# Patient Record
Sex: Male | Born: 1985
Health system: Southern US, Community
[De-identification: ages and names within clinical notes are randomized; demographics above are authoritative.]

## PROBLEM LIST (undated history)

## (undated) DIAGNOSIS — I10 Essential (primary) hypertension: Secondary | ICD-10-CM

## (undated) HISTORY — DX: Essential (primary) hypertension: I10

---

## 2013-11-25 ENCOUNTER — Emergency Department (HOSPITAL_BASED_OUTPATIENT_CLINIC_OR_DEPARTMENT_OTHER)
Admission: EM | Admit: 2013-11-25 | Discharge: 2013-11-25 | Disposition: A | Discharge status: Home / Self Care | Payer: Self-pay | Attending: Emergency Medicine | Admitting: Emergency Medicine

## 2013-11-25 ENCOUNTER — Encounter (HOSPITAL_BASED_OUTPATIENT_CLINIC_OR_DEPARTMENT_OTHER): Payer: Self-pay | Admitting: Emergency Medicine

## 2013-11-25 DIAGNOSIS — H109 Unspecified conjunctivitis: Secondary | ICD-10-CM | POA: Insufficient documentation

## 2013-11-25 DIAGNOSIS — F17200 Nicotine dependence, unspecified, uncomplicated: Secondary | ICD-10-CM | POA: Insufficient documentation

## 2013-11-25 MED ORDER — CIPROFLOXACIN HCL 0.3 % OP SOLN
1.0000 [drp] | OPHTHALMIC | Status: DC
  Administered 2013-11-25: 1 [drp] via OPHTHALMIC
  Filled 2013-11-25: qty 2.5

## 2013-11-25 MED ORDER — ERYTHROMYCIN 5 MG/GM OP OINT
TOPICAL_OINTMENT | Freq: Every day | OPHTHALMIC | Status: DC
  Administered 2013-11-25: 13:00:00 via OPHTHALMIC
  Filled 2013-11-25: qty 3.5

## 2013-11-25 MED ORDER — TETRACAINE HCL 0.5 % OP SOLN: 1.0000 [drp] | Freq: Once | OPHTHALMIC | Status: DC

## 2013-11-25 MED ORDER — FLUORESCEIN SODIUM 1 MG OP STRP
1.0000 | ORAL_STRIP | Freq: Once | OPHTHALMIC | Status: AC
  Administered 2013-11-25: 1 via OPHTHALMIC
  Filled 2013-11-25: qty 1

## 2013-11-25 MED ORDER — FLUORESCEIN SODIUM 1 MG OP STRP: 1.0000 | ORAL_STRIP | Freq: Once | OPHTHALMIC | Status: DC

## 2013-11-25 MED ORDER — TETRACAINE HCL 0.5 % OP SOLN
1.0000 [drp] | Freq: Once | OPHTHALMIC | Status: AC
  Administered 2013-11-25: 1 [drp] via OPHTHALMIC
  Filled 2013-11-25: qty 2

## 2013-11-25 NOTE — Discharge Instructions (Signed)

## 2013-11-25 NOTE — ED Notes (Signed)
Patient began with reddness and swelling in his right eye on Monday, that has now spread to his left eye. Both eyes swollen, red with clear drainage. Right eye worse symptoms than the left

## 2013-11-25 NOTE — ED Provider Notes (Addendum)
CSN: [id number]     Arrival date & time 11/25/13  1133 History   First MD Initiated Contact with Patient 11/25/13 1156     Chief Complaint  Patient presents with  . Conjunctivitis     (Consider location/radiation/quality/duration/timing/severity/associated sxs/prior Treatment) Patient is a 28 y.o. male presenting with conjunctivitis. The history is provided by the patient.  Conjunctivitis This is a new problem. Episode onset: 6 days ago. The problem occurs constantly. The problem has been gradually worsening. Associated symptoms comments: Started with pain and irritation to the right eye with watering and matting and then spread to left eye yesterday.  Does not wear contacts and vision has not changed.. Nothing aggravates the symptoms. Nothing relieves the symptoms. He has tried nothing for the symptoms. The treatment provided no relief.    History reviewed. No pertinent past medical history. History reviewed. No pertinent past surgical history. No family history on file. History  Substance Use Topics  . Smoking status: Current Every Day Smoker  . Smokeless tobacco: Not on file  . Alcohol Use: No    Review of Systems  Eyes: Positive for pain, discharge, redness and itching. Negative for visual disturbance.  All other systems reviewed and are negative.      Allergies  Review of patient's allergies indicates no known allergies.  Home Medications  No current outpatient prescriptions on file. BP 144/98  Pulse 82  Temp(Src) 98.4 F (36.9 C) (Oral)  Resp 18  Ht 5\' 6"  (1.676 m)  Wt 170 lb (77.111 kg)  BMI 27.45 kg/m2  SpO2 100% Physical Exam  Nursing note and vitals reviewed. Constitutional: He is oriented to person, place, and time. He appears well-developed and well-nourished. No distress.  HENT:  Head: Normocephalic and atraumatic.  Eyes: EOM are normal. Pupils are equal, round, and reactive to light. Right eye exhibits discharge and exudate. No foreign body present in  the right eye. Left eye exhibits discharge and exudate. Right conjunctiva is injected. Right conjunctiva has no hemorrhage. Left conjunctiva is injected. Left conjunctiva has no hemorrhage.  Slit lamp exam:      The right eye shows no corneal abrasion, no corneal flare, no corneal ulcer and no foreign body.  Right upper lid with swelling  Cardiovascular: Normal rate.   Pulmonary/Chest: Effort normal.  Neurological: He is alert and oriented to person, place, and time.  Skin: Skin is warm and dry.  Psychiatric: He has a normal mood and affect. His behavior is normal.    ED Course  Procedures (including critical care time) Labs Review Labs Reviewed - No data to display Imaging Review No results found.   EKG Interpretation None      MDM   Final diagnoses:  Conjunctivitis    Patient with evidence of bilateral conjunctivitis without complicating features. Patient does not wear contacts  and otherwise has normal vision. He was started on antibiotic drops Hemoccult with ophthalmology if symptoms do not improve.    Gwyneth SproutWhitney Avyanna Spada, MD 11/25/13 1227  Gwyneth SproutWhitney Neeva Trew, MD 11/25/13 1229

## 2013-12-01 ENCOUNTER — Emergency Department (HOSPITAL_BASED_OUTPATIENT_CLINIC_OR_DEPARTMENT_OTHER): Payer: Self-pay

## 2013-12-01 ENCOUNTER — Encounter (HOSPITAL_BASED_OUTPATIENT_CLINIC_OR_DEPARTMENT_OTHER): Payer: Self-pay | Admitting: Emergency Medicine

## 2013-12-01 ENCOUNTER — Emergency Department (HOSPITAL_BASED_OUTPATIENT_CLINIC_OR_DEPARTMENT_OTHER)
Admission: EM | Admit: 2013-12-01 | Discharge: 2013-12-01 | Disposition: A | Discharge status: Home / Self Care | Payer: Self-pay | Attending: Emergency Medicine | Admitting: Emergency Medicine

## 2013-12-01 DIAGNOSIS — F17200 Nicotine dependence, unspecified, uncomplicated: Secondary | ICD-10-CM | POA: Insufficient documentation

## 2013-12-01 DIAGNOSIS — S43006A Unspecified dislocation of unspecified shoulder joint, initial encounter: Secondary | ICD-10-CM | POA: Insufficient documentation

## 2013-12-01 DIAGNOSIS — H109 Unspecified conjunctivitis: Secondary | ICD-10-CM | POA: Insufficient documentation

## 2013-12-01 IMAGING — CR DG SHOULDER 2+V*R*
3 series · 3 of 3 positions shown · non-contrast
Comparison: None.

CLINICAL DATA: Shoulder pain and limited range of movement.

EXAM:
RIGHT SHOULDER - 2+ VIEW

[w shoulder ap internal righ]
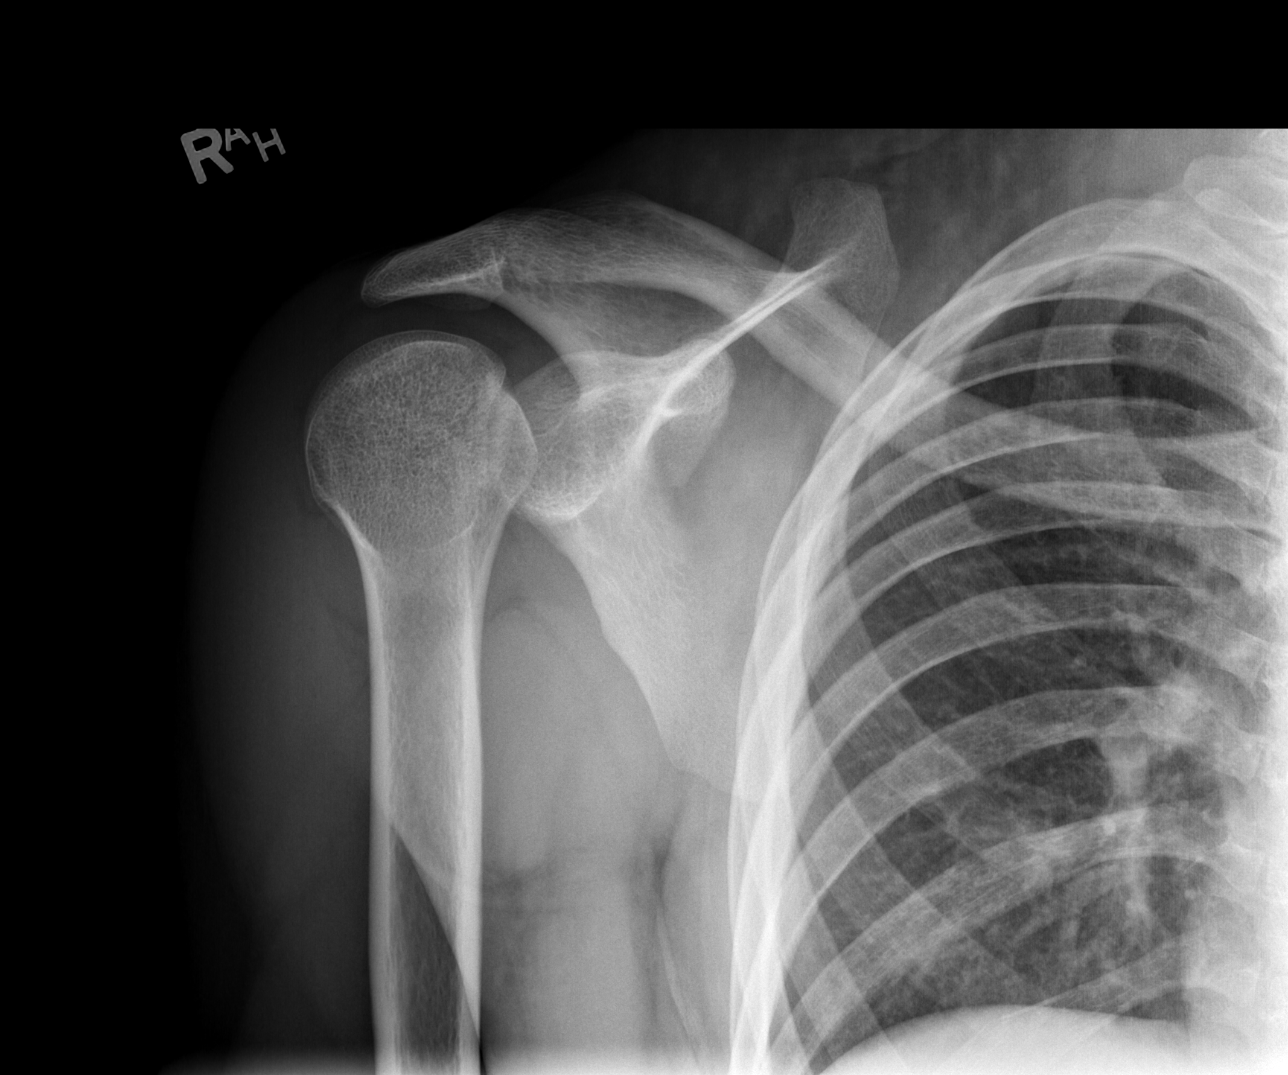

[w shoulder ap external righ (1 of 2)]
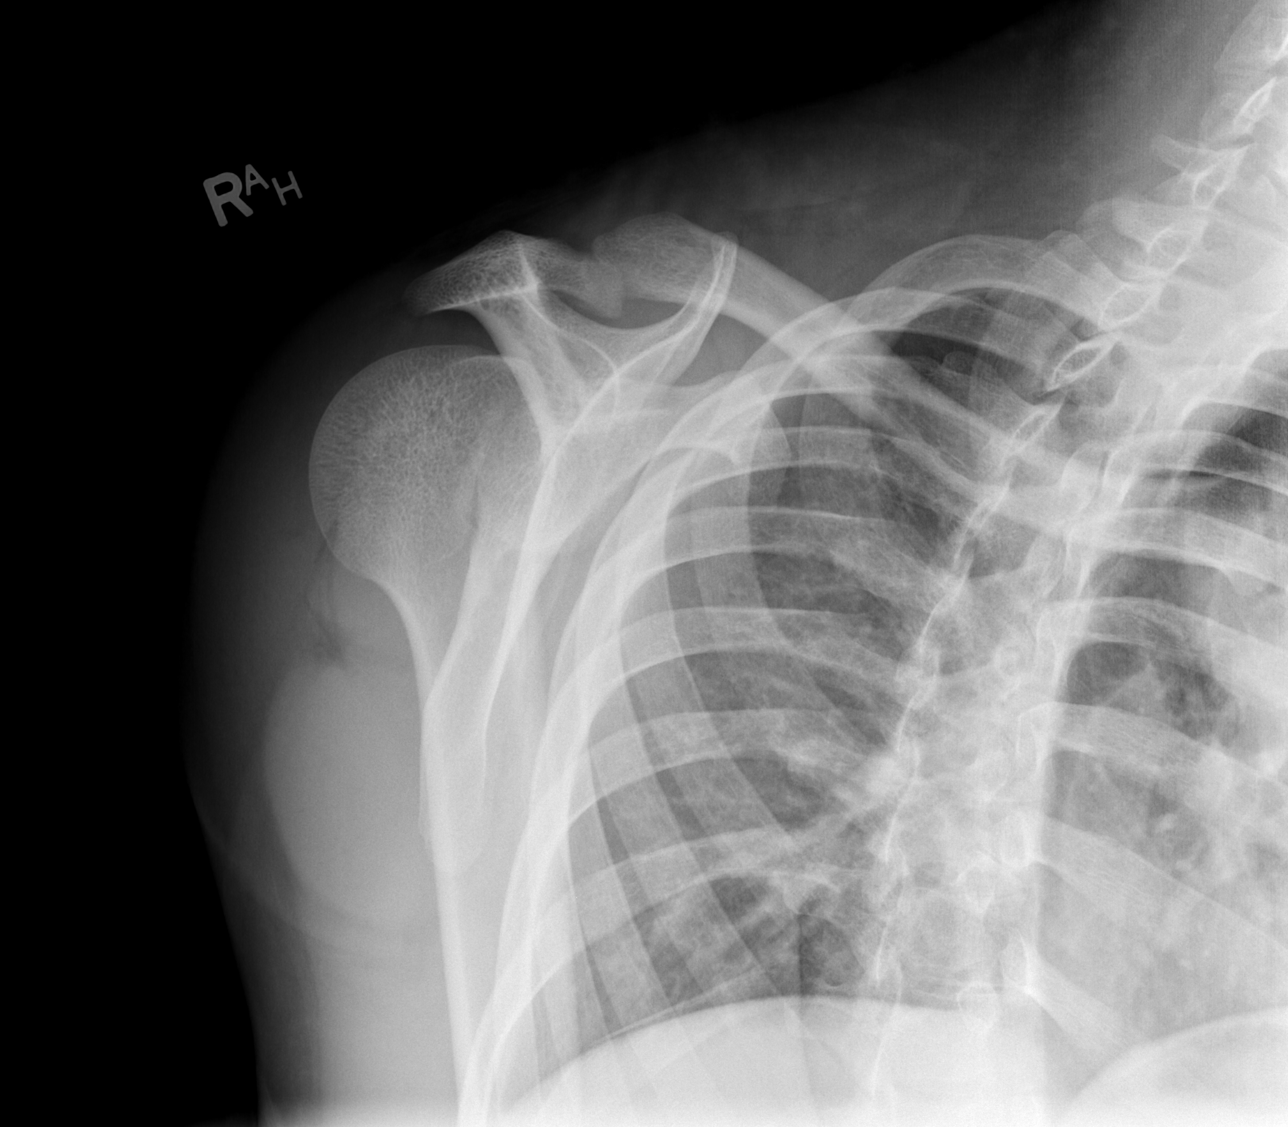

[w shoulder ap external righ (2 of 2)]
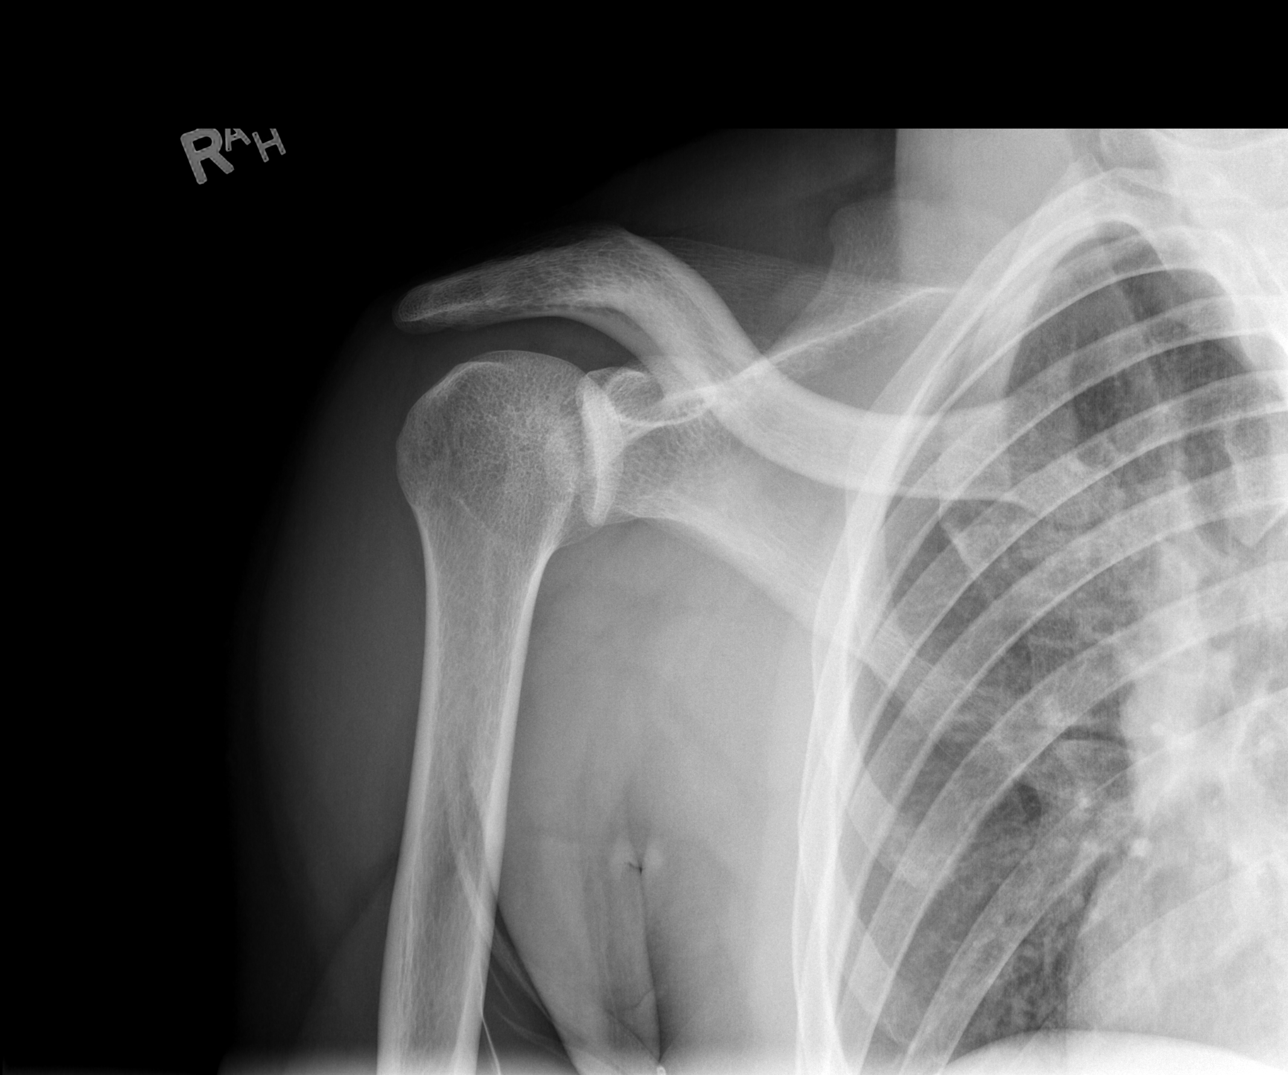

[3 of 3 positions shown; findings below may reference images not displayed]

FINDINGS: The humeral head is posteriorly dislocated. There is likely a
reverse Hill-Sachs lesion from impaction on the glenoid. The humerus
is in internal rotation. Acromioclavicular joint is approximated.
IMPRESSION: Posterior glenohumeral dislocation.

## 2013-12-01 IMAGING — CR DG SHOULDER 2+V*R*
2 series · 2 of 2 positions shown · non-contrast
Comparison: Same day.

CLINICAL DATA: Post reduction for dislocation.

EXAM:
RIGHT SHOULDER - 2+ VIEW

[view not recorded (1 of 2)]
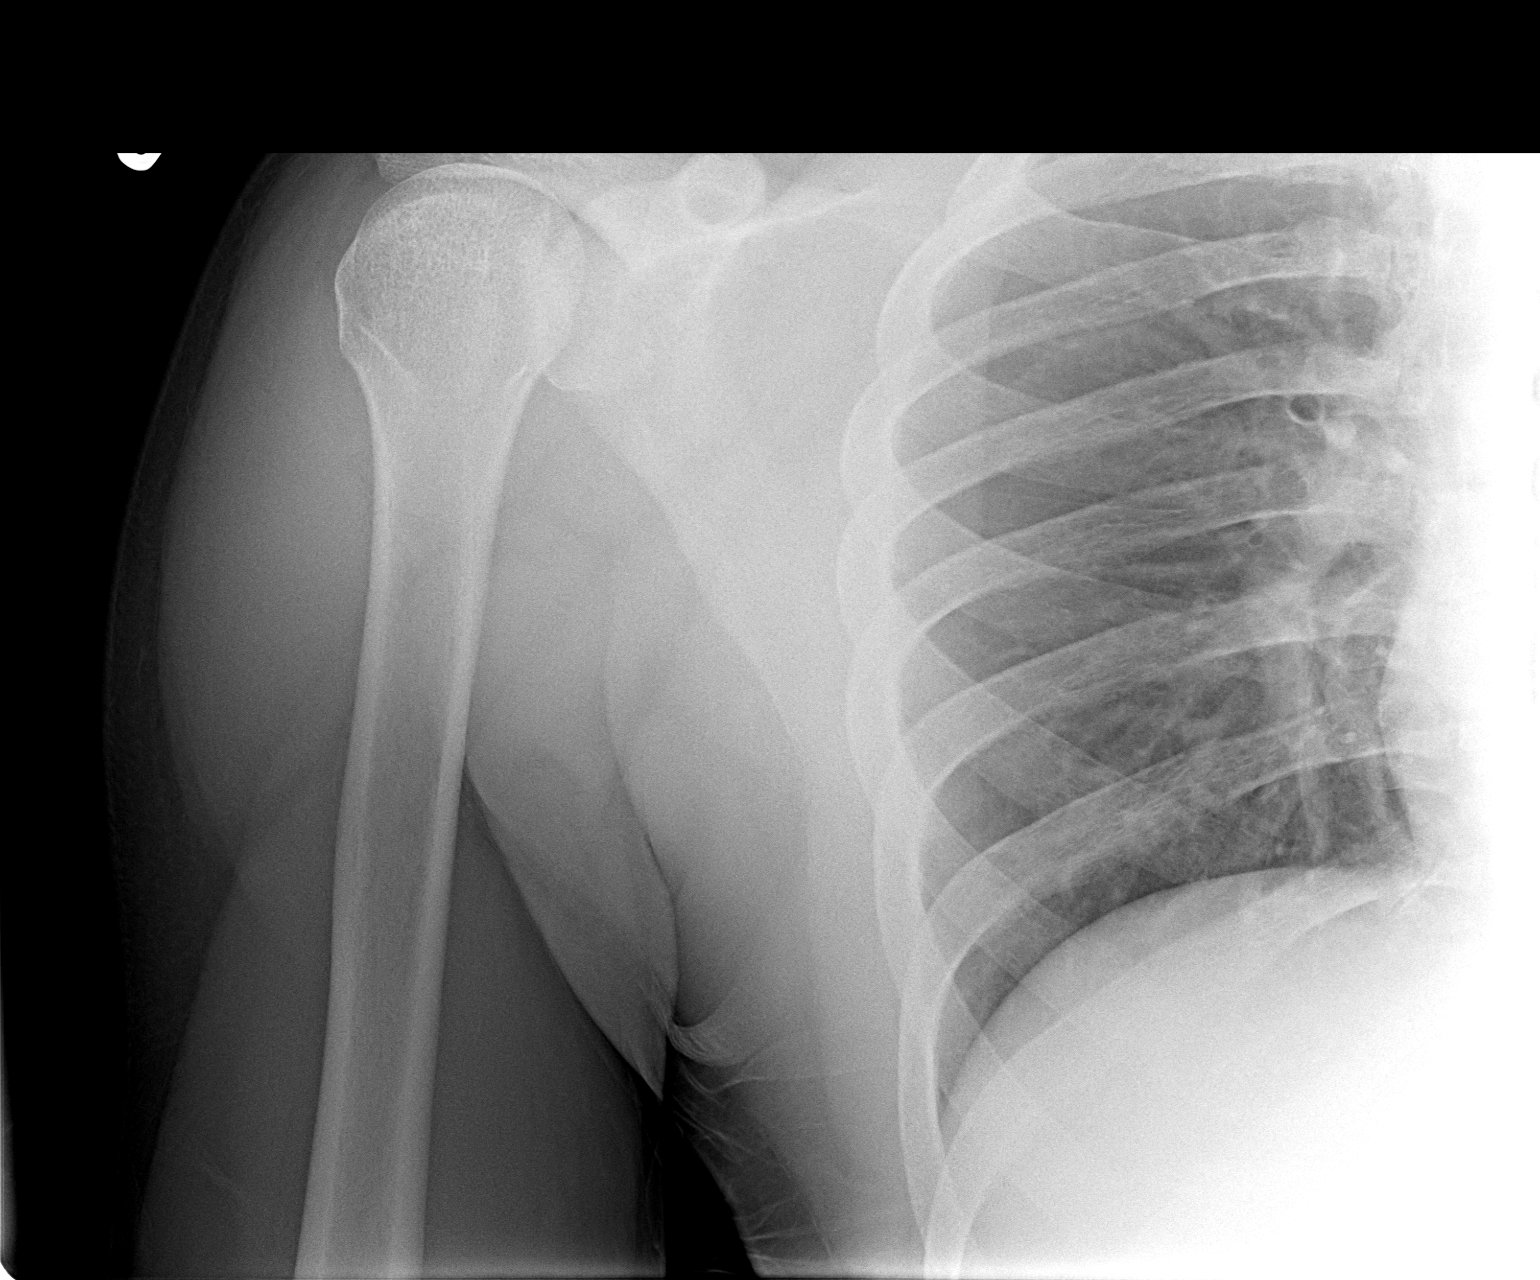

[view not recorded (2 of 2)]
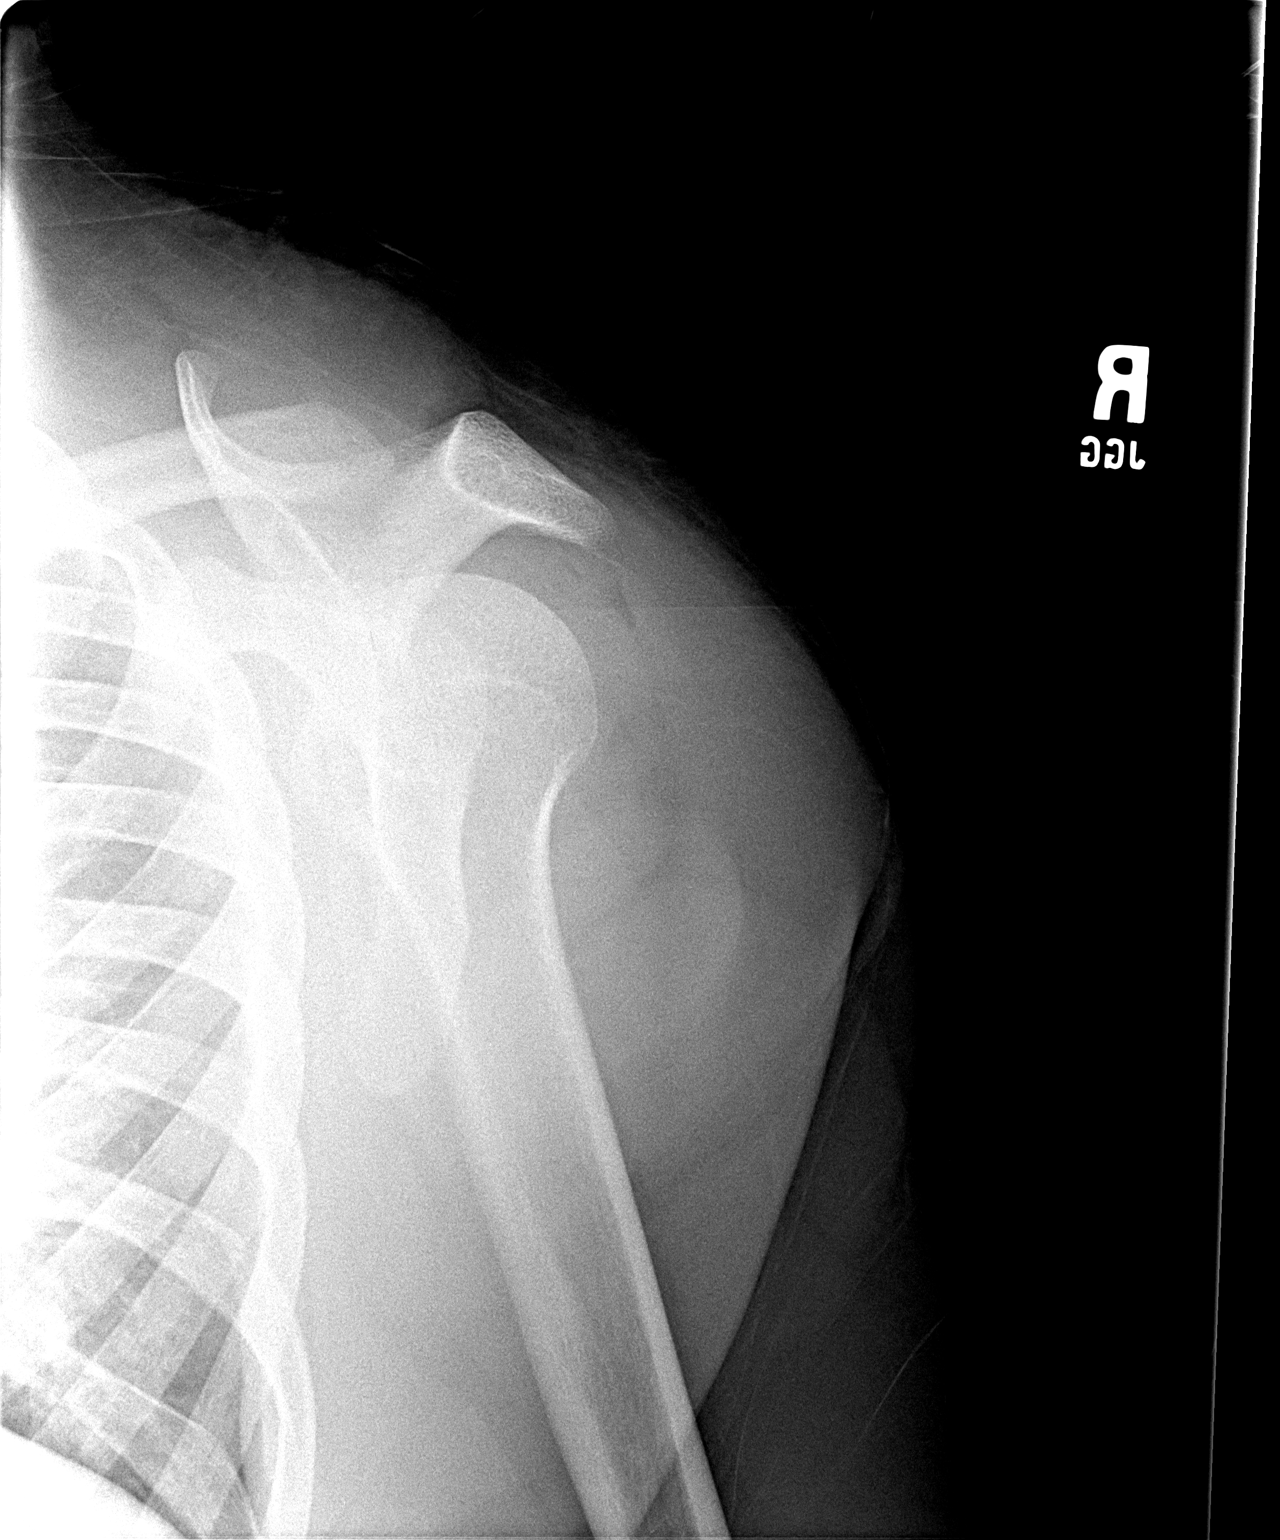

[2 of 2 positions shown; findings below may reference images not displayed]

FINDINGS: There is successful reduction of the previously described posterior
dislocation. No definite fracture is noted. Visualized ribs appear
normal.
IMPRESSION: Successful reduction of previously described posterior dislocation
of right proximal humeral head.

## 2013-12-01 MED ORDER — HYDROMORPHONE HCL PF 1 MG/ML IJ SOLN
1.0000 mg | Freq: Once | INTRAMUSCULAR | Status: AC
  Administered 2013-12-01: 1 mg via INTRAVENOUS
  Filled 2013-12-01: qty 1

## 2013-12-01 MED ORDER — ERYTHROMYCIN 5 MG/GM OP OINT
TOPICAL_OINTMENT | Freq: Four times a day (QID) | OPHTHALMIC | Status: DC
  Administered 2013-12-01: 1 via OPHTHALMIC
  Filled 2013-12-01: qty 3.5

## 2013-12-01 MED ORDER — IBUPROFEN 800 MG PO TABS: 800.0000 mg | ORAL_TABLET | Freq: Three times a day (TID) | ORAL | Status: DC

## 2013-12-01 MED ORDER — HYDROCODONE-ACETAMINOPHEN 5-325 MG PO TABS: 1.0000 | ORAL_TABLET | ORAL | Status: DC | PRN

## 2013-12-01 MED ORDER — PROPOFOL 10 MG/ML IV BOLUS
35.0000 mg | INTRAVENOUS | Status: DC | PRN
  Administered 2013-12-01: 35 mg via INTRAVENOUS
  Filled 2013-12-01: qty 20
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
## 2013-12-01 NOTE — ED Provider Notes (Signed)
Patient presented to the emergency room for evaluation of right shoulder injury. The patient was seen initially by PA Upstill.  X-rays demonstrated a posterior shoulder dislocation. Patient denies any active medical problems. He has no history of heart or lung disease. He's not having any difficulty with his breathing. No chest pain Physical Exam  BP 135/89  Pulse 89  Temp(Src) 98 F (36.7 C) (Oral)  Resp 24  Ht 5\' 7"  (1.702 m)  Wt 170 lb (77.111 kg)  BMI 26.62 kg/m2  SpO2 100%  Physical Exam  Nursing note and vitals reviewed. Constitutional: He appears well-developed and well-nourished. No distress.  HENT:  Head: Normocephalic and atraumatic.  Right Ear: External ear normal.  Left Ear: External ear normal.  Mouth/Throat: No oropharyngeal exudate.  Eyes: Conjunctivae are normal. Right eye exhibits no discharge. Left eye exhibits no discharge. No scleral icterus.  Neck: Neck supple. No tracheal deviation present.  Cardiovascular: Normal rate and regular rhythm.  Exam reveals no friction rub.   Pulmonary/Chest: Effort normal and breath sounds normal. No stridor. No respiratory distress. He has no wheezes.  Musculoskeletal: He exhibits no edema.  Neurological: He is alert. Cranial nerve deficit: no gross deficits.  Skin: Skin is warm and dry. No rash noted.  Psychiatric: He has a normal mood and affect.    ED Course  Reduction of dislocation Date/Time: 12/01/2013 4:42 PM Performed by: Linwood Dibbles R Authorized by: Linwood Dibbles R Consent: Verbal consent obtained. Risks and benefits: risks, benefits and alternatives were discussed Consent given by: patient Local anesthesia used: no Patient sedated: yes Patient tolerance: Patient tolerated the procedure well with no immediate complications. Comments: Traction counter traction with posterior pressure on the proximal humerus.  Palpable reduction felt.  Tolerated well.  Procedural sedation Date/Time: 12/01/2013 4:43 PM Performed by:  Linwood Dibbles R Authorized by: Linwood Dibbles R Consent: Verbal consent obtained. Risks and benefits: risks, benefits and alternatives were discussed Consent given by: patient Patient sedated: yes Sedatives: propofol Sedation end date/time: 12/01/2013 4:43 PM Comments: See nursing record for start and stop times.  Approximately 15 minutes of bedside time.  Pt was awake and speaking to me at the end of the procedure.  Mallampati class I. ASA category 1.  See nursing sedation notes for medication information.  Propofol used in 0.5 mg/kg aliquots. Dg Shoulder Right  12/01/2013   CLINICAL DATA:  Post reduction for dislocation.  EXAM: RIGHT SHOULDER - 2+ VIEW  COMPARISON:  Same day.  FINDINGS: There is successful reduction of the previously described posterior dislocation. No definite fracture is noted. Visualized ribs appear normal.  IMPRESSION: Successful reduction of previously described posterior dislocation of right proximal humeral head.   Electronically Signed   By: Roque Lias M.D.   On: 12/01/2013 16:59   Dg Shoulder Right  12/01/2013   CLINICAL DATA:  Shoulder pain and limited range of movement.  EXAM: RIGHT SHOULDER - 2+ VIEW  COMPARISON:  None.  FINDINGS: The humeral head is posteriorly dislocated. There is likely a reverse Hill-Sachs lesion from impaction on the glenoid. The humerus is in internal rotation. Acromioclavicular joint is approximated.  IMPRESSION: Posterior glenohumeral dislocation.   Electronically Signed   By: Sebastian Ache   On: 12/01/2013 15:14    MDM Post reduction films showed successful reduction.  Will dc home with ortho referral.  Medical screening examination/treatment/procedure(s) were conducted as a shared visit with non-physician practitioner(s) and myself.  I personally evaluated the patient during the encounter.  Celene KrasJon R Jenavee Laguardia, MD 12/01/13 [phone]

## 2013-12-01 NOTE — ED Notes (Signed)
Pt had a vagal episode at triage. Brought straight back to room 8. IV access. Xray ordered. Pt is laying flat. Skin is less diaphoretic after laying down. Able to talk on cell phone.
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
## 2013-12-01 NOTE — Discharge Instructions (Signed)
Shoulder Dislocation  Your shoulder is made up of three bones: the collar bone (clavicle); the shoulder blade (scapula), which includes the socket (glenoid cavity); and the upper arm bone (humerus). Your shoulder joint is the place where these bones meet. Strong, fibrous tissues hold these bones together (ligaments). Muscles and strong, fibrous tissues that connect the muscles to these bones (tendons) allow your arm to move through this joint. The range of motion of your shoulder joint is more extensive than most of your other joints, and the glenoid cavity is very shallow. That is the reason that your shoulder joint is one of the most unstable joints in your body. It is far more prone to dislocation than your other joints. Shoulder dislocation is when your humerus is forced out of your shoulder joint.  CAUSES  Shoulder dislocation is caused by a forceful impact on your shoulder. This impact usually is from an injury, such as a sports injury or a fall.  SYMPTOMS  Symptoms of shoulder dislocation include:   Deformity of your shoulder.   Intense pain.   Inability to move your shoulder joint.   Numbness, weakness, or tingling around your shoulder joint (your neck or down your arm).   Bruising or swelling around your shoulder.  DIAGNOSIS  In order to diagnose a dislocated shoulder, your caregiver will perform a physical exam. Your caregiver also may have an X-ray exam done to see if you have any broken bones. Magnetic resonance imaging (MRI) is a procedure that sometimes is done to help your caregiver see any damage to the soft tissues around your shoulder, particularly your rotator cuff tendons. Additionally, your caregiver also may have electromyography done to measure the electrical discharges produced in your muscles if you have signs or symptoms of nerve damage.  TREATMENT  A shoulder dislocation is treated by placing the humerus back in the joint (reduction). Your caregiver does this either manually (closed  reduction), by moving your humerus back into the joint through manipulation, or through surgery (open reduction). When your humerus is back in place, severe pain should improve almost immediately.  You also may need to have surgery if you have a weak shoulder joint or ligaments, and you have recurring shoulder dislocations, despite rehabilitation. In rare cases, surgery is necessary if your nerves or blood vessels are damaged during the dislocation.  After your reduction, your arm will be placed in a shoulder immobilizer or sling to keep it from moving. Your caregiver will have you wear your shoulder immoblizer or sling for 3 days to 3 weeks, depending on how serious your dislocation is. When your shoulder immobilizer or sling is removed, your caregiver may prescribe physical therapy to help improve the range of motion in your shoulder joint.  HOME CARE INSTRUCTIONS   The following measures can help to reduce pain and speed up the healing process:   Rest your injured joint. Do not move it. Avoid activities similar to the one that caused your injury.   Apply ice to your injured joint for the first day or two after your reduction or as directed by your caregiver. Applying ice helps to reduce inflammation and pain.   Put ice in a plastic bag.   Place a towel between your skin and the bag.   Leave the ice on for 15-20 minutes at a time, every 2 hours while you are awake.   Exercise your hand by squeezing a soft ball. This helps to eliminate stiffness and swelling in your hand and   shoulder immobilizer or sling becomes damaged.  Your pain becomes worse rather than better.  You lose feeling in your arm or hand, or they become white and cold. MAKE SURE YOU:   Understand these instructions.  Will watch your condition.  Will get help right away if you are not  doing well or get worse. Document Released: 05/05/2001 Document Revised: 11/02/2011 Document Reviewed: 05/31/2011 [facility] Patient Information 2014 [place], [place]. Cryotherapy Cryotherapy means treatment with cold. Ice or gel packs can be used to reduce both pain and swelling. Ice is the most helpful within the first 24 to 48 hours after an injury or flareup from overusing a muscle or joint. Sprains, strains, spasms, burning pain, shooting pain, and aches can all be eased with ice. Ice can also be used when recovering from surgery. Ice is effective, has very few side effects, and is safe for most people to use. PRECAUTIONS  Ice is not a safe treatment option for people with:  Raynaud's phenomenon. This is a condition affecting small blood vessels in the extremities. Exposure to cold may cause your problems to return.  Cold hypersensitivity. There are many forms of cold hypersensitivity, including:  Cold urticaria. Red, itchy hives appear on the skin when the tissues begin to warm after being iced.  Cold erythema. This is a red, itchy rash caused by exposure to cold.  Cold hemoglobinuria. Red blood cells break down when the tissues begin to warm after being iced. The hemoglobin that carry oxygen are passed into the urine because they cannot combine with blood proteins fast enough.  Numbness or altered sensitivity in the area being iced. If you have any of the following conditions, do not use ice until you have discussed cryotherapy with your caregiver:  Heart conditions, such as arrhythmia, angina, or chronic heart disease.  High blood pressure.  Healing wounds or open skin in the area being iced.  Current infections.  Rheumatoid arthritis.  Poor circulation.  Diabetes. Ice slows the blood flow in the region it is applied. This is beneficial when trying to stop inflamed tissues from spreading irritating chemicals to surrounding tissues. However, if you expose your skin to cold  temperatures for too long or without the proper protection, you can damage your skin or nerves. Watch for signs of skin damage due to cold. HOME CARE INSTRUCTIONS Follow these tips to use ice and cold packs safely.  Place a dry or damp towel between the ice and skin. A damp towel will cool the skin more quickly, so you may need to shorten the time that the ice is used.  For a more rapid response, add gentle compression to the ice.  Ice for no more than 10 to 20 minutes at a time. The bonier the area you are icing, the less time it will take to get the benefits of ice.  Check your skin after 5 minutes to make sure there are no signs of a poor response to cold or skin damage.  Rest 20 minutes or more in between uses.  Once your skin is numb, you can end your treatment. You can test numbness by very lightly touching your skin. The touch should be so light that you do not see the skin dimple from the pressure of your fingertip. When using ice, most people will feel these normal sensations in this order: cold, burning, aching, and numbness.  Do not use ice on someone who cannot communicate their responses to pain, such as small children or people  with dementia. HOW TO MAKE AN ICE PACK Ice packs are the most common way to use ice therapy. Other methods include ice massage, ice baths, and cryo-sprays. Muscle creams that cause a cold, tingly feeling do not offer the same benefits that ice offers and should not be used as a substitute unless recommended by your caregiver. To make an ice pack, do one of the following:  Place crushed ice or a bag of frozen vegetables in a sealable plastic bag. Squeeze out the excess air. Place this bag inside another plastic bag. Slide the bag into a pillowcase or place a damp towel between your skin and the bag.  Mix 3 parts water with 1 part rubbing alcohol. Freeze the mixture in a sealable plastic bag. When you remove the mixture from the freezer, it will be slushy.  Squeeze out the excess air. Place this bag inside another plastic bag. Slide the bag into a pillowcase or place a damp towel between your skin and the bag. SEEK MEDICAL CARE IF:  You develop white spots on your skin. This may give the skin a blotchy (mottled) appearance.  Your skin turns blue or pale.  Your skin becomes waxy or hard.  Your swelling gets worse. MAKE SURE YOU:   Understand these instructions.  Will watch your condition.  Will get help right away if you are not doing well or get worse. Document Released: 04/06/2011 Document Revised: 11/02/2011 Document Reviewed: 04/06/2011 [facility] Patient Information 2014 [place], [place].  Bacterial Conjunctivitis Bacterial conjunctivitis, commonly called pink eye, is an inflammation of the clear membrane that covers the white part of the eye (conjunctiva). The inflammation can also happen on the underside of the eyelids. The blood vessels in the conjunctiva become inflamed causing the eye to become red or pink. Bacterial conjunctivitis may spread easily from one eye to another and from person to person (contagious).  CAUSES  Bacterial conjunctivitis is caused by bacteria. The bacteria may come from your own skin, your upper respiratory tract, or from someone else with bacterial conjunctivitis. SYMPTOMS  The normally white color of the eye or the underside of the eyelid is usually pink or red. The pink eye is usually associated with irritation, tearing, and some sensitivity to light. Bacterial conjunctivitis is often associated with a thick, yellowish discharge from the eye. The discharge may turn into a crust on the eyelids overnight, which causes your eyelids to stick together. If a discharge is present, there may also be some blurred vision in the affected eye. DIAGNOSIS  Bacterial conjunctivitis is diagnosed by your caregiver through an eye exam and the symptoms that you report. Your caregiver looks for changes in the surface tissues of  your eyes, which may point to the specific type of conjunctivitis. A sample of any discharge may be collected on a cotton-tip swab if you have a severe case of conjunctivitis, if your cornea is affected, or if you keep getting repeat infections that do not respond to treatment. The sample will be sent to a lab to see if the inflammation is caused by a bacterial infection and to see if the infection will respond to antibiotic medicines. TREATMENT   Bacterial conjunctivitis is treated with antibiotics. Antibiotic eyedrops are most often used. However, antibiotic ointments are also available. Antibiotics pills are sometimes used. Artificial tears or eye washes may ease discomfort. HOME CARE INSTRUCTIONS   To ease discomfort, apply a cool, clean wash cloth to your eye for 10 20 minutes, 3 4 times a  day.  Gently wipe away any drainage from your eye with a warm, wet washcloth or a cotton ball.  Wash your hands often with soap and water. Use paper towels to dry your hands.  Do not share towels or wash cloths. This may spread the infection.  Change or wash your pillow case every day.  You should not use eye makeup until the infection is gone.  Do not operate machinery or drive if your vision is blurred.  Stop using contacts lenses. Ask your caregiver how to sterilize or replace your contacts before using them again. This depends on the type of contact lenses that you use.  When applying medicine to the infected eye, do not touch the edge of your eyelid with the eyedrop bottle or ointment tube. SEEK IMMEDIATE MEDICAL CARE IF:   Your infection has not improved within 3 days after beginning treatment.  You had yellow discharge from your eye and it returns.  You have increased eye pain.  Your eye redness is spreading.  Your vision becomes blurred.  You have a fever or persistent symptoms for more than 2 3 days.  You have a fever and your symptoms suddenly get worse.  You have facial pain,  redness, or swelling. MAKE SURE YOU:   Understand these instructions.  Will watch your condition.  Will get help right away if you are not doing well or get worse. Document Released: 08/10/2005 Document Revised: 05/04/2012 Document Reviewed: 01/11/2012 [facility] Patient Information 2014 [place], [place].

## 2013-12-01 NOTE — Sedation Documentation (Signed)
Procedure started at 1620.  4 doses of propofol 35mg  given in succession.  Rt shoulder manipulated and reduced and pt tolerated well.Procedure ended at 1632.  Pt fully awake at 1640.  Taken to xray with nurse.  1650 given po flds and tolerated well. Shoulder immobilizer applied.  Good radial pulse present

## 2013-12-06 ENCOUNTER — Encounter (HOSPITAL_BASED_OUTPATIENT_CLINIC_OR_DEPARTMENT_OTHER): Payer: Self-pay | Admitting: Emergency Medicine

## 2013-12-06 ENCOUNTER — Emergency Department (HOSPITAL_BASED_OUTPATIENT_CLINIC_OR_DEPARTMENT_OTHER)
Admission: EM | Admit: 2013-12-06 | Discharge: 2013-12-06 | Disposition: A | Discharge status: Home / Self Care | Payer: Self-pay | Attending: Emergency Medicine | Admitting: Emergency Medicine

## 2013-12-06 DIAGNOSIS — H1045 Other chronic allergic conjunctivitis: Secondary | ICD-10-CM | POA: Insufficient documentation

## 2013-12-06 DIAGNOSIS — F17200 Nicotine dependence, unspecified, uncomplicated: Secondary | ICD-10-CM | POA: Insufficient documentation

## 2013-12-06 DIAGNOSIS — Z791 Long term (current) use of non-steroidal anti-inflammatories (NSAID): Secondary | ICD-10-CM | POA: Insufficient documentation

## 2013-12-06 DIAGNOSIS — H1013 Acute atopic conjunctivitis, bilateral: Secondary | ICD-10-CM

## 2013-12-06 MED ORDER — OLOPATADINE HCL 0.2 % OP SOLN: 1.0000 [drp] | Freq: Every day | OPHTHALMIC | Status: DC
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
## 2013-12-06 NOTE — ED Provider Notes (Signed)
Patient seen/examined in the Emergency Department in conjunction with Resident Physician Provider Aundria Rudogers Patient reports blurred vision and frequent watery eyes after starting erythromycin ointment Exam : awake/alert, conjunctival erythema.  Visual acuity noted Plan: he will call and f/u with ophtho dr Vonna Kotykbevis within 24-48 hours.  Could be allergic in nature   Joya Gaskinsonald W Jacaria Colburn, MD 12/06/13 [phone]

## 2013-12-06 NOTE — ED Provider Notes (Signed)
I have personally seen and examined the patient.  I have discussed the plan of care with the resident.  I have reviewed the documentation on PMH/FH/Soc. History.  I have reviewed the documentation of the resident and agree.   Efstathios Sawin W Nazario Russom, MD 12/06/13 1108 

## 2013-12-06 NOTE — ED Notes (Signed)
Pt sts he was being treated for "pink eye" and sts his vision is getting blurry now and feels like his eyes are "worse".

## 2013-12-06 NOTE — Discharge Instructions (Signed)
Please follow- up with Dr. Darel HongBeavis (ophthalmologist) in the next couple of days.   You should STOP using erythromycin ointment as it appears that bacterial component has resolved.  Using ointment during day can cause blurring of vision and in addition you may be allergic to the ointment itself.   We are prescribing you PATADAY eye drops for allergic conjunctivitis, use one drop in each eye daily.  You can also buy over the counter oral allergy medicine such as ZYRTEC to use along with the eye drops.   We gave you a work note for today and tomorrow.   Allergic Conjunctivitis A thin membrane (conjunctiva) covers the eyeball and underside of the eyelids. Allergic conjunctivitis happens when the thin membrane gets irritated from things like animal dander, pollen, perfumes, or smoke (allergens). The membrane may become puffy (swollen) and red. Small bumps may form on the inside of the eyelids. Your eyes may get teary, itchy, or burn. It cannot be passed to another person (contagious).  HOME CARE  Wash your hands before and after applying medicated drops or creams.  Do not touch the drop or cream tube to your eye or eyelids.  Do not use your soft contacts. Throw them away. Use a new pair once recovery is complete.  Do not use your hard contacts. They need to be washed (sterilized) thoroughly after recovery is complete.  Put a cold cloth to your eye(s) if you have itching and burning. GET HELP RIGHT AWAY IF:   You are not feeling better in 2 to 3 days after treatment.  Your lids are sticky or stick together.  Fluid comes from the eye(s).  You become sensitive to light.  You have a temperature by mouth above 102 F (38.9 C).  You have pain in and around the eye(s).  You start to have vision problems. MAKE SURE YOU:   Understand these instructions.  Will watch your condition.  Will get help right away if you are not doing well or get worse. Document Released: 01/28/2010 Document  Revised: 11/02/2011 Document Reviewed: 01/28/2010 [facility] Patient Information 2014 [place], [place].

## 2017-10-20 ENCOUNTER — Other Ambulatory Visit: Payer: Self-pay

## 2017-10-20 ENCOUNTER — Emergency Department (HOSPITAL_BASED_OUTPATIENT_CLINIC_OR_DEPARTMENT_OTHER)
Admission: EM | Admit: 2017-10-20 | Discharge: 2017-10-20 | Disposition: A | Discharge status: Home / Self Care | Payer: Self-pay | Attending: Emergency Medicine | Admitting: Emergency Medicine

## 2017-10-20 ENCOUNTER — Encounter (HOSPITAL_BASED_OUTPATIENT_CLINIC_OR_DEPARTMENT_OTHER): Payer: Self-pay | Admitting: Emergency Medicine

## 2017-10-20 ENCOUNTER — Emergency Department (HOSPITAL_BASED_OUTPATIENT_CLINIC_OR_DEPARTMENT_OTHER): Payer: Self-pay

## 2017-10-20 DIAGNOSIS — X58XXXA Exposure to other specified factors, initial encounter: Secondary | ICD-10-CM | POA: Insufficient documentation

## 2017-10-20 DIAGNOSIS — I10 Essential (primary) hypertension: Secondary | ICD-10-CM | POA: Insufficient documentation

## 2017-10-20 DIAGNOSIS — F17200 Nicotine dependence, unspecified, uncomplicated: Secondary | ICD-10-CM | POA: Insufficient documentation

## 2017-10-20 DIAGNOSIS — Y9389 Activity, other specified: Secondary | ICD-10-CM | POA: Insufficient documentation

## 2017-10-20 DIAGNOSIS — S8391XA Sprain of unspecified site of right knee, initial encounter: Secondary | ICD-10-CM | POA: Insufficient documentation

## 2017-10-20 DIAGNOSIS — Y990 Civilian activity done for income or pay: Secondary | ICD-10-CM | POA: Insufficient documentation

## 2017-10-20 DIAGNOSIS — Y929 Unspecified place or not applicable: Secondary | ICD-10-CM | POA: Insufficient documentation

## 2017-10-20 IMAGING — CR DG KNEE COMPLETE 4+V*R*
4 series · 4 of 4 positions shown · non-contrast
Comparison: None.

CLINICAL DATA: Right knee pain.

EXAM:
RIGHT KNEE - COMPLETE 4+ VIEW

[t knee ap right]
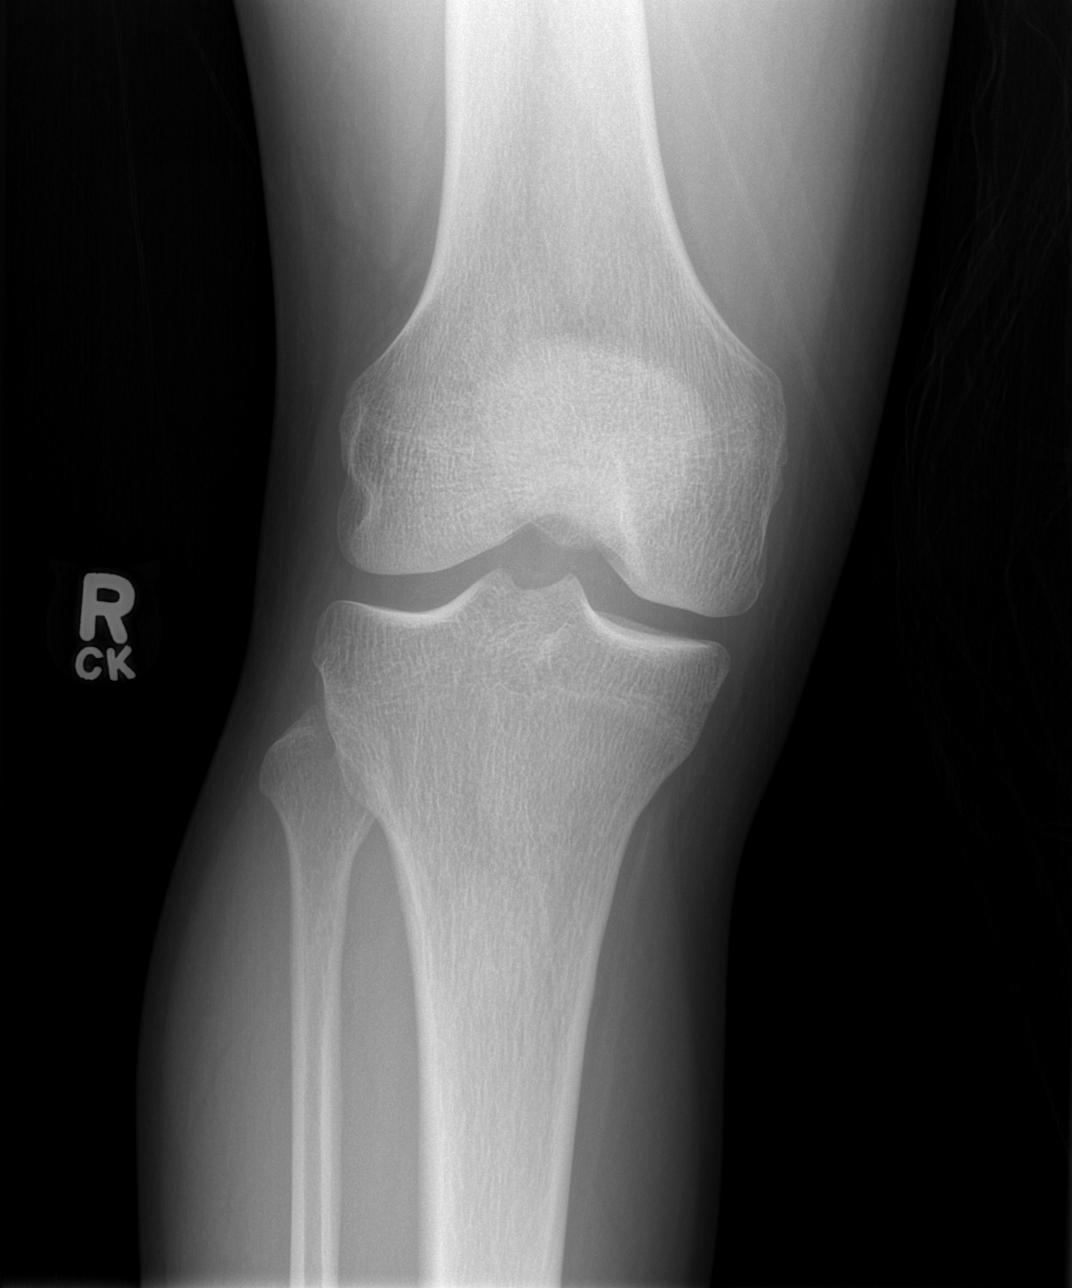

[t knee oblique right (1 of 2)]
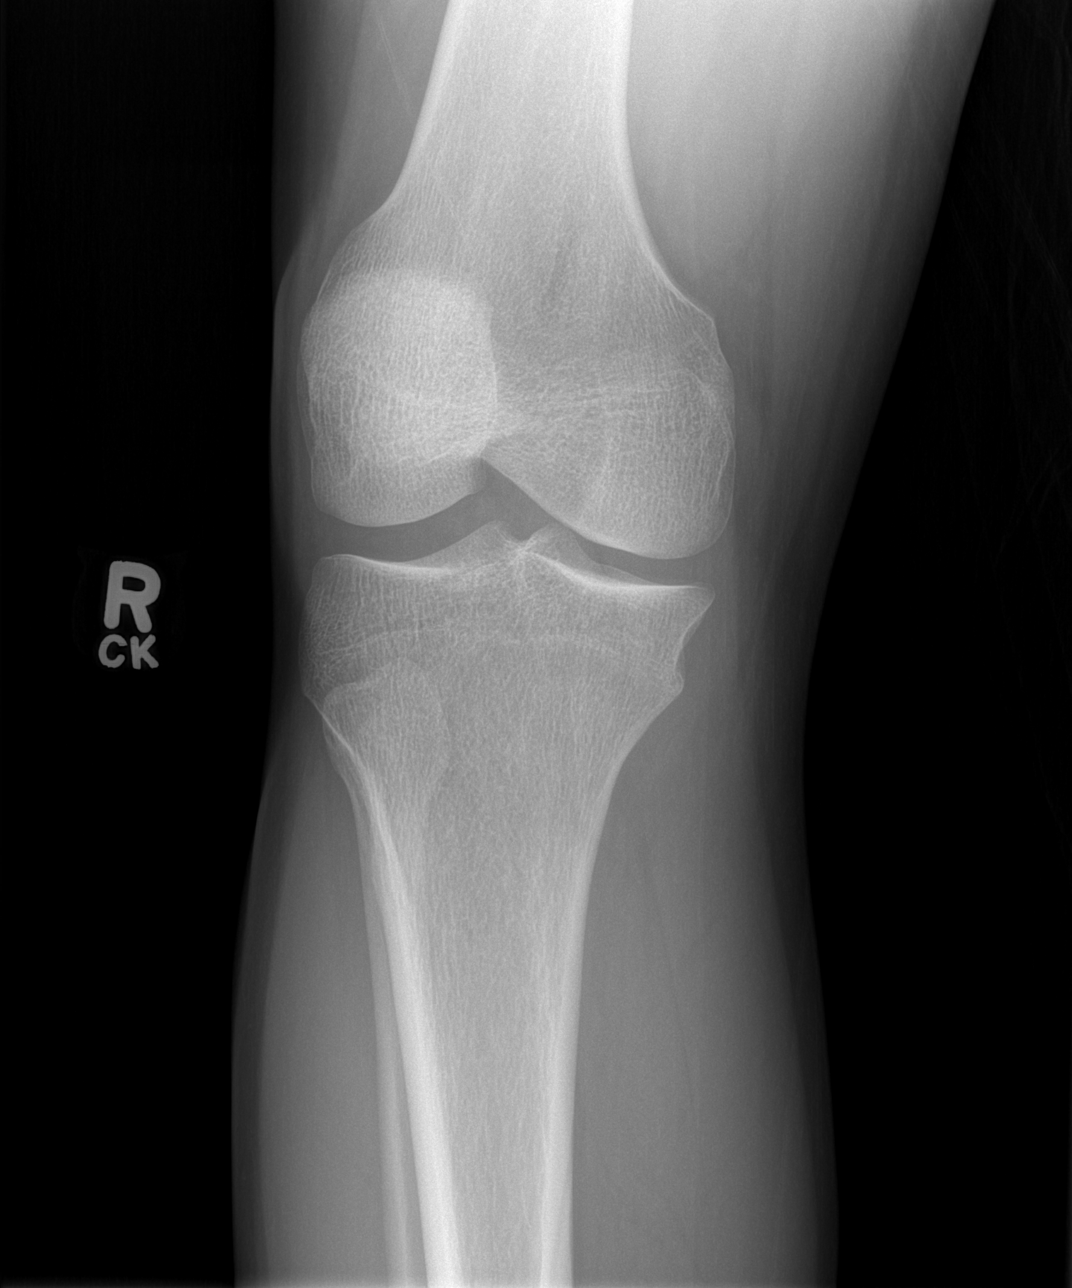

[t knee oblique right (2 of 2)]
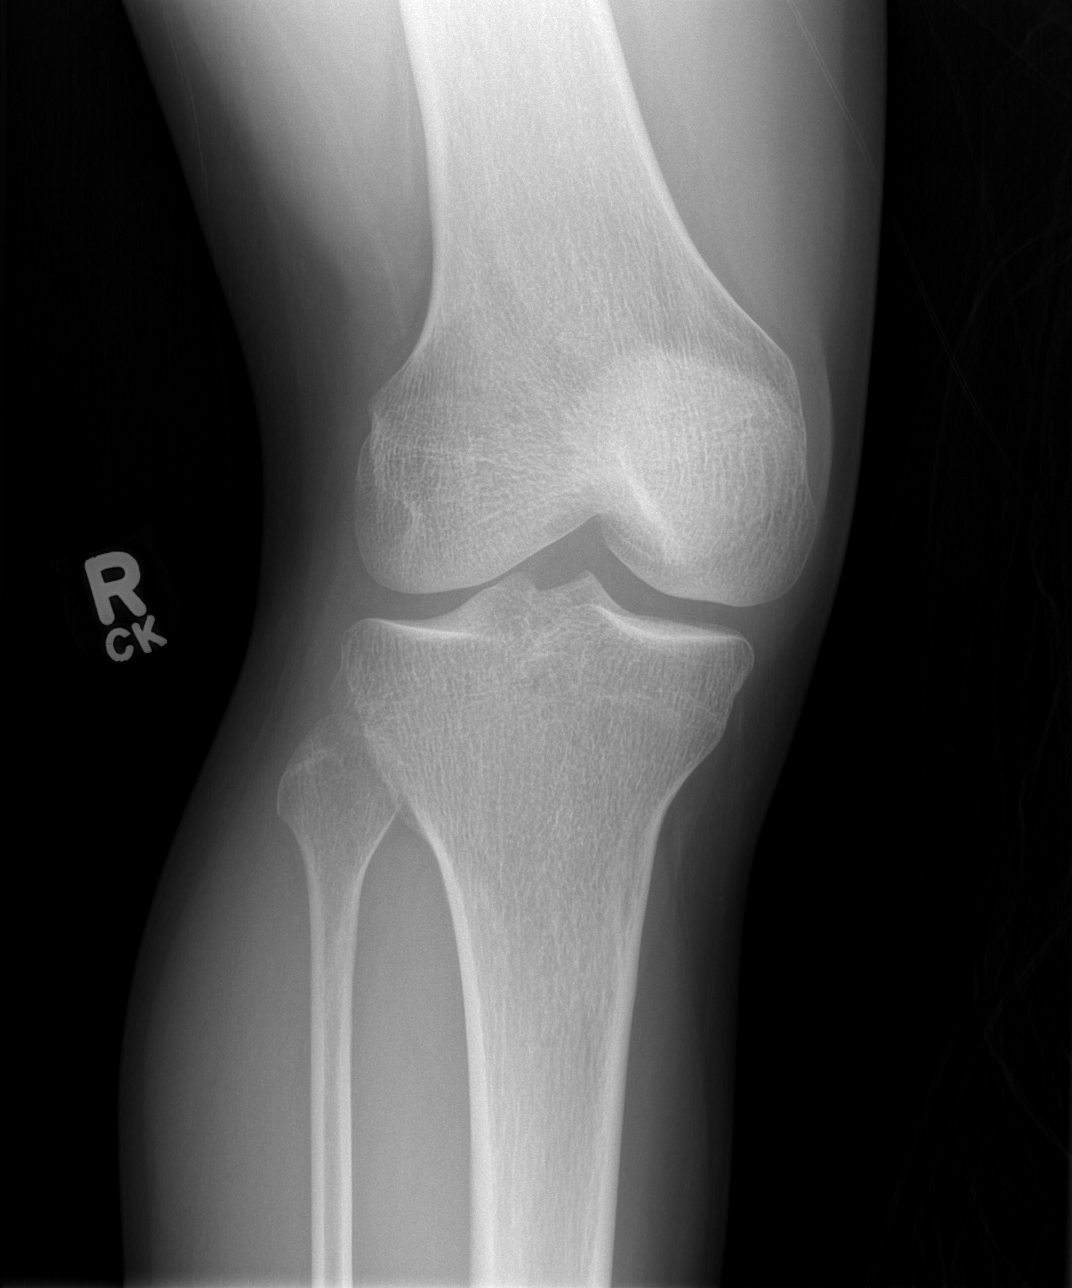

[t knee lat right]
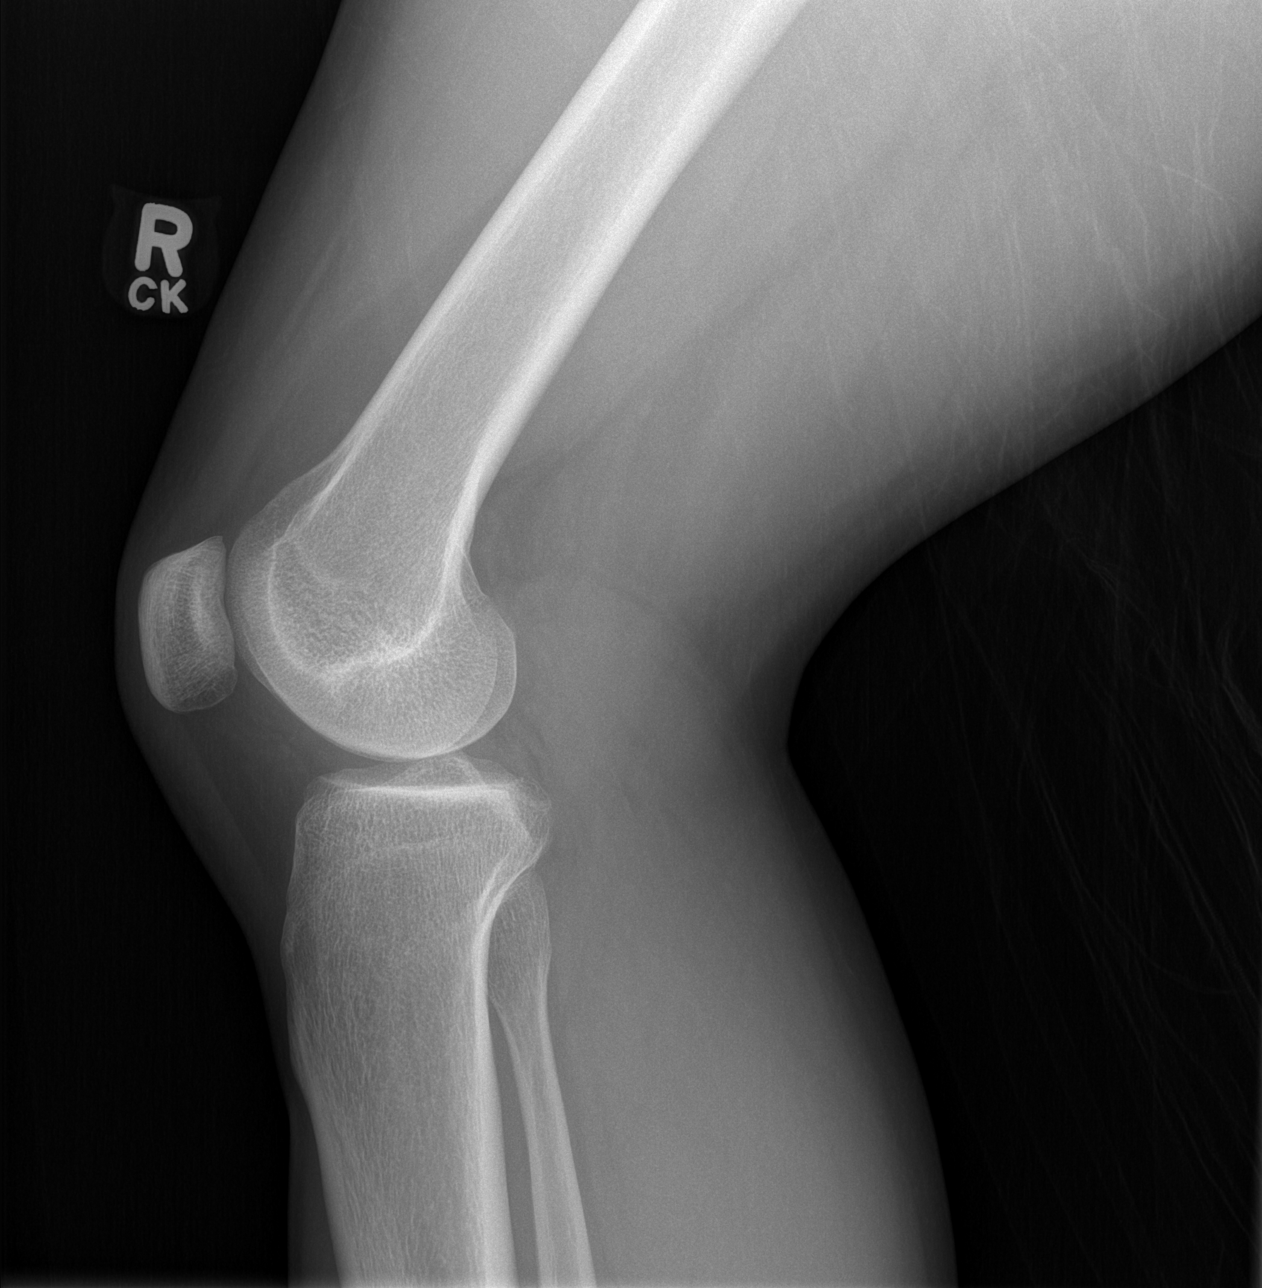

[4 of 4 positions shown; findings below may reference images not displayed]

FINDINGS: No evidence of fracture, dislocation, or joint effusion. No evidence
of arthropathy or other focal bone abnormality. Soft tissues are
unremarkable.
IMPRESSION: Negative.

## 2017-10-20 MED ORDER — ACETAMINOPHEN 500 MG PO TABS: 1000.0000 mg | ORAL_TABLET | Freq: Once | ORAL | Status: DC

## 2017-10-20 NOTE — Discharge Instructions (Addendum)
Take Tylenol every 4 hours as directed for pain . Use crutches as needed so that you do not need to weight-bear on your right knee.  Place an ice pack on painful area 4 times daily for 30 minutes at a time.  Call Dr.Hudnall schedule an office visit or ask your supervisor for referral to a work comp clinic if you continue to have difficulty walking or significant pain in your right knee by Monday October 25, 2017.  Your blood pressure should be rechecked within the next week.

## 2017-10-20 NOTE — ED Triage Notes (Signed)
Patient states that he was at work and he felt his right knee pop  - patient states that this happened about an hour ago

## 2017-10-20 NOTE — ED Provider Notes (Signed)
[facility] EMERGENCY DEPARTMENT Provider Note   CSN: [id number] Arrival date & time: 10/20/17  1052     History   Chief Complaint Chief Complaint  Patient presents with  . Knee Pain    HPI William Cantu is a 32 y.o. male.  Patient was in a kneeling position while at work at 10 AM today.  He feels as if he may have dislocated his patella as he noted that it was out of place.  When he stood up and extended his knee if he thinks his patella popped back into place.  He presently complains of right anteromedial knee pain.  Points to a 1 cm area immediately adjacent to inferomedial aspect of patella.  Pain is worse with weightbearing and improved with nonweightbearing and with his knee slightly flexed.  No other injury.  Prior to coming here  HPI  History reviewed. No pertinent past medical history.  There are no active problems to display for this patient.   History reviewed. No pertinent surgical history.     Home Medications    Prior to Admission medications   Medication Sig Start Date End Date Taking? Authorizing Provider  HYDROcodone-acetaminophen (NORCO/VICODIN) 5-325 MG per tablet Take 1-2 tablets by mouth every 4 (four) hours as needed. 12/01/13   Elpidio AnisUpstill, Shari, PA-C  ibuprofen (ADVIL,MOTRIN) 800 MG tablet Take 1 tablet (800 mg total) by mouth 3 (three) times daily. 12/01/13   Elpidio AnisUpstill, Shari, PA-C  Olopatadine HCl 0.2 % SOLN Apply 1 drop to eye daily. 12/06/13   Leanora Coverogers, Morgan E, MD    Family History History reviewed. No pertinent family history.  Social History Social History   Tobacco Use  . Smoking status: Current Every Day Smoker  . Smokeless tobacco: Never Used  Substance Use Topics  . Alcohol use: No  . Drug use: No     Allergies   Patient has no known allergies.   Review of Systems Review of Systems  Constitutional: Negative.   Musculoskeletal: Positive for arthralgias and gait problem.       Right knee pain     Physical  Exam Updated Vital Signs BP (!) 159/112 (BP Location: Right Arm)   Pulse 68   Temp 98.6 F (37 C) (Oral)   Resp 18   Ht 5\' 7"  (1.702 m)   Wt 83.9 kg (185 lb)   SpO2 100%   BMI 28.98 kg/m   Physical Exam  Constitutional: He appears well-developed. No distress.  HENT:  Head: Normocephalic and atraumatic.  Eyes: EOM are normal.  Neck: Neck supple.  Cardiovascular: Normal rate.  Pulmonary/Chest: Effort normal.  Abdominal: He exhibits no distension.  Musculoskeletal:  Lower extremity no swelling no deformity, minimally tender at right anteromedial knee.  No effusion.  No ecchymosis.  Knee is held in slightly flexed position.  He has pain with extending his knee fully.  DP pulse 2+.  Good capillary refill.  Nursing note and vitals reviewed.    ED Treatments / Results  Labs (all labs ordered are listed, but only abnormal results are displayed) Labs Reviewed - No data to display  EKG  EKG Interpretation None       Radiology Dg Knee Complete 4 Views Right  Result Date: 10/20/2017 CLINICAL DATA:  Right knee pain. EXAM: RIGHT KNEE - COMPLETE 4+ VIEW COMPARISON:  None. FINDINGS: No evidence of fracture, dislocation, or joint effusion. No evidence of arthropathy or other focal bone abnormality. Soft tissues are unremarkable. IMPRESSION: Negative. Electronically Signed  By: Francene Boyers M.D.   On: 10/20/2017 11:25    Procedures Procedures (including critical care time)  Medications Ordered in ED Medications  acetaminophen (TYLENOL) tablet 1,000 mg (not administered)   X-ray viewed by me.  Initial Impression / Assessment and Plan / ED Course  I have reviewed the triage vital signs and the nursing notes.  Pertinent labs & imaging results that were available during my care of the patient were reviewed by me and considered in my medical decision making (see chart for details).     Suspect patellar dislocation which spontaneously reduced.  Plan crutches, ice, Tylenol.   Referral Dr. Pearletha Forge or workers comp clinic.  Blood pressure recheck 1 week.  Referral primary care  Final Clinical Impressions(s) / ED Diagnoses   Final diagnoses:  Sprain of right knee, unspecified ligament, initial encounter  #2 elevated blood pressure  ED Discharge Orders    None       Doug Sou, MD 10/20/17 1207

## 2020-08-24 DIAGNOSIS — G459 Transient cerebral ischemic attack, unspecified: Secondary | ICD-10-CM

## 2020-08-24 HISTORY — DX: Transient cerebral ischemic attack, unspecified: G45.9

## 2020-10-01 ENCOUNTER — Other Ambulatory Visit: Payer: Self-pay

## 2020-10-01 ENCOUNTER — Emergency Department (HOSPITAL_COMMUNITY): Payer: Self-pay

## 2020-10-01 ENCOUNTER — Encounter (HOSPITAL_COMMUNITY): Payer: Self-pay

## 2020-10-01 ENCOUNTER — Inpatient Hospital Stay (HOSPITAL_COMMUNITY)
Admission: EM | Admit: 2020-10-01 | Discharge: 2020-10-04 | DRG: 065 | Disposition: A | Discharge status: Home / Self Care | Payer: Self-pay | Attending: Internal Medicine | Admitting: Internal Medicine

## 2020-10-01 DIAGNOSIS — Z8673 Personal history of transient ischemic attack (TIA), and cerebral infarction without residual deficits: Secondary | ICD-10-CM | POA: Diagnosis present

## 2020-10-01 DIAGNOSIS — Z20822 Contact with and (suspected) exposure to covid-19: Secondary | ICD-10-CM | POA: Diagnosis present

## 2020-10-01 DIAGNOSIS — I639 Cerebral infarction, unspecified: Secondary | ICD-10-CM | POA: Diagnosis present

## 2020-10-01 DIAGNOSIS — R471 Dysarthria and anarthria: Secondary | ICD-10-CM | POA: Diagnosis present

## 2020-10-01 DIAGNOSIS — G459 Transient cerebral ischemic attack, unspecified: Secondary | ICD-10-CM | POA: Diagnosis present

## 2020-10-01 DIAGNOSIS — R451 Restlessness and agitation: Secondary | ICD-10-CM | POA: Diagnosis present

## 2020-10-01 DIAGNOSIS — F1290 Cannabis use, unspecified, uncomplicated: Secondary | ICD-10-CM | POA: Diagnosis present

## 2020-10-01 DIAGNOSIS — N289 Disorder of kidney and ureter, unspecified: Secondary | ICD-10-CM | POA: Diagnosis present

## 2020-10-01 DIAGNOSIS — I10 Essential (primary) hypertension: Secondary | ICD-10-CM | POA: Diagnosis present

## 2020-10-01 DIAGNOSIS — R29702 NIHSS score 2: Secondary | ICD-10-CM | POA: Diagnosis present

## 2020-10-01 DIAGNOSIS — E876 Hypokalemia: Secondary | ICD-10-CM | POA: Diagnosis present

## 2020-10-01 DIAGNOSIS — R29810 Facial weakness: Secondary | ICD-10-CM | POA: Diagnosis present

## 2020-10-01 DIAGNOSIS — R059 Cough, unspecified: Secondary | ICD-10-CM | POA: Diagnosis present

## 2020-10-01 DIAGNOSIS — R000 Tachycardia, unspecified: Secondary | ICD-10-CM | POA: Diagnosis present

## 2020-10-01 DIAGNOSIS — E785 Hyperlipidemia, unspecified: Secondary | ICD-10-CM | POA: Diagnosis present

## 2020-10-01 DIAGNOSIS — I160 Hypertensive urgency: Secondary | ICD-10-CM | POA: Diagnosis present

## 2020-10-01 DIAGNOSIS — I6340 Cerebral infarction due to embolism of unspecified cerebral artery: Principal | ICD-10-CM | POA: Diagnosis present

## 2020-10-01 DIAGNOSIS — F17210 Nicotine dependence, cigarettes, uncomplicated: Secondary | ICD-10-CM | POA: Diagnosis present

## 2020-10-01 DIAGNOSIS — G8194 Hemiplegia, unspecified affecting left nondominant side: Secondary | ICD-10-CM | POA: Diagnosis present

## 2020-10-01 DIAGNOSIS — I161 Hypertensive emergency: Secondary | ICD-10-CM

## 2020-10-01 DIAGNOSIS — Z8249 Family history of ischemic heart disease and other diseases of the circulatory system: Secondary | ICD-10-CM

## 2020-10-01 LAB — URINALYSIS, ROUTINE W REFLEX MICROSCOPIC
Bacteria, UA: NONE SEEN
Bilirubin Urine: NEGATIVE
Glucose, UA: NEGATIVE mg/dL
Ketones, ur: NEGATIVE mg/dL
Leukocytes,Ua: NEGATIVE
Nitrite: NEGATIVE
Protein, ur: NEGATIVE mg/dL
Specific Gravity, Urine: 1.031 — ABNORMAL HIGH (ref 1.005–1.030)
pH: 6 (ref 5.0–8.0)

## 2020-10-01 LAB — COMPREHENSIVE METABOLIC PANEL
ALT: 15 U/L (ref 0–44)
AST: 20 U/L (ref 15–41)
Albumin: 4.3 g/dL (ref 3.5–5.0)
Alkaline Phosphatase: 77 U/L (ref 38–126)
Anion gap: 10 (ref 5–15)
BUN: 11 mg/dL (ref 6–20)
CO2: 26 mmol/L (ref 22–32)
Calcium: 9.4 mg/dL (ref 8.9–10.3)
Chloride: 106 mmol/L (ref 98–111)
Creatinine, Ser: 1.22 mg/dL (ref 0.61–1.24)
GFR, Estimated: >60 mL/min (ref >60)
Glucose, Bld: 81 mg/dL (ref 70–99)
Potassium: 3.4 mmol/L — ABNORMAL LOW (ref 3.5–5.1)
Sodium: 142 mmol/L (ref 135–145)
Total Bilirubin: 1 mg/dL (ref 0.3–1.2)
Total Protein: 7.7 g/dL (ref 6.5–8.1)

## 2020-10-01 LAB — I-STAT CHEM 8, ED
BUN: 11 mg/dL (ref 6–20)
Calcium, Ion: 1.23 mmol/L (ref 1.15–1.40)
Chloride: 104 mmol/L (ref 98–111)
Creatinine, Ser: 1.2 mg/dL (ref 0.61–1.24)
Glucose, Bld: 78 mg/dL (ref 70–99)
HCT: 48 % (ref 39.0–52.0)
Hemoglobin: 16.3 g/dL (ref 13.0–17.0)
Potassium: 3.4 mmol/L — ABNORMAL LOW (ref 3.5–5.1)
Sodium: 142 mmol/L (ref 135–145)
TCO2: 25 mmol/L (ref 22–32)

## 2020-10-01 LAB — RAPID URINE DRUG SCREEN, HOSP PERFORMED
Amphetamines: NOT DETECTED
Barbiturates: NOT DETECTED
Benzodiazepines: NOT DETECTED
Cocaine: NOT DETECTED
Opiates: NOT DETECTED
Tetrahydrocannabinol: POSITIVE — AB

## 2020-10-01 LAB — CBC
HCT: 44.8 % (ref 39.0–52.0)
Hemoglobin: 15 g/dL (ref 13.0–17.0)
MCH: 28.1 pg (ref 26.0–34.0)
MCHC: 33.5 g/dL (ref 30.0–36.0)
MCV: 83.9 fL (ref 80.0–100.0)
Platelets: 203 10*3/uL (ref 150–400)
RBC: 5.34 MIL/uL (ref 4.22–5.81)
RDW: 13.8 % (ref 11.5–15.5)
WBC: 9.6 10*3/uL (ref 4.0–10.5)
nRBC: 0 % (ref 0.0–0.2)

## 2020-10-01 LAB — DIFFERENTIAL
Abs Immature Granulocytes: 0.02 10*3/uL (ref 0.00–0.07)
Basophils Absolute: 0 10*3/uL (ref 0.0–0.1)
Basophils Relative: 0 %
Eosinophils Absolute: 0.7 10*3/uL — ABNORMAL HIGH (ref 0.0–0.5)
Eosinophils Relative: 7 %
Immature Granulocytes: 0 %
Lymphocytes Relative: 36 %
Lymphs Abs: 3.4 10*3/uL (ref 0.7–4.0)
Monocytes Absolute: 0.8 10*3/uL (ref 0.1–1.0)
Monocytes Relative: 8 %
Neutro Abs: 4.6 10*3/uL (ref 1.7–7.7)
Neutrophils Relative %: 49 %

## 2020-10-01 LAB — APTT: aPTT: 30 seconds (ref 24–36)

## 2020-10-01 LAB — ETHANOL: Alcohol, Ethyl (B): <10 mg/dL (ref <10)

## 2020-10-01 LAB — PROTIME-INR
INR: 1.1 (ref 0.8–1.2)
Prothrombin Time: 13.6 seconds (ref 11.4–15.2)

## 2020-10-01 LAB — RESP PANEL BY RT-PCR (FLU A&B, COVID) ARPGX2
Influenza A by PCR: NEGATIVE
Influenza B by PCR: NEGATIVE
SARS Coronavirus 2 by RT PCR: NEGATIVE

## 2020-10-01 LAB — TROPONIN I (HIGH SENSITIVITY): Troponin I (High Sensitivity): 11 ng/L (ref <18)

## 2020-10-01 IMAGING — CT CT ANGIO NECK
2 of 10 series · 9 of 33 positions shown · IV contrast (omnipaque)
Comparison: None.

CLINICAL DATA: Initial evaluation for acute slurred speech.
Possible stroke.

EXAM:
CT ANGIOGRAPHY HEAD AND NECK
TECHNIQUE: Multidetector CT imaging of the head and neck was performed using
the standard protocol during bolus administration of intravenous
contrast. Multiplanar CT image reconstructions and MIPs were
obtained to evaluate the vascular anatomy. Carotid stenosis
measurements (when applicable) are obtained utilizing NASCET
criteria, using the distal internal carotid diameter as the
denominator.
CONTRAST:  75mL OMNIPAQUE IOHEXOL 350 MG/ML SOLN

[Series 9: cta head neck · axial · 0.39mm/px · z∈[-314,-14]mm · 3 of 151 slices shown]
[im 1/151  soft-tissue]
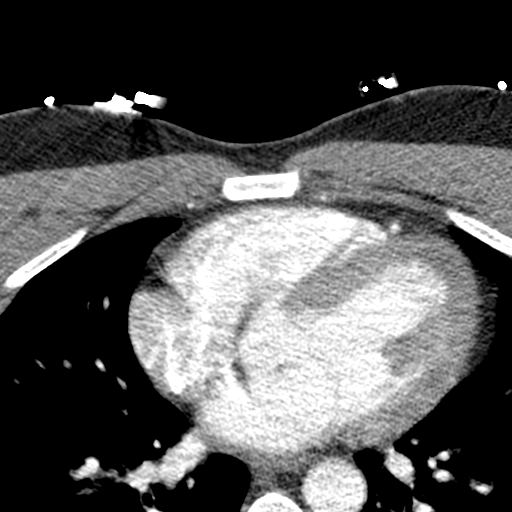
[im 76/151  bone]
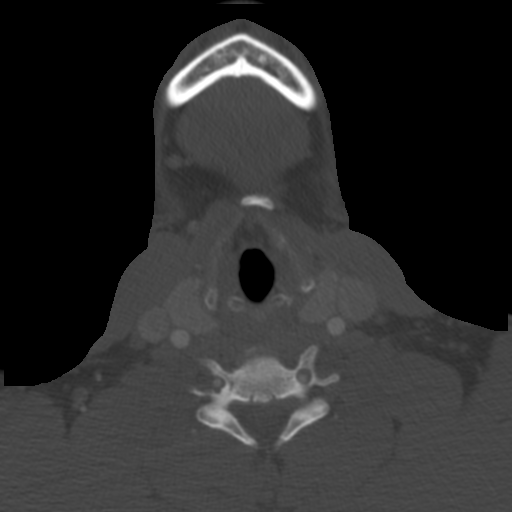
[im 151/151  soft-tissue]
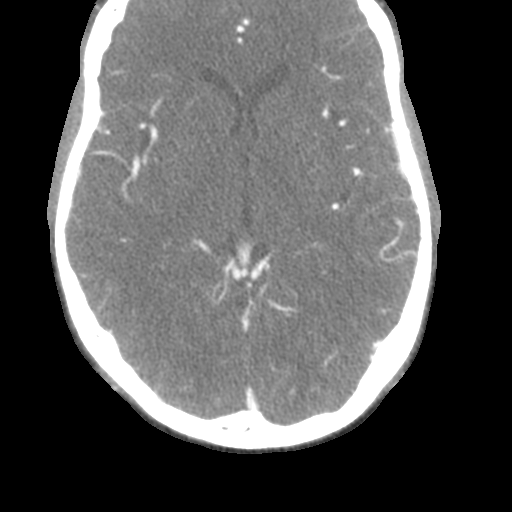

[Series 11: ax thin · axial · 0.47mm/px · z∈[-270,-58]mm · 6 of 299 slices shown]
[im 43/299  soft-tissue]
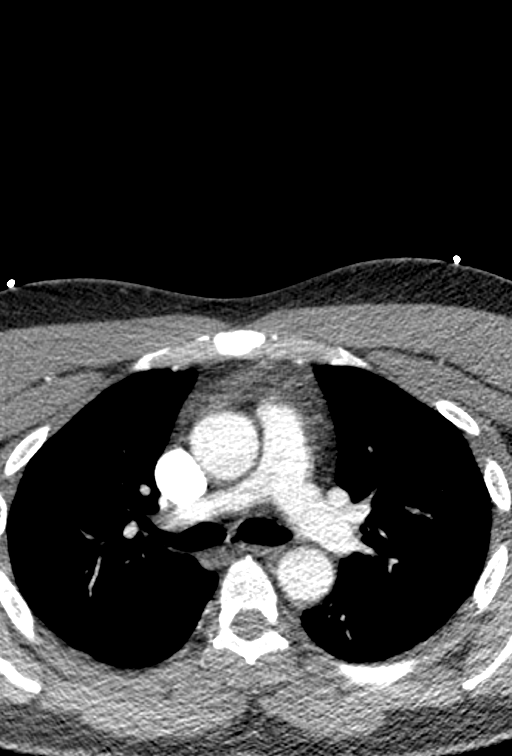
[im 86/299  soft-tissue]
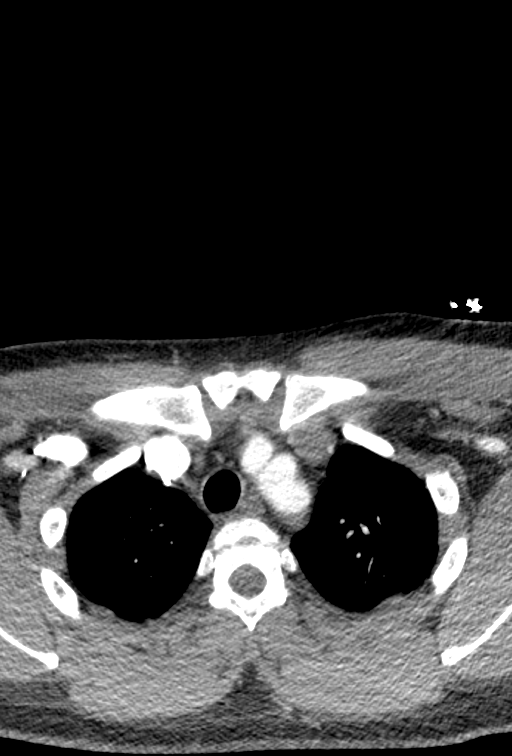
[im 128/299  soft-tissue]
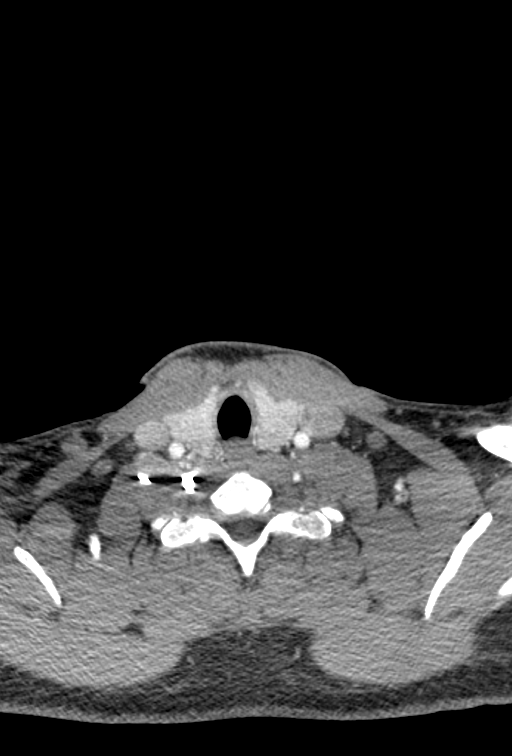
[im 171/299  soft-tissue]
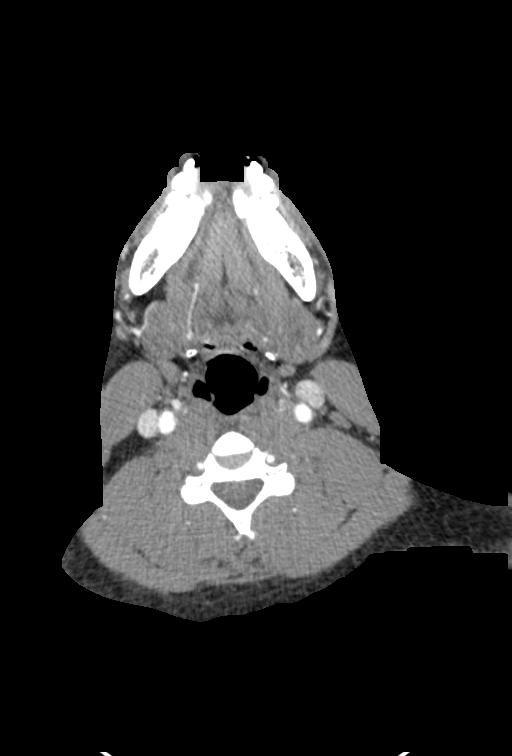
[im 213/299  soft-tissue]
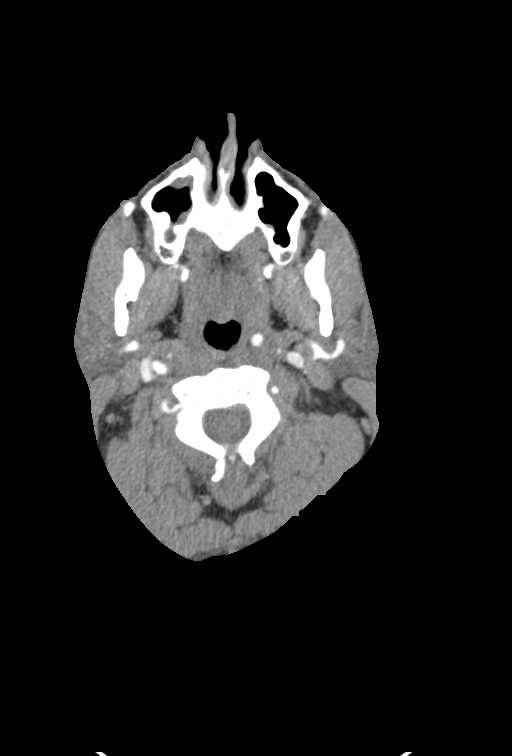
[im 256/299  soft-tissue]
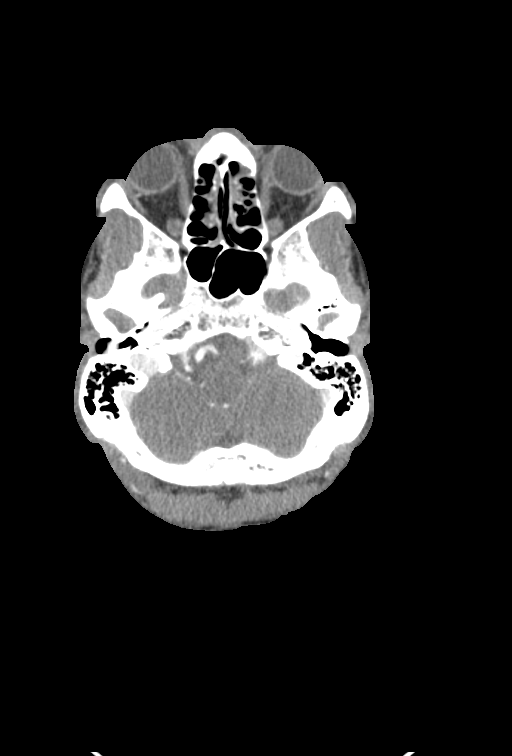

[9 of 33 positions shown; findings below may reference images not displayed]

FINDINGS: CT HEAD FINDINGS

Brain: Cerebral volume within normal limits for patient age. Chronic
appearing lacunar infarct present at the anterior limb of the right
internal capsule. Few scattered patchy hypodensities noted involving
the supratentorial cerebral white matter, nonspecific, but favored
to reflect age advanced chronic microvascular ischemic disease.

No evidence for acute intracranial hemorrhage. No findings to
suggest acute large vessel territory infarct. No mass lesion,
midline shift, or mass effect. Ventricles are normal in size without
evidence for hydrocephalus. No extra-axial fluid collection
identified.

Vascular: No hyperdense vessel identified.

Skull: Scalp soft tissues demonstrate no acute abnormality.
Calvarium intact.

Sinuses/Orbits: Globes and orbital soft tissues within normal
limits.

Scattered mucosal thickening noted within the ethmoidal air cells.
Visualized paranasal sinuses are otherwise largely clear. No mastoid
effusion.

CTA NECK FINDINGS

Aortic arch: Visualized aortic arch of normal caliber with normal
branch pattern. No hemodynamically significant stenosis about the
origin of the great vessels.

Right carotid system: Right common and internal carotid arteries
widely patent without stenosis, dissection or occlusion.

Left carotid system: Left common and internal carotid arteries
widely patent without stenosis, dissection or occlusion. Proximal
left ICA partially medialized into the retropharyngeal space.

Vertebral arteries: Both vertebral arteries arise from the
subclavian arteries. No proximal subclavian artery stenosis. Left
vertebral artery slightly dominant. Proximal right vertebral artery
not well evaluated due to adjacent venous contamination. Visualized
portions of the vertebral arteries are widely patent without
stenosis dissection or occlusion.

Skeleton: No visible acute osseous finding. No discrete or worrisome
osseous lesions.

Other neck: No other acute soft tissue abnormality within the neck.
No mass or adenopathy.

Upper chest: Visualized upper chest demonstrates no acute finding.

Review of the MIP images confirms the above findings

CTA HEAD FINDINGS

Anterior circulation: Evaluation of the intracranial circulation
technically limited due to a technical error with the scanner during
the time of scanning. The upper and mid aspects of the brain are
clipped on the CTA portion of this study, with incomplete
visualization of the distal arterial vasculature.

Both internal carotid arteries are widely patent to the termini
without stenosis or other abnormality. A1 segments patent
bilaterally. Normal anterior communicating artery complex.
Visualized ACAs widely patent. No M1 stenosis or occlusion. Normal
MCA bifurcations. Visualized distal MCA branches well perfused and
widely patent bilaterally.

Posterior circulation: Both V4 segments widely patent to the
vertebrobasilar junction. Both PICA origins patent and normal.
Basilar widely patent to its distal aspect without stenosis.
Superior cerebellar arteries patent bilaterally. Left PCA supplied
via the basilar as well as a robust left posterior communicating
artery. Fetal type origin of the right PCA. Visualized PCAs well
perfused without stenosis or other abnormality.

Venous sinuses: Visualized major dural sinuses are patent.

Anatomic variants: Fetal type origin of the right PCA. No visible
aneurysm.

Review of the MIP images confirms the above findings
IMPRESSION: CT HEAD IMPRESSION:

1. No acute intracranial abnormality.
2. Chronic lacunar infarct at the anterior limb of the right
internal capsule.
3. Patchy hypodensity involving the supratentorial cerebral white
matter, nonspecific, but favored to reflect age advanced chronic
microvascular ischemic disease. Sequelae of demyelinating disease
would be the primary differential consideration. Finding could be
further assessed with dedicated MRI of the brain as clinically
warranted.

CTA HEAD AND NECK IMPRESSION:

1. Technically limited exam due to a technical error with the
scanner during the time of scanning. The upper and mid aspects of
the brain are clipped on the CTA portion of this study, with
incomplete visualization of the distal arterial vasculature.
2. Negative CTA of the head and neck allowing for technical
limitations as above. No large vessel occlusion, hemodynamically
significant stenosis, or other acute vascular abnormality.

## 2020-10-01 MED ORDER — IOHEXOL 350 MG/ML SOLN
75.0000 mL | Freq: Once | INTRAVENOUS | Status: AC | PRN
  Administered 2020-10-01: 75 mL via INTRAVENOUS

## 2020-10-01 NOTE — ED Notes (Signed)
Pt. I-stat Chem 8 results potassium 3.4. EDP,[place],MD made aware.

## 2020-10-01 NOTE — ED Provider Notes (Signed)
[facility]-EMERGENCY DEPT Provider Note   CSN: [id number] Arrival date & time: 10/01/20  2028     History Chief Complaint  Patient presents with  . Facial Droop    William Cantu is a 35 y.o. male with no prior past medical history that presents to the emergency department today for left-sided facial droop, slurred speech and left arm weakness.  Patient states that he took a nap and woke up about 30 minutes ago, states that he was acting normal, left arm was normal and then 10 minutes later he started having left arm weakness, was not able to open the fridge with his left arm.  Patient also states that his dad states that he was speaking " funny", states that his speech was slurred and his face was drooping on one side.  Nursing states that when he checked and he did have significant left facial droop.  When I assessed patient ten seconds later, facial droop, slurred speech and left weakness had resolved.  Patient states that he is never had anything happen to him before like this.  Denies any recent fevers, chills, nausea, vomiting.  Denies any current symptoms.  Denies any headache, head trauma, prior CVA.  Patient states that he does have hypertension, does not take anything for this.  Patient works as a Naval architect.  Denies any IV drug use, cocaine use, alcohol use.  States that all of his symptoms had resolved.  Started around 8:20 PM, lasted about 30 minutes resolving around 8:50 PM.  HPI     History reviewed. No pertinent past medical history.  There are no problems to display for this patient.   History reviewed. No pertinent surgical history.     No family history on file.  Social History   Tobacco Use  . Smoking status: Current Every Day Smoker  . Smokeless tobacco: Never Used  Substance Use Topics  . Alcohol use: No  . Drug use: No    Home Medications Prior to Admission medications   Medication Sig Start Date End Date Taking? Authorizing  Provider  HYDROcodone-acetaminophen (NORCO/VICODIN) 5-325 MG per tablet Take 1-2 tablets by mouth every 4 (four) hours as needed. 12/01/13   Elpidio Anis, PA-C  ibuprofen (ADVIL,MOTRIN) 800 MG tablet Take 1 tablet (800 mg total) by mouth 3 (three) times daily. 12/01/13   Elpidio Anis, PA-C  Olopatadine HCl 0.2 % SOLN Apply 1 drop to eye daily. 12/06/13   Leanora Cover, MD    Allergies    Patient has no known allergies.  Review of Systems   Review of Systems  Constitutional: Negative for chills, diaphoresis, fatigue and fever.  HENT: Negative for congestion, sore throat and trouble swallowing.   Eyes: Negative for pain and visual disturbance.  Respiratory: Negative for cough, shortness of breath and wheezing.   Cardiovascular: Negative for chest pain, palpitations and leg swelling.  Gastrointestinal: Negative for abdominal distention, abdominal pain, diarrhea, nausea and vomiting.  Genitourinary: Negative for difficulty urinating.  Musculoskeletal: Negative for back pain, neck pain and neck stiffness.  Skin: Negative for pallor.  Neurological: Positive for facial asymmetry and weakness. Negative for dizziness, speech difficulty and headaches.  Psychiatric/Behavioral: Negative for confusion.    Physical Exam Updated Vital Signs BP (!) 189/134   Pulse 73   Temp 98.5 F (36.9 C) (Oral)   Resp 17   SpO2 99%   Physical Exam Constitutional:      General: He is not in acute distress.  Appearance: Normal appearance. He is not ill-appearing, toxic-appearing or diaphoretic.  HENT:     Head: Normocephalic and atraumatic.     Mouth/Throat:     Mouth: Mucous membranes are moist.     Pharynx: Oropharynx is clear.  Eyes:     General: No scleral icterus.    Extraocular Movements: Extraocular movements intact.     Pupils: Pupils are equal, round, and reactive to light.  Cardiovascular:     Rate and Rhythm: Normal rate and regular rhythm.     Pulses: Normal pulses.     Heart  sounds: Normal heart sounds.  Pulmonary:     Effort: Pulmonary effort is normal. No respiratory distress.     Breath sounds: Normal breath sounds. No stridor. No wheezing, rhonchi or rales.  Chest:     Chest wall: No tenderness.  Abdominal:     General: Abdomen is flat. There is no distension.     Palpations: Abdomen is soft.     Tenderness: There is no abdominal tenderness. There is no guarding or rebound.  Musculoskeletal:        General: No swelling or tenderness. Normal range of motion.     Cervical back: Normal range of motion and neck supple. No rigidity.     Right lower leg: No edema.     Left lower leg: No edema.  Skin:    General: Skin is warm and dry.     Capillary Refill: Capillary refill takes less than 2 seconds.     Coloration: Skin is not pale.  Neurological:     General: No focal deficit present.     Mental Status: He is alert and oriented to person, place, and time.     Comments: Alert. Clear speech. No facial droop.  Eyebrow symmetric.  CNIII-XII grossly intact. Bilateral upper and lower extremities' sensation grossly intact. 5/5 symmetric strength with grip strength and with plantar and dorsi flexion bilaterally. Negative pronator drift. Negative Romberg sign. Gait is steady and intact    Psychiatric:        Mood and Affect: Mood normal.        Behavior: Behavior normal.     ED Results / Procedures / Treatments   Labs (all labs ordered are listed, but only abnormal results are displayed) Labs Reviewed  DIFFERENTIAL - Abnormal; Notable for the following components:      Result Value   Eosinophils Absolute 0.7 (*)    All other components within normal limits  I-STAT CHEM 8, ED - Abnormal; Notable for the following components:   Potassium 3.4 (*)    All other components within normal limits  RESP PANEL BY RT-PCR (FLU A&B, COVID) ARPGX2  PROTIME-INR  APTT  CBC  ETHANOL  COMPREHENSIVE METABOLIC PANEL  RAPID URINE DRUG SCREEN, HOSP PERFORMED  URINALYSIS,  ROUTINE W REFLEX MICROSCOPIC  TROPONIN I (HIGH SENSITIVITY)    EKG EKG Interpretation  Date/Time:  Tuesday October 01 2020 21:34:41 EST Ventricular Rate:  70 PR Interval:    QRS Duration: 84 QT Interval:  382 QTC Calculation: 413 R Axis:   51 Text Interpretation: Sinus rhythm Nonspecific T abnormalities, inferior leads Confirmed by Virgina Norfolk [phone]) on 10/01/2020 9:54:12 PM   Radiology No results found.  Procedures Procedures   Medications Ordered in ED Medications  iohexol (OMNIPAQUE) 350 MG/ML injection 75 mL (75 mLs Intravenous Contrast Given 10/01/20 2141)    ED Course  I have reviewed the triage vital signs and the nursing notes.  Pertinent labs &  imaging results that were available during my care of the patient were reviewed by me and considered in my medical decision making (see chart for details).    MDM Rules/Calculators/A&P                          Mahari Cianci is a 35 y.o. male with no prior past medical history that presents to the emergency department today for left-sided facial droop, slurred speech and left arm weakness.  Patient had symptom resolve meant, normal neuro exam currently.  Most likely TIA, will obtain basic work-up and reevaluate.  Patient is significantly hypertensive in the emergency department, 195/152 currently. Repeat 173/134  No drug use, no CVA history.  Will consult neuro after scans have resolved.  Pt care was handed off to Dr. Lockie Mola. Complete history and physical and current plan have been communicated.  Please refer to their note for the remainder of ED care and ultimate disposition. Awaiting scans.   I discussed this case with my attending physician who cosigned this note including patient's presenting symptoms, physical exam, and planned diagnostics and interventions. Attending physician stated agreement with plan or made changes to plan which were implemented.   Attending physician assessed patient at bedside.   Final  Clinical Impression(s) / ED Diagnoses Final diagnoses:  TIA (transient ischemic attack)    Rx / DC Orders ED Discharge Orders    None       Farrel Gordon, PA-C 10/01/20 2204    Virgina Norfolk, DO 10/01/20 2312

## 2020-10-01 NOTE — ED Notes (Signed)
Pt aware urine sample is needed 

## 2020-10-01 NOTE — ED Triage Notes (Signed)
Patient arrived stating that he woke up 30 minutes ago and had slurred speech. States the left side of his face felt numb. Some speech difficulty in triage and asymmetrical smile.

## 2020-10-01 NOTE — ED Provider Notes (Signed)
I personally evaluated the patient during the encounter and completed a history, physical, procedures, medical decision making to contribute to the overall care of the patient and decision making for the patient briefly, the patient is a 35 y.o. male is a 35 year old male with no significant medical history presents the ED with strokelike symptoms. Blood pressure upon arrival 195/152 and repeat 160/130. Patient states that about 30 minutes ago he experienced left-sided facial weakness, left upper and left lower extremity weakness. He thought the left side of his face felt numb. He was having difficulty with speech. He had gotten home from work and took a nap and woke up went downstairs and then the symptoms developed. He denies any alcohol or drug use. No headache, no fever. Upon my evaluation symptoms have completely resolved. Triage nurse did notice left-sided facial weakness and left arm weakness. His neurological exam is overall normal at this time. He is overall asymptomatic. Not having any chest pain or shortness of breath. Given his elevated blood pressure concern for possible TIA/hypertensive emergency equivalent. Will get CT scan of head and lab work. Will touch base with neurology. Anticipate admission given TIA symptoms and high blood pressure.  CT imaging of the head overall unremarkable. No large vessel occlusion. Dr. Jerrell Belfast with neurology also recommends admission for MRI and TIA work-up. Mostly will need blood pressure control and medication optimization. Patient does have a chronic lacunar infarct in the right internal capsule. Does also have some patchy hypodensity as well which likely favors some chronic microvascular ischemic disease but demyelinating disease could also be possible. Will get MRI with and without contrast and have patient admitted to further evaluate.  This chart was dictated using voice recognition software.  Despite best efforts to proofread,  errors can occur which can  change the documentation meaning.      EKG Interpretation  Date/Time:  Tuesday October 01 2020 21:34:41 EST Ventricular Rate:  70 PR Interval:    QRS Duration: 84 QT Interval:  382 QTC Calculation: 413 R Axis:   51 Text Interpretation: Sinus rhythm Nonspecific T abnormalities, inferior leads Confirmed by Virgina Norfolk [phone]) on 10/01/2020 9:54:12 PM      CRITICAL CARE Performed by: Virgina Norfolk   Total critical care time: 35 minutes  Critical care time was exclusive of separately billable procedures and treating other patients.  Critical care was necessary to treat or prevent imminent or life-threatening deterioration.  Critical care was time spent personally by me on the following activities: development of treatment plan with patient and/or surrogate as well as nursing, discussions with consultants, evaluation of patient's response to treatment, examination of patient, obtaining history from patient or surrogate, ordering and performing treatments and interventions, ordering and review of laboratory studies, ordering and review of radiographic studies, pulse oximetry and re-evaluation of patient's condition.      Virgina Norfolk, DO 10/01/20 2250

## 2020-10-01 NOTE — ED Notes (Signed)
Dr. Lockie Mola aware patients BP 180/116.

## 2020-10-02 ENCOUNTER — Encounter (HOSPITAL_COMMUNITY): Payer: Self-pay | Admitting: Internal Medicine

## 2020-10-02 ENCOUNTER — Observation Stay (HOSPITAL_COMMUNITY): Payer: Self-pay

## 2020-10-02 DIAGNOSIS — G459 Transient cerebral ischemic attack, unspecified: Secondary | ICD-10-CM

## 2020-10-02 DIAGNOSIS — Z8673 Personal history of transient ischemic attack (TIA), and cerebral infarction without residual deficits: Secondary | ICD-10-CM | POA: Diagnosis present

## 2020-10-02 DIAGNOSIS — I639 Cerebral infarction, unspecified: Secondary | ICD-10-CM | POA: Diagnosis present

## 2020-10-02 DIAGNOSIS — I10 Essential (primary) hypertension: Secondary | ICD-10-CM | POA: Diagnosis present

## 2020-10-02 LAB — BASIC METABOLIC PANEL
Anion gap: 9 (ref 5–15)
BUN: 9 mg/dL (ref 6–20)
CO2: 24 mmol/L (ref 22–32)
Calcium: 8.8 mg/dL — ABNORMAL LOW (ref 8.9–10.3)
Chloride: 105 mmol/L (ref 98–111)
Creatinine, Ser: 1.11 mg/dL (ref 0.61–1.24)
GFR, Estimated: >60 mL/min (ref >60)
Glucose, Bld: 119 mg/dL — ABNORMAL HIGH (ref 70–99)
Potassium: 3.2 mmol/L — ABNORMAL LOW (ref 3.5–5.1)
Sodium: 138 mmol/L (ref 135–145)

## 2020-10-02 LAB — LIPID PANEL
Cholesterol: 159 mg/dL (ref 0–200)
HDL: 35 mg/dL — ABNORMAL LOW (ref >40)
LDL Cholesterol: 104 mg/dL — ABNORMAL HIGH (ref 0–99)
Total CHOL/HDL Ratio: 4.5 RATIO
Triglycerides: 98 mg/dL (ref <150)
VLDL: 20 mg/dL (ref 0–40)

## 2020-10-02 LAB — ECHOCARDIOGRAM COMPLETE
Area-P 1/2: 3.42 cm2
Calc EF: 54.6 %
S' Lateral: 3.5 cm
Single Plane A2C EF: 57.4 %
Single Plane A4C EF: 51.9 %

## 2020-10-02 LAB — HEMOGLOBIN A1C
Hgb A1c MFr Bld: 5.6 % (ref 4.8–5.6)
Mean Plasma Glucose: 114.02 mg/dL

## 2020-10-02 LAB — HIV ANTIBODY (ROUTINE TESTING W REFLEX): HIV Screen 4th Generation wRfx: NONREACTIVE

## 2020-10-02 LAB — ANTITHROMBIN III: AntiThromb III Func: 102 % (ref 75–120)

## 2020-10-02 LAB — TSH: TSH: 1.535 u[IU]/mL (ref 0.350–4.500)

## 2020-10-02 IMAGING — MR MR HEAD WO/W CM
21 of 25 series · 35 of 48 positions shown · IV contrast (gadavist)
Comparison: Head CT [DATE] and head and neck CTA [DATE]

CLINICAL DATA: Left-sided weakness, facial droop, and slurred
speech. Accelerated hypertension.

EXAM:
MRI HEAD WITHOUT AND WITH CONTRAST
MRA HEAD WITHOUT CONTRAST
MRA NECK WITHOUT AND WITH CONTRAST
TECHNIQUE: Multiplanar, multiecho pulse sequences of the brain and surrounding
structures were obtained without and with intravenous contrast.
Angiographic images of the Circle of Willis were obtained using MRA
technique without intravenous contrast. Angiographic images of the
neck were obtained using MRA technique without and with intravenous
contrast. Carotid stenosis measurements (when applicable) are
obtained utilizing NASCET criteria, using the distal internal
carotid diameter as the denominator.
CONTRAST:  8mL GADAVIST GADOBUTROL 1 MMOL/ML IV SOLN

[Series 5: DWI · axial · 3.0mm · 1.36mm/px · z∈[-63,+89]mm · 2 of 104 slices shown (1 of 4)]
[im 1/104]
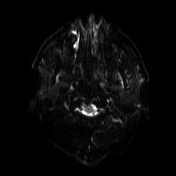
[im 104/104]
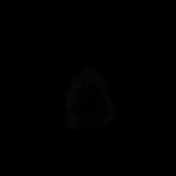

[Series 6: DWI · axial · 3.0mm · 1.36mm/px · 1 of 52 slices shown (2 of 4)]
[im 1/52]
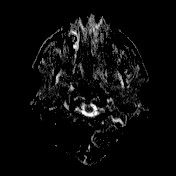

[Series 7: T1 · sagittal · 5.0mm · 0.75mm/px · 1 of 24 slices shown (1 of 2)]
[im 1/24]
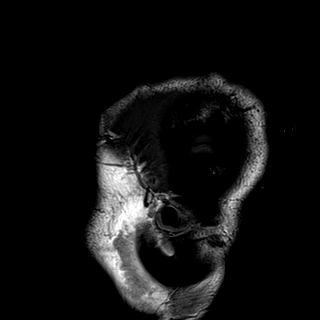

[Series 8: T2 · axial · 5.0mm · 0.62mm/px · 1 of 24 slices shown (1 of 2)]
[im 1/24]
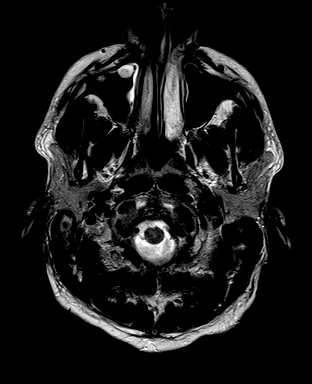

[Series 9: mip_images(sw) · axial · 24.0mm · 0.75mm/px · 1 of 45 slices shown]
[im 1/45]
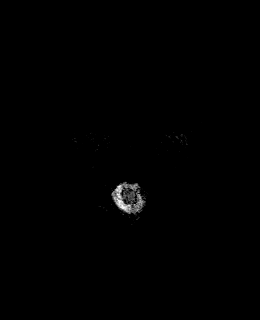

[Series 10: swi_images · axial · 3.0mm · 0.75mm/px · 1 of 52 slices shown]
[im 1/52]
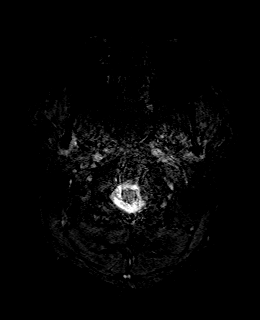

[Series 11: FLAIR · axial · 3.0mm · 0.75mm/px · 1 of 52 slices shown (1 of 2)]
[im 1/52]
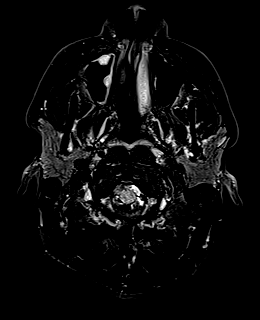

[Series 12: T1 · axial · 3.0mm · 0.47mm/px · 1 of 50 slices shown (2 of 2)]
[im 1/50]
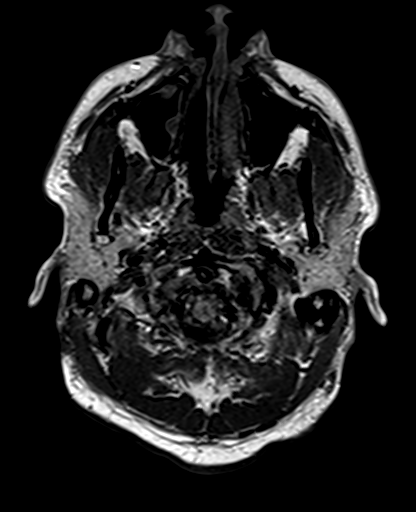

[Series 13: FLAIR · sagittal · 1.1mm · 0.50mm/px · 2 of 112 slices shown (2 of 2)]
[im 1/112]
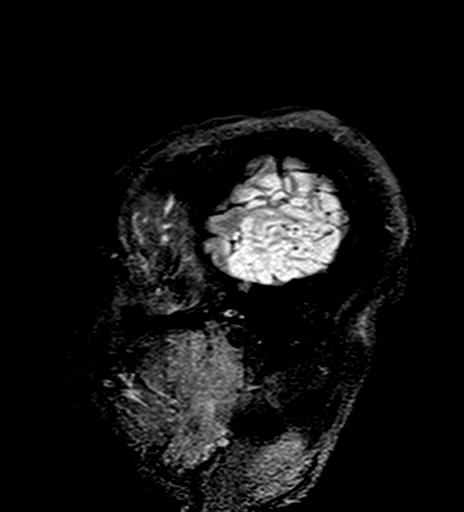
[im 112/112]
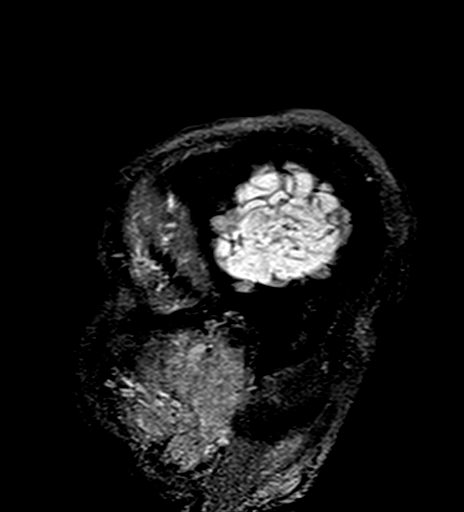

[Series 14: DWI · coronal · 5.0mm · 1.31mm/px · 2 of 68 slices shown (3 of 4)]
[im 1/68]
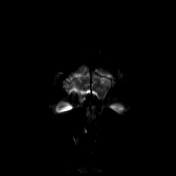
[im 68/68]
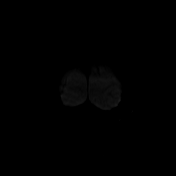

[Series 15: DWI · coronal · 5.0mm · 1.31mm/px · 1 of 34 slices shown (4 of 4)]
[im 1/34]
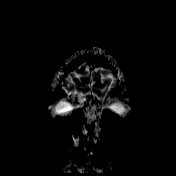

[Series 16: T2 · coronal · 5.0mm · 0.69mm/px · 1 of 26 slices shown (2 of 2)]
[im 1/26]
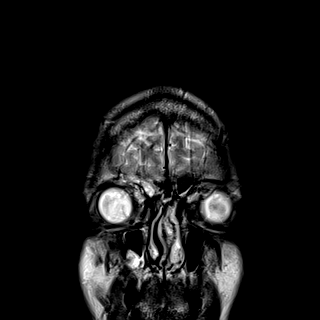

[Series 31: tof_2d_tra · axial · 3.5mm · 0.43mm/px · z∈[-202,-58]mm · 2 of 60 slices shown]
[im 1/60]
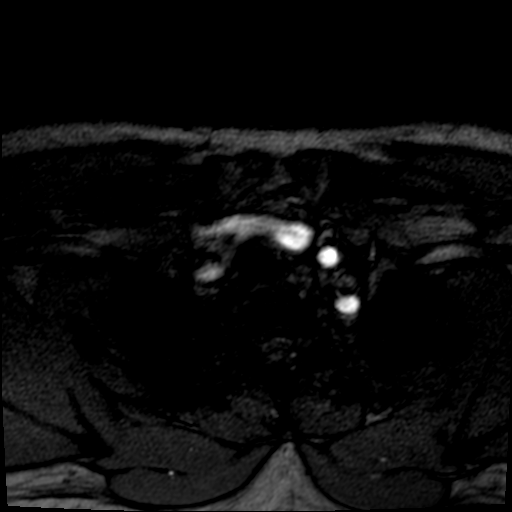
[im 60/60]
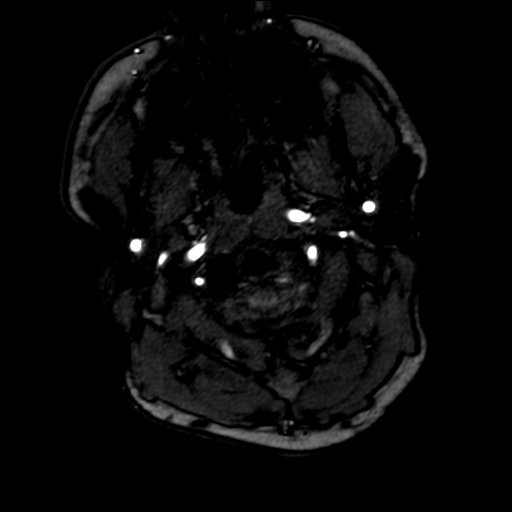

[Series 34: tof_2d_tra_mip_tra · axial · 148.1mm · 0.43mm/px · 1 of 1 slices shown]
[im 1/1]
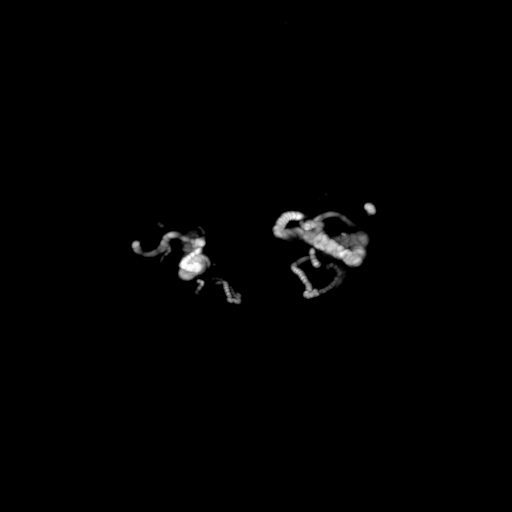

[Series 35: (id)_cor_pre · coronal · 0.8mm · 0.78mm/px · 3 of 96 slices shown]
[im 1/96]
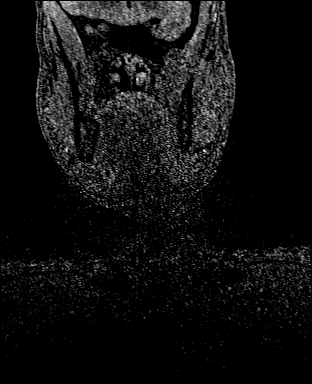
[im 48/96]
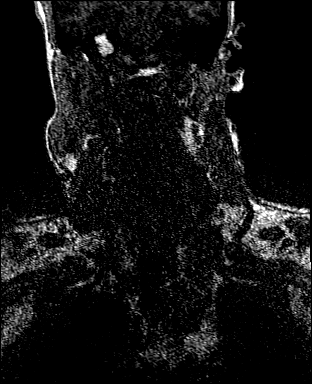
[im 96/96]
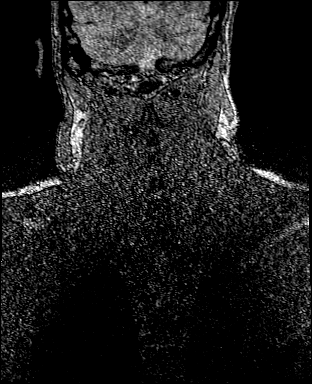

[Series 37: (id)_cor_post · coronal · 0.8mm · 0.78mm/px · 3 of 89 slices shown]
[im 1/89]
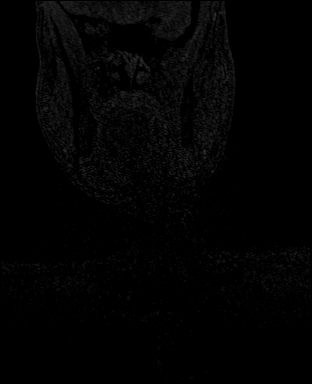
[im 45/89]
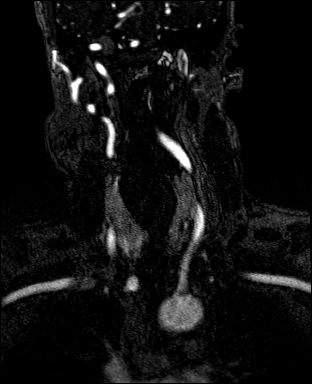
[im 89/89]
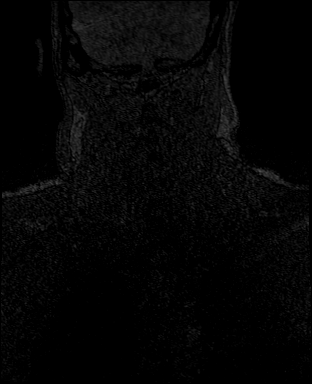

[Series 38: (id)_cor_post_venous · coronal · 0.8mm · 0.78mm/px · 3 of 96 slices shown]
[im 1/96]
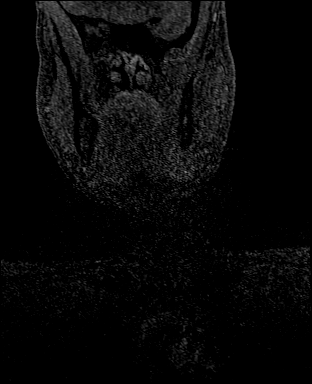
[im 48/96]
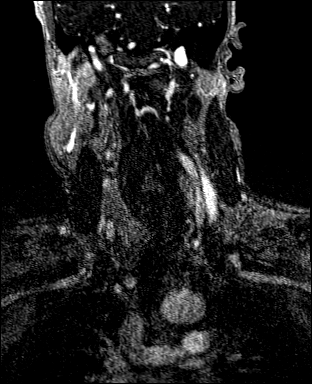
[im 96/96]
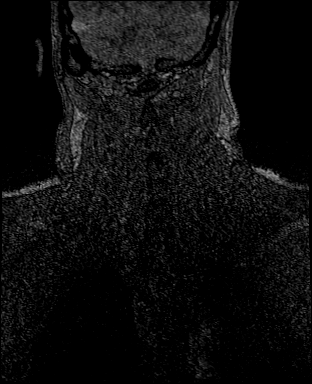

[Series 40: T1 post-contrast · axial · 1.0mm · 0.94mm/px · z∈[-53,+89]mm · 4 of 144 slices shown (1 of 3)]
[im 1/144]
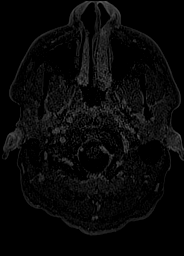
[im 48/144]
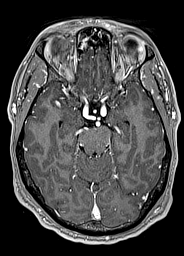
[im 96/144]
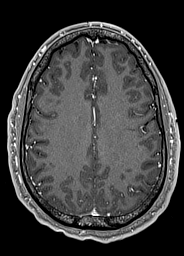
[im 144/144]
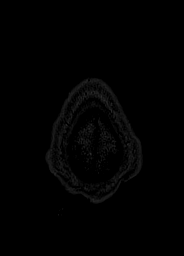

[Series 41: T1 post-contrast · coronal · 5.0mm · 0.43mm/px · 1 of 26 slices shown (2 of 3)]
[im 1/26]
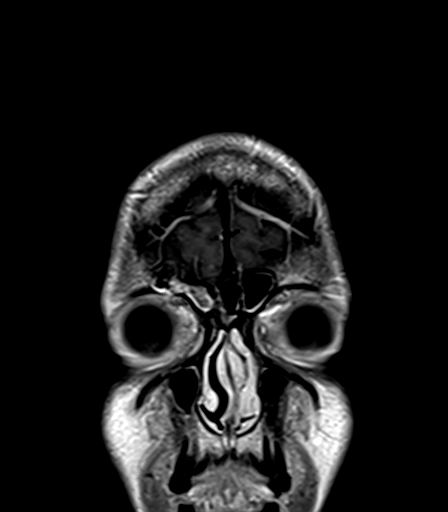

[Series 42: T1 post-contrast · sagittal · 5.0mm · 0.94mm/px · 1 of 24 slices shown (3 of 3)]
[im 1/24]
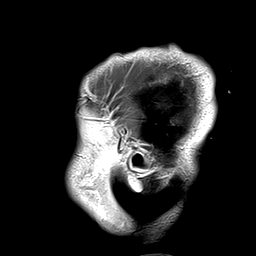

[Series 100: <mpr range> · axial · 1.1mm · 0.50mm/px · z∈[-85,+104]mm · 2 of 53 slices shown]
[im 1/53]
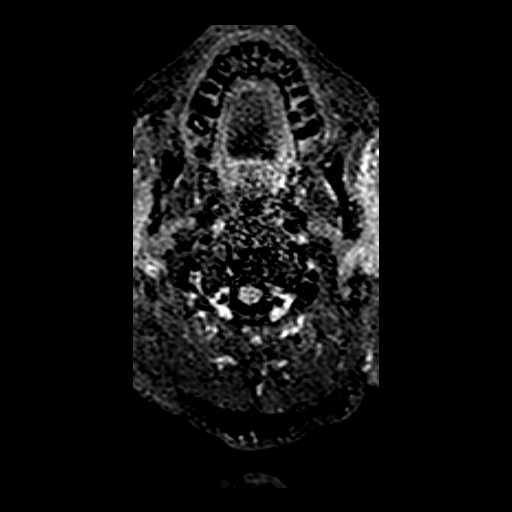
[im 53/53]
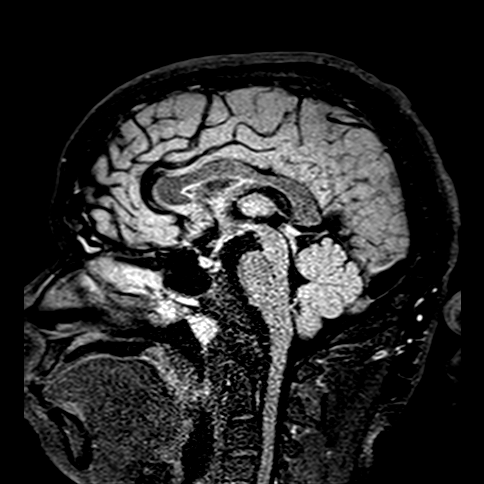

[35 of 48 positions shown; findings below may reference images not displayed]

FINDINGS: MRI HEAD FINDINGS

Brain: There is an acute right lateral lenticulostriate territory
infarct extending from the posterior aspect of the right lentiform
nucleus to the caudate body. This is separate from a chronic lacunar
infarct which is located more anteriorly in the right basal ganglia.
Scattered small foci of T2 hyperintensity in the cerebral white
matter bilaterally are moderately advanced for age and nonspecific
but compatible with chronic small vessel ischemic disease. No
intracranial hemorrhage, mass, midline shift, or extra-axial fluid
collection is identified. The ventricles and sulci are normal. No
abnormal enhancement is identified.

Vascular: Major intracranial vascular flow voids are preserved.

Skull and upper cervical spine: Unremarkable bone marrow signal.

Sinuses/Orbits: Unremarkable orbits. Moderate mucosal thickening in
the ethmoid sinuses with milder mucosal thickening in the other
paranasal sinuses and minimal fluid in the left sphenoid sinus.
Clear mastoid air cells.

Other: None.

MRA HEAD FINDINGS

The intracranial vertebral arteries are widely patent to the
basilar. Patent bilateral PICA, right AICA, and bilateral SCA
origins are identified. The basilar artery is widely patent. There
are right larger than left posterior communicating arteries with
absence of the right P1 segment, a normal variant. Both PCAs are
patent without evidence of a significant proximal stenosis.

The internal carotid arteries are widely patent from skull base to
carotid termini. ACAs and MCAs are patent without evidence of a
proximal branch occlusion or significant proximal stenosis. No
aneurysm is identified.

MRA NECK FINDINGS

There is a standard 3 vessel aortic arch. The brachiocephalic and
subclavian arteries are widely patent. The common carotid and
cervical internal carotid arteries are patent and smooth without
evidence of stenosis or dissection.

The vertebral arteries are patent with antegrade flow bilaterally
and with the left being mildly dominant. No significant vertebral
artery stenosis or dissection is identified.
IMPRESSION: 1. Acute right basal ganglia infarct.
2. Age advanced chronic small vessel ischemic disease.
3. Negative head MRA.
4. Negative neck MRA.

## 2020-10-02 IMAGING — MR MR MRA HEAD W/O CM
21 of 25 series · 35 of 48 positions shown · IV contrast (gadavist)
Comparison: Head CT [DATE] and head and neck CTA [DATE]

CLINICAL DATA: Left-sided weakness, facial droop, and slurred
speech. Accelerated hypertension.

EXAM:
MRI HEAD WITHOUT AND WITH CONTRAST
MRA HEAD WITHOUT CONTRAST
MRA NECK WITHOUT AND WITH CONTRAST
TECHNIQUE: Multiplanar, multiecho pulse sequences of the brain and surrounding
structures were obtained without and with intravenous contrast.
Angiographic images of the Circle of Willis were obtained using MRA
technique without intravenous contrast. Angiographic images of the
neck were obtained using MRA technique without and with intravenous
contrast. Carotid stenosis measurements (when applicable) are
obtained utilizing NASCET criteria, using the distal internal
carotid diameter as the denominator.
CONTRAST:  8mL GADAVIST GADOBUTROL 1 MMOL/ML IV SOLN

[Series 5: DWI · axial · 3.0mm · 1.36mm/px · z∈[-63,+89]mm · 2 of 104 slices shown (1 of 4)]
[im 1/104]
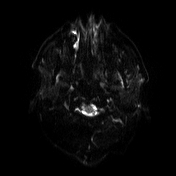
[im 104/104]
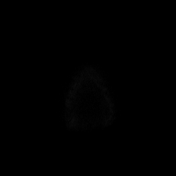

[Series 6: DWI · axial · 3.0mm · 1.36mm/px · 1 of 52 slices shown (2 of 4)]
[im 1/52]
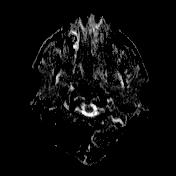

[Series 7: T1 · sagittal · 5.0mm · 0.75mm/px · 1 of 24 slices shown (1 of 2)]
[im 1/24]
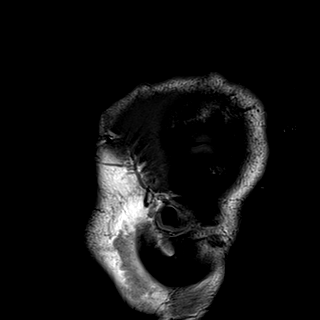

[Series 8: T2 · axial · 5.0mm · 0.62mm/px · 1 of 24 slices shown (1 of 2)]
[im 1/24]
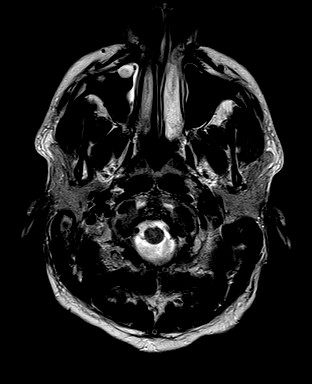

[Series 9: mip_images(sw) · axial · 24.0mm · 0.75mm/px · 1 of 45 slices shown]
[im 1/45]
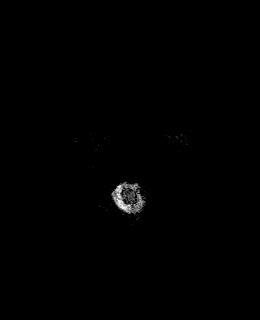

[Series 10: swi_images · axial · 3.0mm · 0.75mm/px · 1 of 52 slices shown]
[im 1/52]
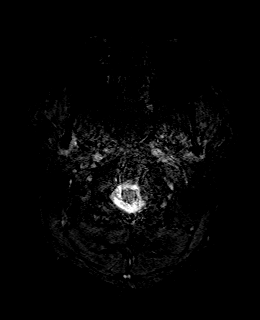

[Series 11: FLAIR · axial · 3.0mm · 0.75mm/px · 1 of 52 slices shown (1 of 2)]
[im 1/52]
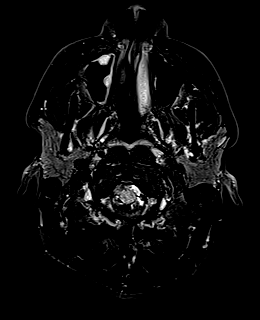

[Series 12: T1 · axial · 3.0mm · 0.47mm/px · 1 of 50 slices shown (2 of 2)]
[im 1/50]
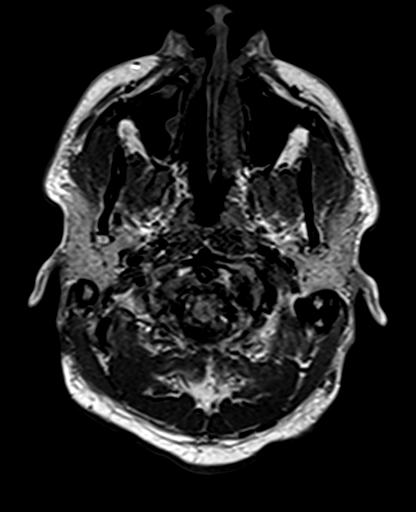

[Series 13: FLAIR · sagittal · 1.1mm · 0.50mm/px · 2 of 112 slices shown (2 of 2)]
[im 1/112]
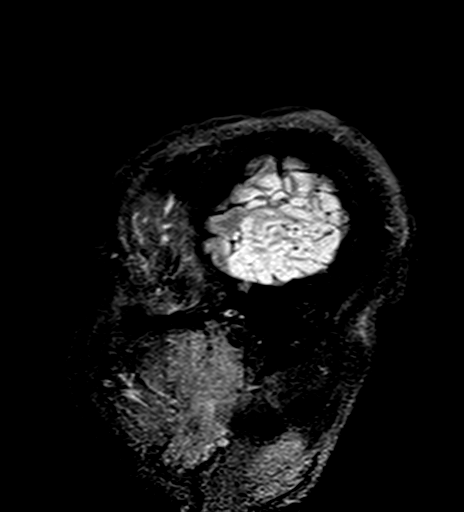
[im 112/112]
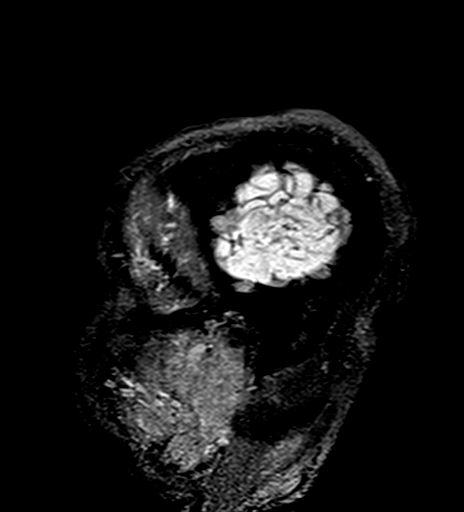

[Series 14: DWI · coronal · 5.0mm · 1.31mm/px · 2 of 68 slices shown (3 of 4)]
[im 1/68]
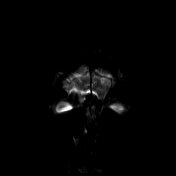
[im 68/68]
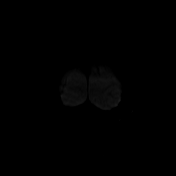

[Series 15: DWI · coronal · 5.0mm · 1.31mm/px · 1 of 34 slices shown (4 of 4)]
[im 1/34]
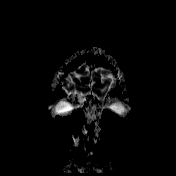

[Series 16: T2 · coronal · 5.0mm · 0.69mm/px · 1 of 26 slices shown (2 of 2)]
[im 1/26]
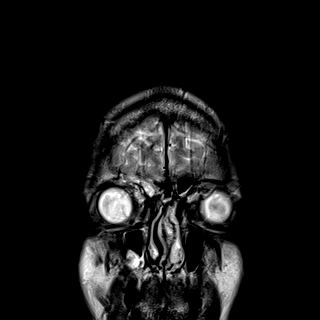

[Series 31: tof_2d_tra · axial · 3.5mm · 0.43mm/px · z∈[-202,-58]mm · 2 of 60 slices shown]
[im 1/60]
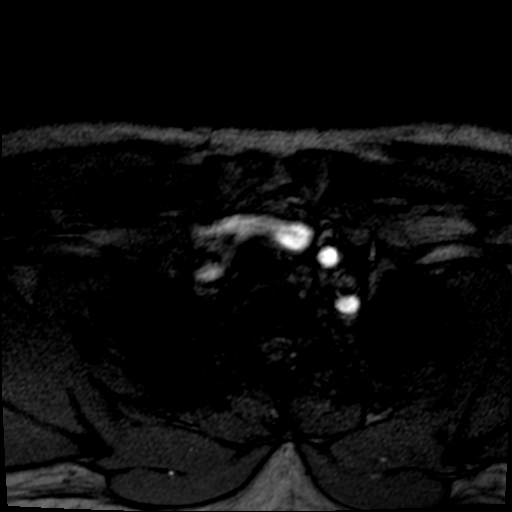
[im 60/60]
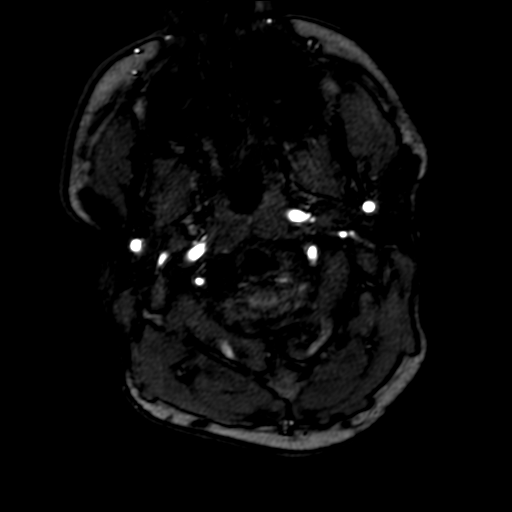

[Series 34: tof_2d_tra_mip_tra · axial · 148.1mm · 0.43mm/px · 1 of 1 slices shown]
[im 1/1]
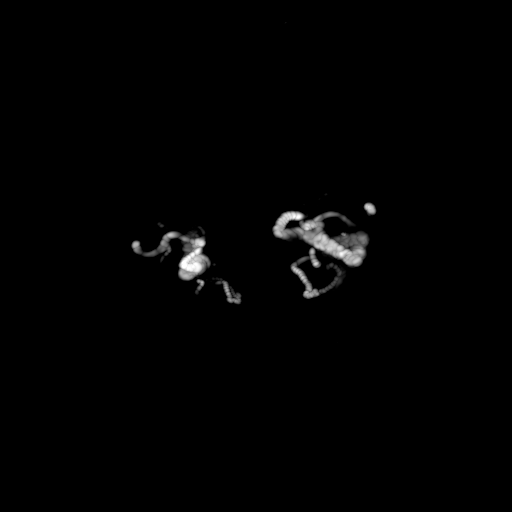

[Series 35: (id)_cor_pre · coronal · 0.8mm · 0.78mm/px · 3 of 96 slices shown]
[im 1/96]
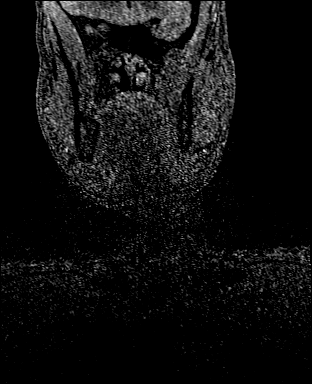
[im 48/96]
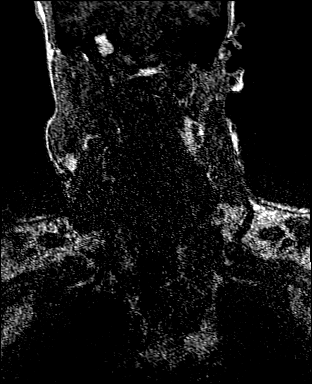
[im 96/96]
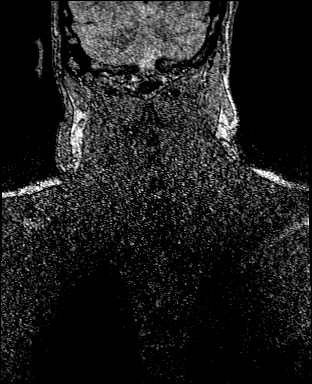

[Series 37: (id)_cor_post · coronal · 0.8mm · 0.78mm/px · 3 of 89 slices shown]
[im 1/89]
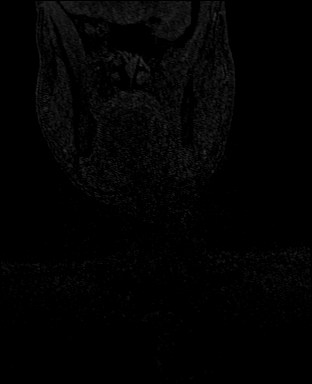
[im 45/89]
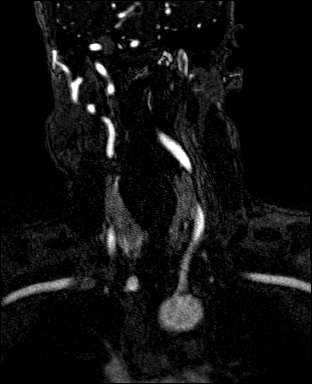
[im 89/89]
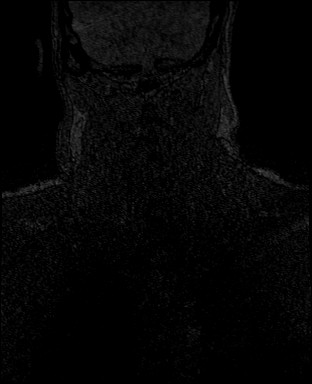

[Series 38: (id)_cor_post_venous · coronal · 0.8mm · 0.78mm/px · 3 of 96 slices shown]
[im 1/96]
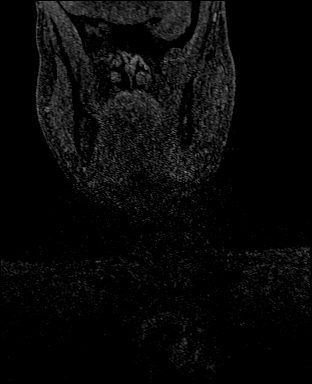
[im 48/96]
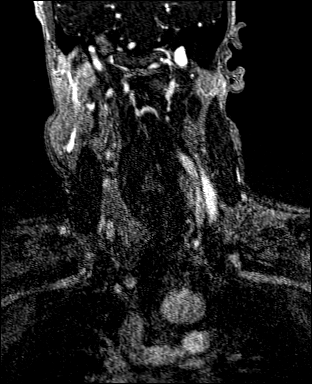
[im 96/96]
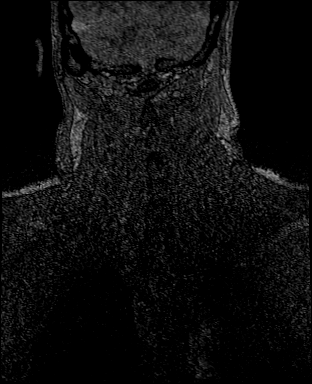

[Series 40: T1 post-contrast · axial · 1.0mm · 0.94mm/px · z∈[-53,+89]mm · 4 of 144 slices shown (1 of 3)]
[im 1/144]
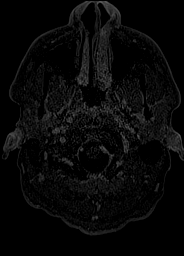
[im 48/144]
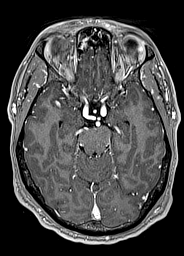
[im 96/144]
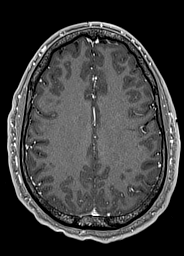
[im 144/144]
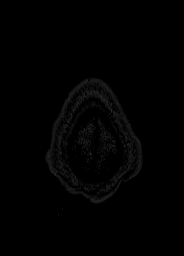

[Series 41: T1 post-contrast · coronal · 5.0mm · 0.43mm/px · 1 of 26 slices shown (2 of 3)]
[im 1/26]
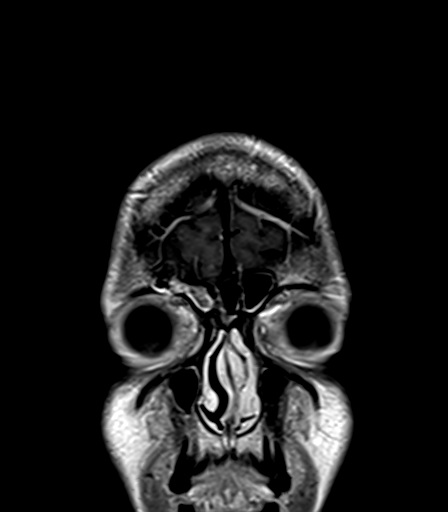

[Series 42: T1 post-contrast · sagittal · 5.0mm · 0.94mm/px · 1 of 24 slices shown (3 of 3)]
[im 1/24]
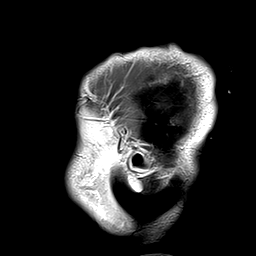

[Series 100: <mpr range> · axial · 1.1mm · 0.50mm/px · z∈[-85,+104]mm · 2 of 53 slices shown]
[im 1/53]
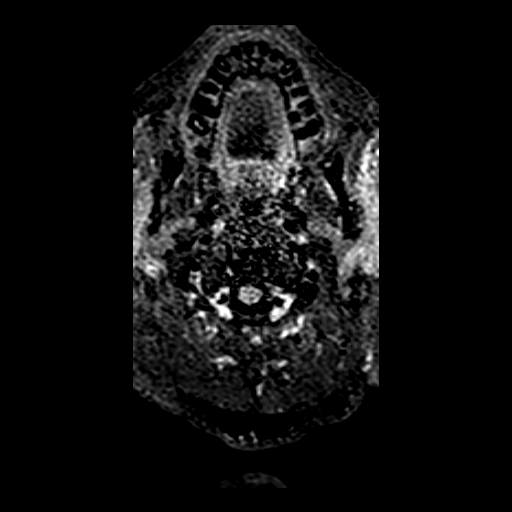
[im 53/53]
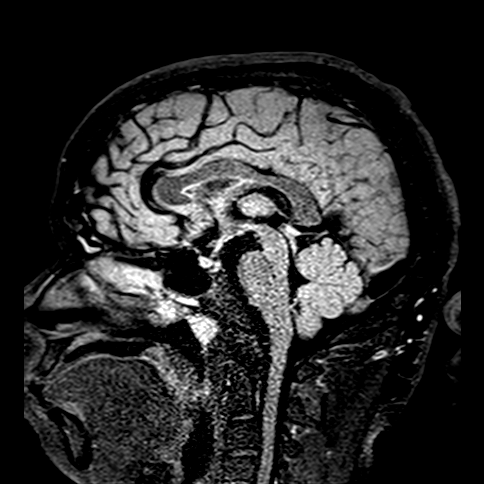

[35 of 48 positions shown; findings below may reference images not displayed]

FINDINGS: MRI HEAD FINDINGS

Brain: There is an acute right lateral lenticulostriate territory
infarct extending from the posterior aspect of the right lentiform
nucleus to the caudate body. This is separate from a chronic lacunar
infarct which is located more anteriorly in the right basal ganglia.
Scattered small foci of T2 hyperintensity in the cerebral white
matter bilaterally are moderately advanced for age and nonspecific
but compatible with chronic small vessel ischemic disease. No
intracranial hemorrhage, mass, midline shift, or extra-axial fluid
collection is identified. The ventricles and sulci are normal. No
abnormal enhancement is identified.

Vascular: Major intracranial vascular flow voids are preserved.

Skull and upper cervical spine: Unremarkable bone marrow signal.

Sinuses/Orbits: Unremarkable orbits. Moderate mucosal thickening in
the ethmoid sinuses with milder mucosal thickening in the other
paranasal sinuses and minimal fluid in the left sphenoid sinus.
Clear mastoid air cells.

Other: None.

MRA HEAD FINDINGS

The intracranial vertebral arteries are widely patent to the
basilar. Patent bilateral PICA, right AICA, and bilateral SCA
origins are identified. The basilar artery is widely patent. There
are right larger than left posterior communicating arteries with
absence of the right P1 segment, a normal variant. Both PCAs are
patent without evidence of a significant proximal stenosis.

The internal carotid arteries are widely patent from skull base to
carotid termini. ACAs and MCAs are patent without evidence of a
proximal branch occlusion or significant proximal stenosis. No
aneurysm is identified.

MRA NECK FINDINGS

There is a standard 3 vessel aortic arch. The brachiocephalic and
subclavian arteries are widely patent. The common carotid and
cervical internal carotid arteries are patent and smooth without
evidence of stenosis or dissection.

The vertebral arteries are patent with antegrade flow bilaterally
and with the left being mildly dominant. No significant vertebral
artery stenosis or dissection is identified.
IMPRESSION: 1. Acute right basal ganglia infarct.
2. Age advanced chronic small vessel ischemic disease.
3. Negative head MRA.
4. Negative neck MRA.

## 2020-10-02 IMAGING — CT CT HEAD CODE STROKE
3 series · 14 of 47 positions shown, 16 images · non-contrast
Comparison: CTA head and neck, head CT [FB] hours last night.

CLINICAL DATA: Code stroke. 34-year-old male with facial droop upon
waking.

EXAM:
CT HEAD WITHOUT CONTRAST
TECHNIQUE: Contiguous axial images were obtained from the base of the skull
through the vertex without intravenous contrast.

[Series 3: head wo · axial · 0.47mm/px · z∈[-195,-50]mm · 8 of 35 slices shown, 10 images]
[im 3/35  brain]
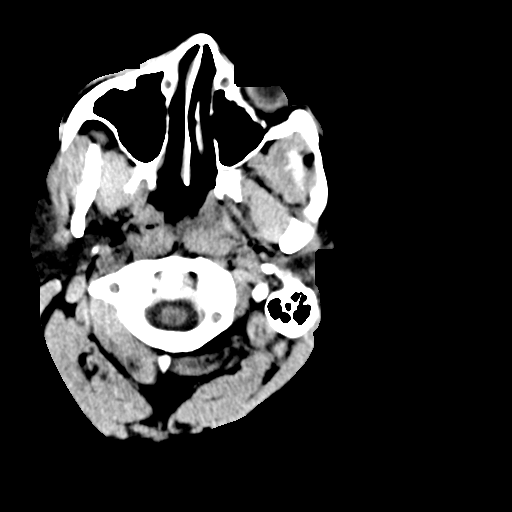
[im 3/35  bone]
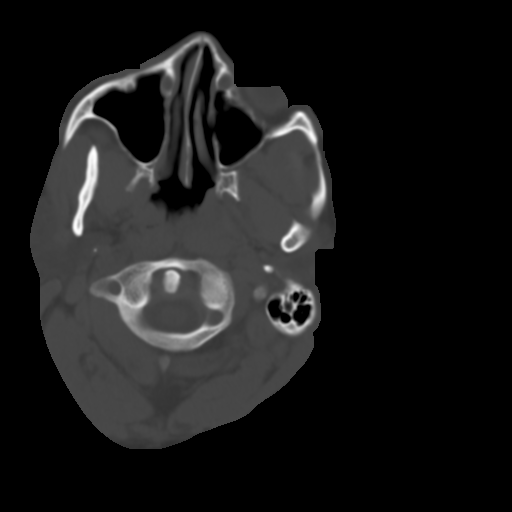
[im 8/35  brain]
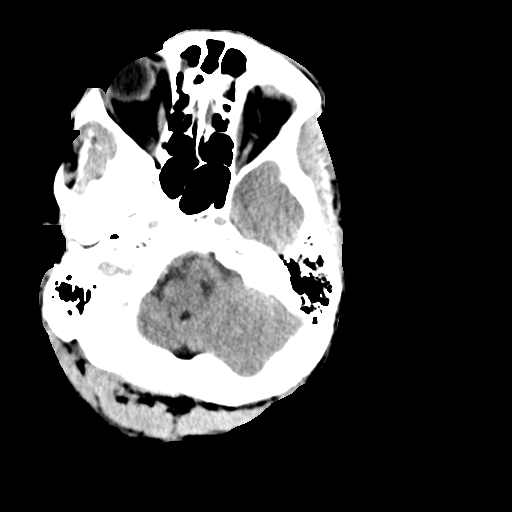
[im 11/35  brain]
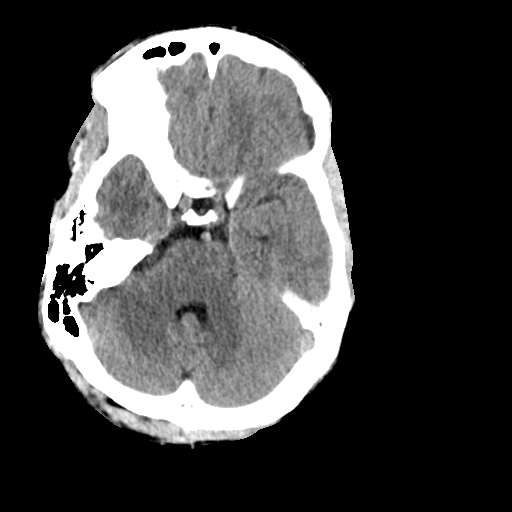
[im 16/35  brain]
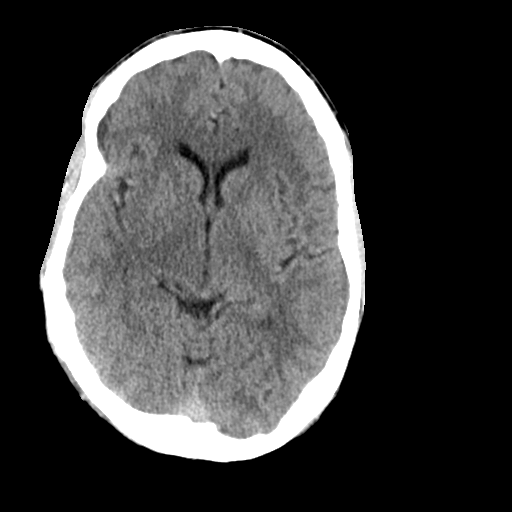
[im 19/35  brain]
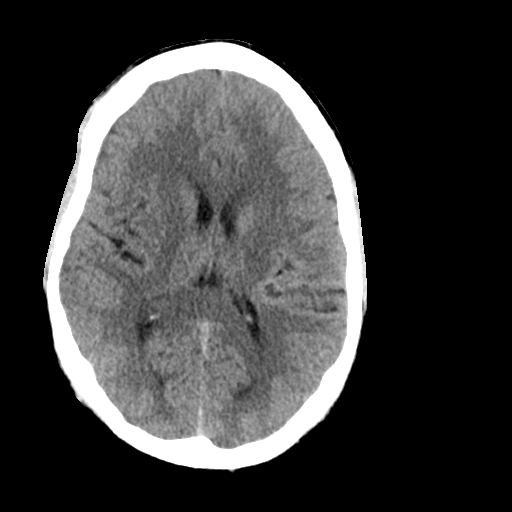
[im 19/35  bone]
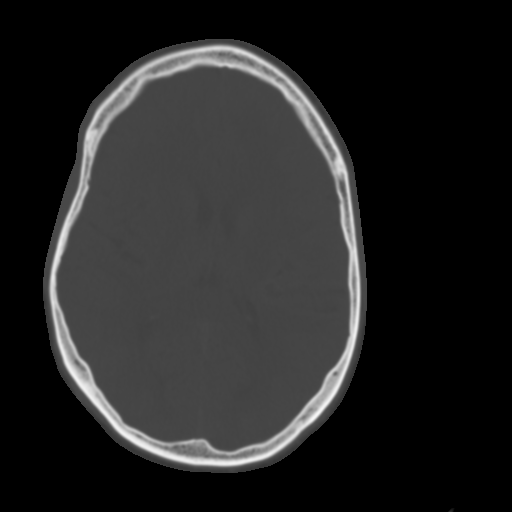
[im 24/35  brain]
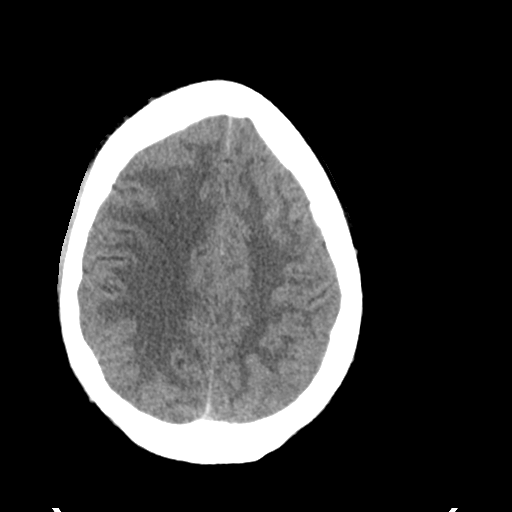
[im 27/35  brain]
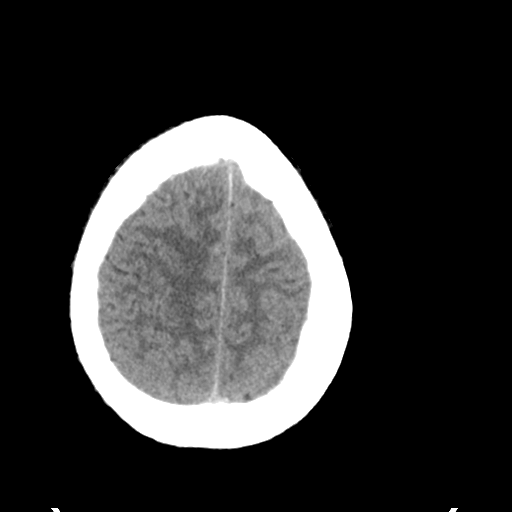
[im 32/35  brain]
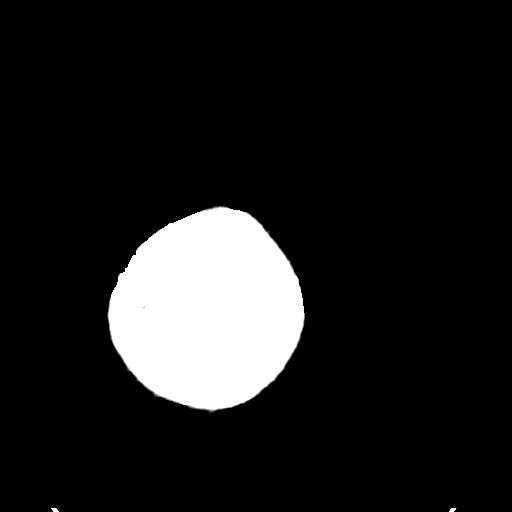

[Series 5: coronal soft tissue · coronal · 0.34mm/px · 3 of 74 slices shown]
[im 25/74  brain]
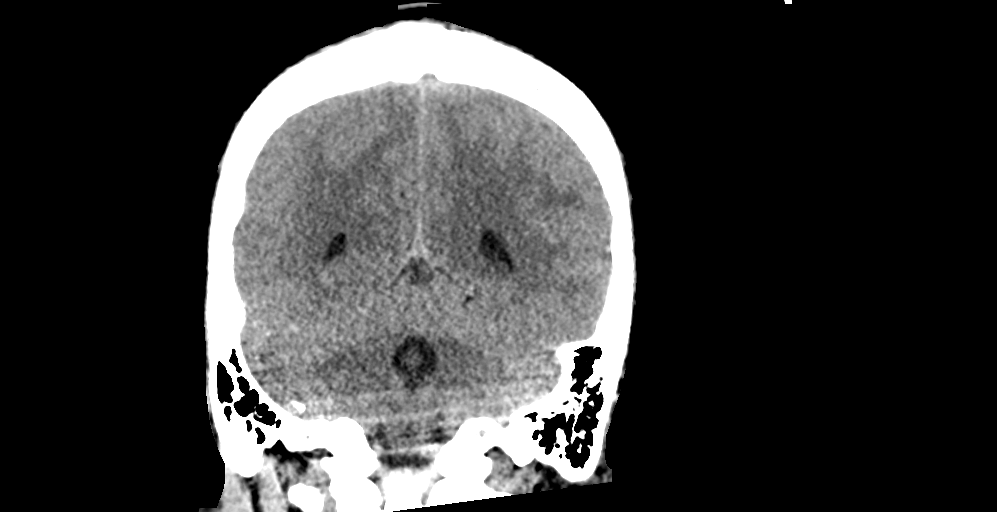
[im 33/74  brain]
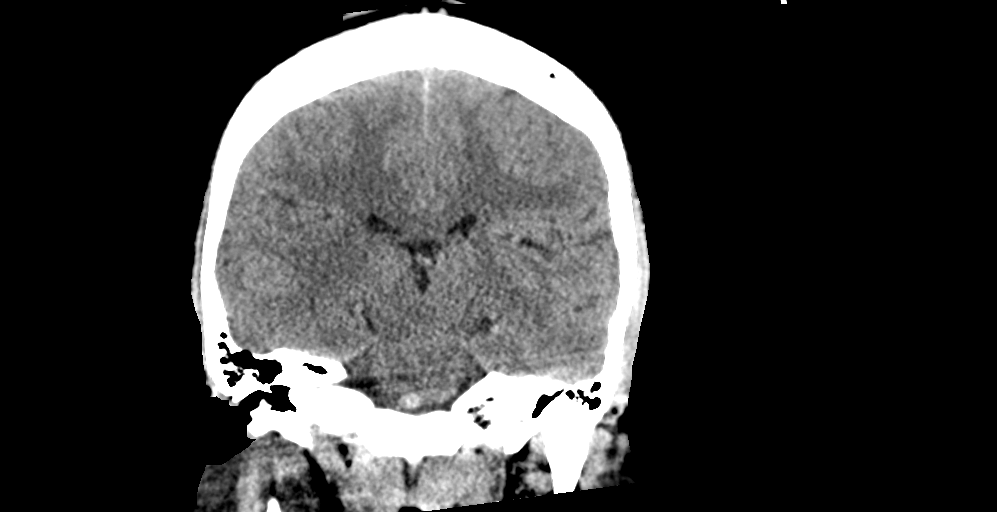
[im 41/74  brain]
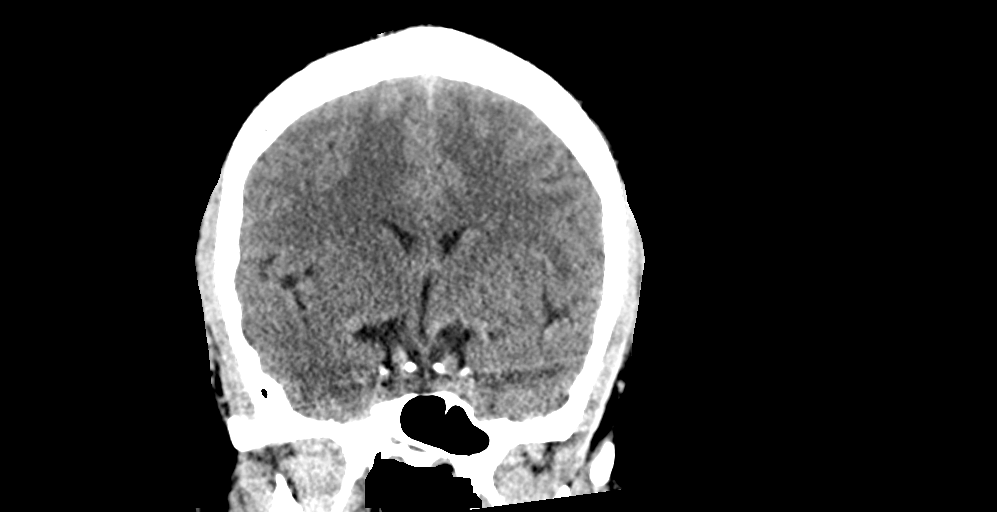

[Series 6: sagittal soft tissue · sagittal · 0.34mm/px · 3 of 52 slices shown]
[im 20/52  brain]
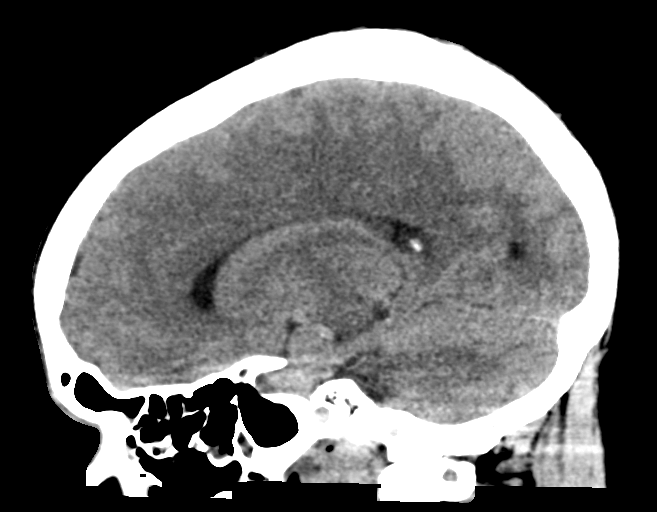
[im 26/52  brain]
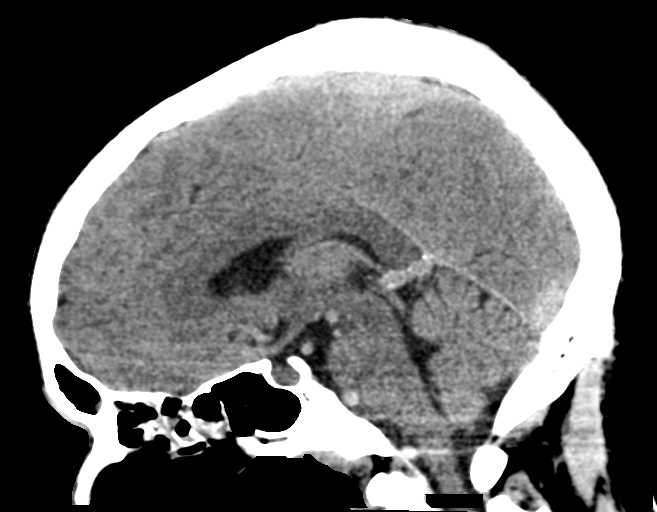
[im 33/52  brain]
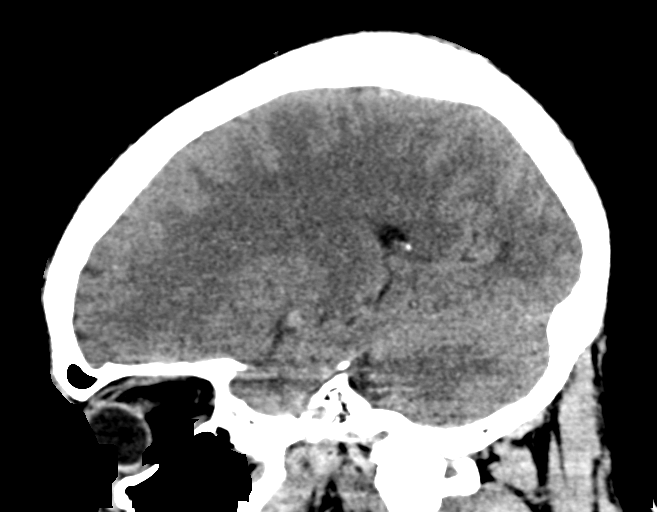

[14 of 47 positions shown; findings below may reference images not displayed]

FINDINGS: Brain: Heterogeneity of deep cerebral white matter again noted
compatible with age advanced chronic small vessel disease. Stable
hypodensity in the anterior limb right internal capsule. Gray-white
matter differentiation appears stable since [DATE] hours. No
midline shift, ventriculomegaly, mass effect, evidence of mass
lesion, intracranial hemorrhage or evidence of cortically based
acute infarction.

Vascular: Trace residual intravascular contrast. No suspicious
vessel finding

Skull: No acute osseous abnormality identified.

Sinuses/Orbits: Stable scattered sinus mucosal thickening and bubbly
opacity. Tympanic cavities and mastoids are clear.

Other: No acute orbit or scalp soft tissue finding.

ASPECTS ([place] Stroke Program Early CT Score)

Total score (0-10 with 10 being normal): 10
IMPRESSION: 1. Stable non contrast CT appearance of the brain since [FB] hours
last night. Suspect age advanced small vessel disease.
2. ASPECTS 10.

## 2020-10-02 IMAGING — MR MR MRA NECK WO/W CM
21 of 25 series · 35 of 48 positions shown · IV contrast (gadavist)
Comparison: Head CT [DATE] and head and neck CTA [DATE]

CLINICAL DATA: Left-sided weakness, facial droop, and slurred
speech. Accelerated hypertension.

EXAM:
MRI HEAD WITHOUT AND WITH CONTRAST
MRA HEAD WITHOUT CONTRAST
MRA NECK WITHOUT AND WITH CONTRAST
TECHNIQUE: Multiplanar, multiecho pulse sequences of the brain and surrounding
structures were obtained without and with intravenous contrast.
Angiographic images of the Circle of Willis were obtained using MRA
technique without intravenous contrast. Angiographic images of the
neck were obtained using MRA technique without and with intravenous
contrast. Carotid stenosis measurements (when applicable) are
obtained utilizing NASCET criteria, using the distal internal
carotid diameter as the denominator.
CONTRAST:  8mL GADAVIST GADOBUTROL 1 MMOL/ML IV SOLN

[Series 5: DWI · axial · 3.0mm · 1.36mm/px · z∈[-63,+89]mm · 2 of 104 slices shown (1 of 4)]
[im 1/104]
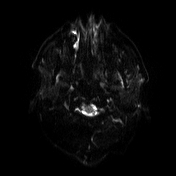
[im 104/104]
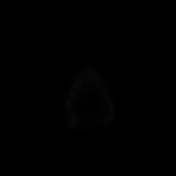

[Series 6: DWI · axial · 3.0mm · 1.36mm/px · 1 of 52 slices shown (2 of 4)]
[im 1/52]
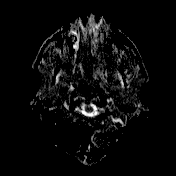

[Series 7: T1 · sagittal · 5.0mm · 0.75mm/px · 1 of 24 slices shown (1 of 2)]
[im 1/24]
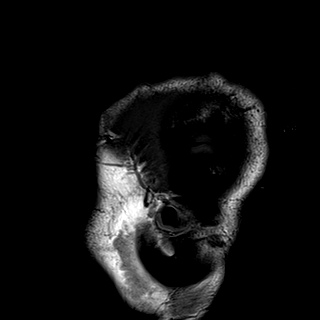

[Series 8: T2 · axial · 5.0mm · 0.62mm/px · 1 of 24 slices shown (1 of 2)]
[im 1/24]
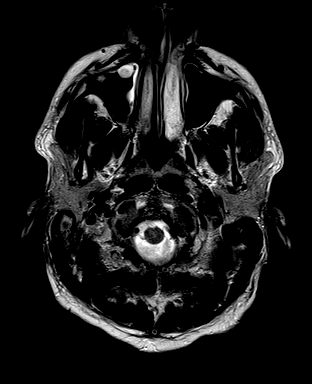

[Series 9: mip_images(sw) · axial · 24.0mm · 0.75mm/px · 1 of 45 slices shown]
[im 1/45]
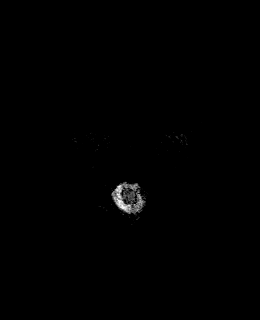

[Series 10: swi_images · axial · 3.0mm · 0.75mm/px · 1 of 52 slices shown]
[im 1/52]
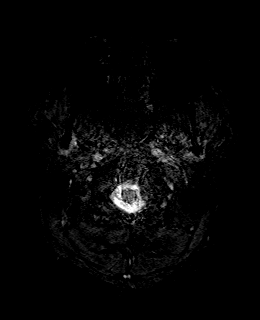

[Series 11: FLAIR · axial · 3.0mm · 0.75mm/px · 1 of 52 slices shown (1 of 2)]
[im 1/52]
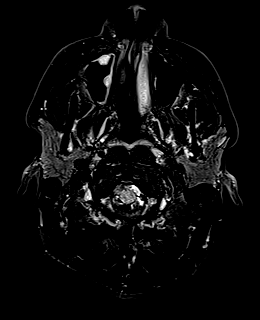

[Series 12: T1 · axial · 3.0mm · 0.47mm/px · 1 of 50 slices shown (2 of 2)]
[im 1/50]
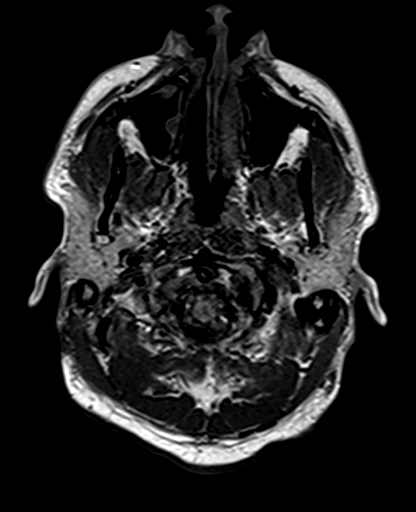

[Series 13: FLAIR · sagittal · 1.1mm · 0.50mm/px · 2 of 112 slices shown (2 of 2)]
[im 1/112]
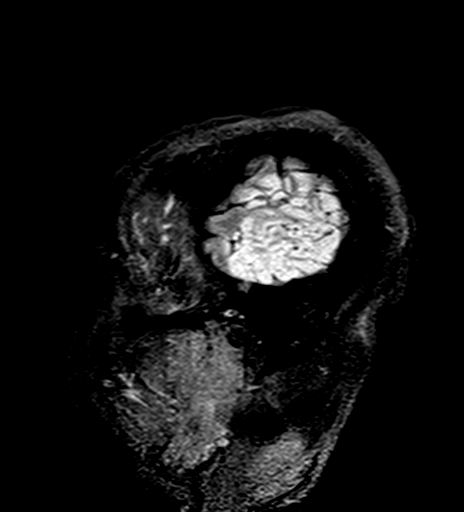
[im 112/112]
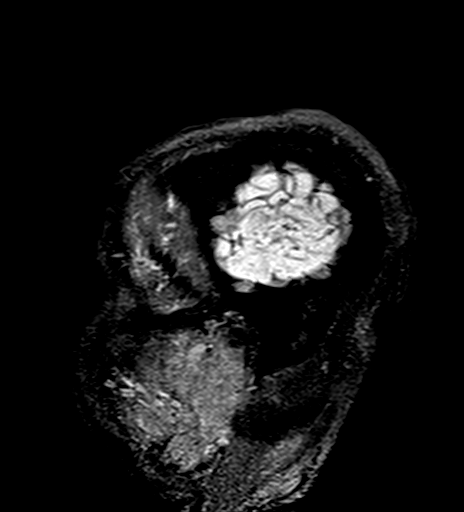

[Series 14: DWI · coronal · 5.0mm · 1.31mm/px · 2 of 68 slices shown (3 of 4)]
[im 1/68]
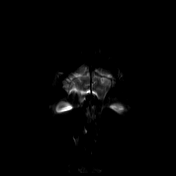
[im 68/68]
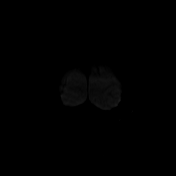

[Series 15: DWI · coronal · 5.0mm · 1.31mm/px · 1 of 34 slices shown (4 of 4)]
[im 1/34]
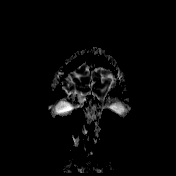

[Series 16: T2 · coronal · 5.0mm · 0.69mm/px · 1 of 26 slices shown (2 of 2)]
[im 1/26]
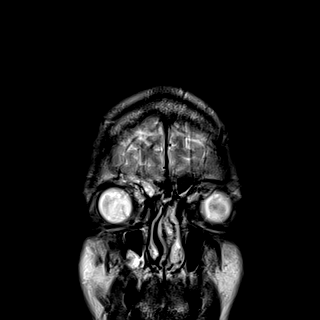

[Series 31: tof_2d_tra · axial · 3.5mm · 0.43mm/px · z∈[-202,-58]mm · 2 of 60 slices shown]
[im 1/60]
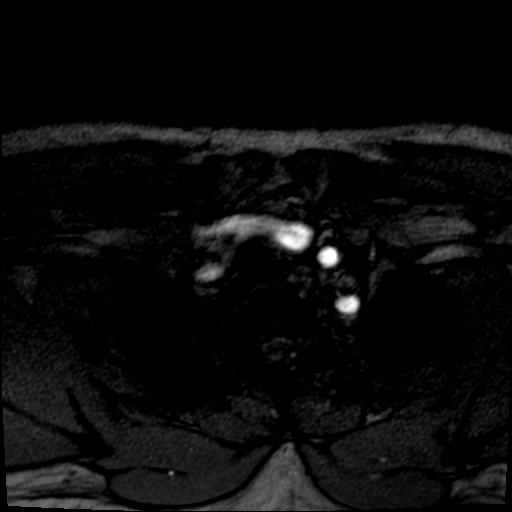
[im 60/60]
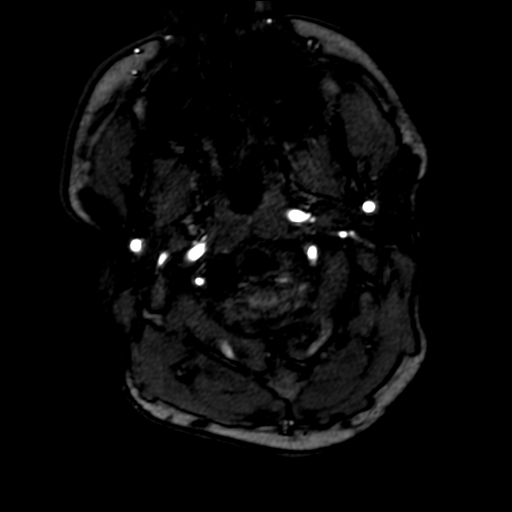

[Series 34: tof_2d_tra_mip_tra · axial · 148.1mm · 0.43mm/px · 1 of 1 slices shown]
[im 1/1]
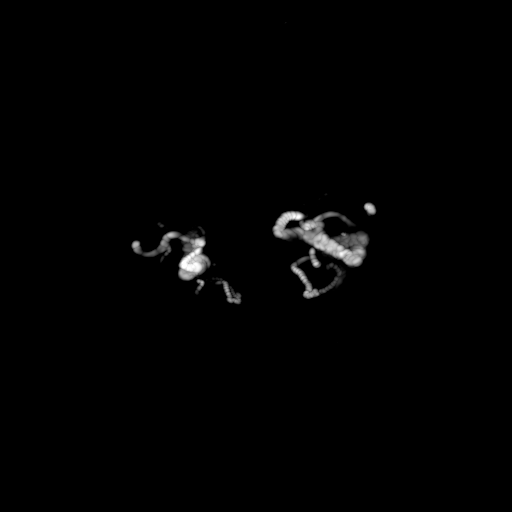

[Series 35: (id)_cor_pre · coronal · 0.8mm · 0.78mm/px · 3 of 96 slices shown]
[im 1/96]
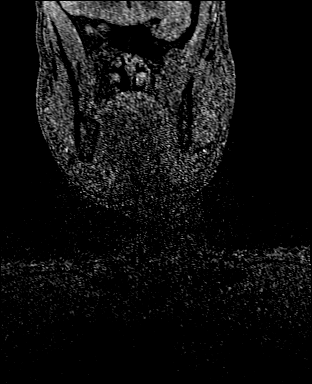
[im 48/96]
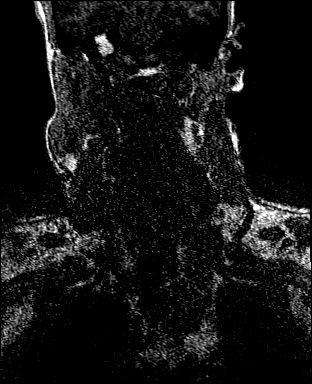
[im 96/96]
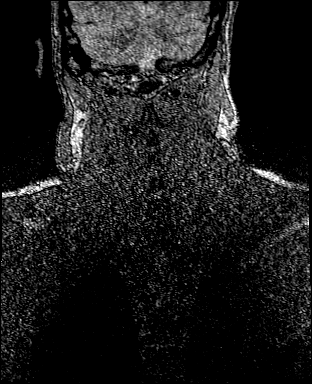

[Series 37: (id)_cor_post · coronal · 0.8mm · 0.78mm/px · 3 of 89 slices shown]
[im 1/89]
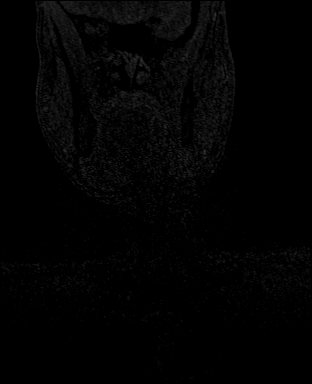
[im 45/89]
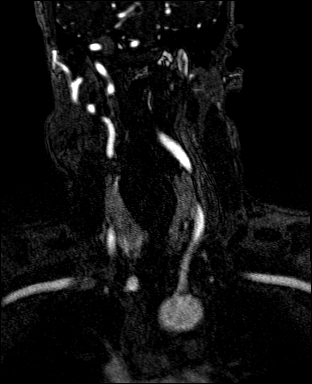
[im 89/89]
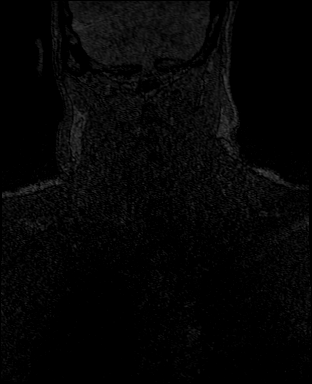

[Series 38: (id)_cor_post_venous · coronal · 0.8mm · 0.78mm/px · 3 of 96 slices shown]
[im 1/96]
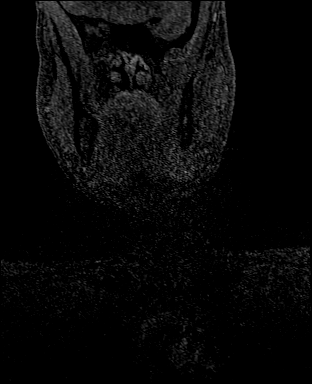
[im 48/96]
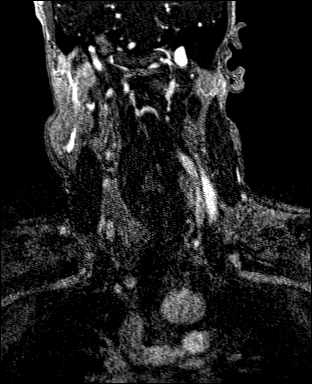
[im 96/96]
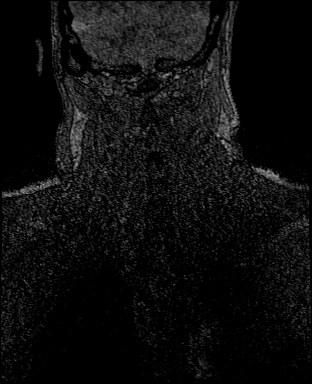

[Series 40: T1 post-contrast · axial · 1.0mm · 0.94mm/px · z∈[-53,+89]mm · 4 of 144 slices shown (1 of 3)]
[im 1/144]
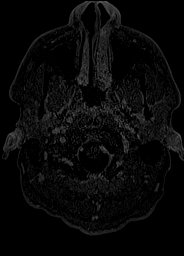
[im 48/144]
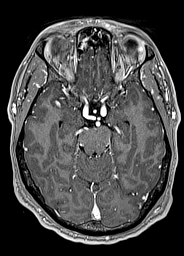
[im 96/144]
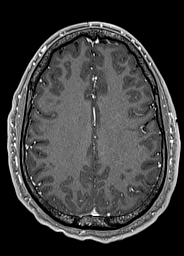
[im 144/144]
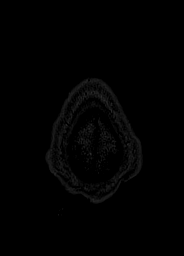

[Series 41: T1 post-contrast · coronal · 5.0mm · 0.43mm/px · 1 of 26 slices shown (2 of 3)]
[im 1/26]
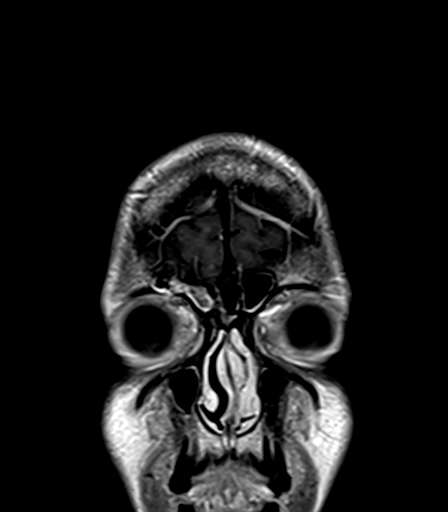

[Series 42: T1 post-contrast · sagittal · 5.0mm · 0.94mm/px · 1 of 24 slices shown (3 of 3)]
[im 1/24]
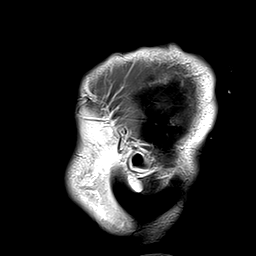

[Series 100: <mpr range> · axial · 1.1mm · 0.50mm/px · z∈[-85,+104]mm · 2 of 53 slices shown]
[im 1/53]
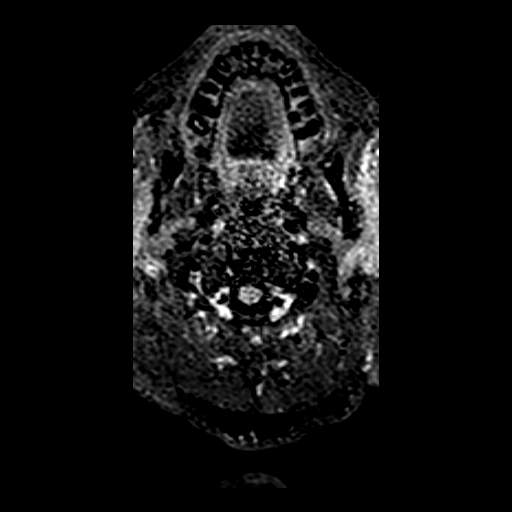
[im 53/53]
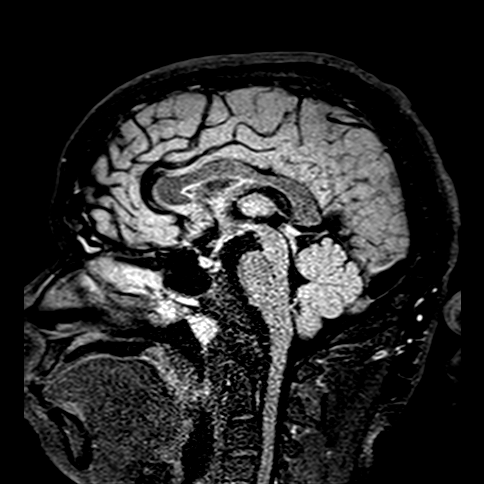

[35 of 48 positions shown; findings below may reference images not displayed]

FINDINGS: MRI HEAD FINDINGS

Brain: There is an acute right lateral lenticulostriate territory
infarct extending from the posterior aspect of the right lentiform
nucleus to the caudate body. This is separate from a chronic lacunar
infarct which is located more anteriorly in the right basal ganglia.
Scattered small foci of T2 hyperintensity in the cerebral white
matter bilaterally are moderately advanced for age and nonspecific
but compatible with chronic small vessel ischemic disease. No
intracranial hemorrhage, mass, midline shift, or extra-axial fluid
collection is identified. The ventricles and sulci are normal. No
abnormal enhancement is identified.

Vascular: Major intracranial vascular flow voids are preserved.

Skull and upper cervical spine: Unremarkable bone marrow signal.

Sinuses/Orbits: Unremarkable orbits. Moderate mucosal thickening in
the ethmoid sinuses with milder mucosal thickening in the other
paranasal sinuses and minimal fluid in the left sphenoid sinus.
Clear mastoid air cells.

Other: None.

MRA HEAD FINDINGS

The intracranial vertebral arteries are widely patent to the
basilar. Patent bilateral PICA, right AICA, and bilateral SCA
origins are identified. The basilar artery is widely patent. There
are right larger than left posterior communicating arteries with
absence of the right P1 segment, a normal variant. Both PCAs are
patent without evidence of a significant proximal stenosis.

The internal carotid arteries are widely patent from skull base to
carotid termini. ACAs and MCAs are patent without evidence of a
proximal branch occlusion or significant proximal stenosis. No
aneurysm is identified.

MRA NECK FINDINGS

There is a standard 3 vessel aortic arch. The brachiocephalic and
subclavian arteries are widely patent. The common carotid and
cervical internal carotid arteries are patent and smooth without
evidence of stenosis or dissection.

The vertebral arteries are patent with antegrade flow bilaterally
and with the left being mildly dominant. No significant vertebral
artery stenosis or dissection is identified.
IMPRESSION: 1. Acute right basal ganglia infarct.
2. Age advanced chronic small vessel ischemic disease.
3. Negative head MRA.
4. Negative neck MRA.

## 2020-10-02 MED ORDER — CLOPIDOGREL BISULFATE 75 MG PO TABS
75.0000 mg | ORAL_TABLET | Freq: Every day | ORAL | Status: DC
  Administered 2020-10-03 – 2020-10-04 (×2): 75 mg via ORAL
  Filled 2020-10-02 (×2): qty 1

## 2020-10-02 MED ORDER — HYDROCHLOROTHIAZIDE 12.5 MG PO CAPS
12.5000 mg | ORAL_CAPSULE | Freq: Every day | ORAL | Status: DC
  Administered 2020-10-02: 12.5 mg via ORAL
  Filled 2020-10-02: qty 1

## 2020-10-02 MED ORDER — POTASSIUM CHLORIDE CRYS ER 10 MEQ PO TBCR: 10.0000 meq | EXTENDED_RELEASE_TABLET | Freq: Every day | ORAL | Status: DC

## 2020-10-02 MED ORDER — ACETAMINOPHEN 325 MG PO TABS
650.0000 mg | ORAL_TABLET | ORAL | Status: DC | PRN
  Administered 2020-10-02 (×2): 650 mg via ORAL
  Filled 2020-10-02 (×2): qty 2

## 2020-10-02 MED ORDER — ACETAMINOPHEN 650 MG RE SUPP: 650.0000 mg | RECTAL | Status: DC | PRN

## 2020-10-02 MED ORDER — ASPIRIN 81 MG PO CHEW
81.0000 mg | CHEWABLE_TABLET | Freq: Every day | ORAL | Status: DC
  Administered 2020-10-02 – 2020-10-04 (×3): 81 mg via ORAL
  Filled 2020-10-02 (×3): qty 1

## 2020-10-02 MED ORDER — SODIUM CHLORIDE 0.9 % IV SOLN: INTRAVENOUS | Status: DC

## 2020-10-02 MED ORDER — GADOBUTROL 1 MMOL/ML IV SOLN
8.0000 mL | Freq: Once | INTRAVENOUS | Status: AC | PRN
  Administered 2020-10-02: 8 mL via INTRAVENOUS

## 2020-10-02 MED ORDER — ACETAMINOPHEN 160 MG/5ML PO SOLN: 650.0000 mg | ORAL | Status: DC | PRN

## 2020-10-02 MED ORDER — ENOXAPARIN SODIUM 40 MG/0.4ML ~~LOC~~ SOLN
40.0000 mg | SUBCUTANEOUS | Status: DC
  Administered 2020-10-02 – 2020-10-03 (×2): 40 mg via SUBCUTANEOUS
  Filled 2020-10-02 (×2): qty 0.4

## 2020-10-02 MED ORDER — STROKE: EARLY STAGES OF RECOVERY BOOK
Freq: Once | Status: DC
  Filled 2020-10-02: qty 1

## 2020-10-02 MED ORDER — POTASSIUM CHLORIDE CRYS ER 20 MEQ PO TBCR
20.0000 meq | EXTENDED_RELEASE_TABLET | Freq: Once | ORAL | Status: AC
  Administered 2020-10-02: 20 meq via ORAL
  Filled 2020-10-02: qty 1

## 2020-10-02 MED ORDER — ATORVASTATIN CALCIUM 40 MG PO TABS
40.0000 mg | ORAL_TABLET | Freq: Every day | ORAL | Status: DC
  Administered 2020-10-02 – 2020-10-04 (×3): 40 mg via ORAL
  Filled 2020-10-02 (×3): qty 1

## 2020-10-02 MED ORDER — LOSARTAN POTASSIUM 25 MG PO TABS
25.0000 mg | ORAL_TABLET | Freq: Every day | ORAL | Status: DC
  Administered 2020-10-02 – 2020-10-04 (×3): 25 mg via ORAL
  Filled 2020-10-02 (×3): qty 1

## 2020-10-02 MED ORDER — CLOPIDOGREL BISULFATE 300 MG PO TABS
300.0000 mg | ORAL_TABLET | Freq: Once | ORAL | Status: AC
  Administered 2020-10-02: 300 mg via ORAL
  Filled 2020-10-02: qty 1

## 2020-10-02 MED ORDER — HYDRALAZINE HCL 20 MG/ML IJ SOLN
5.0000 mg | Freq: Four times a day (QID) | INTRAMUSCULAR | Status: DC | PRN
  Administered 2020-10-02 – 2020-10-04 (×4): 5 mg via INTRAVENOUS
  Filled 2020-10-02 (×3): qty 1

## 2020-10-02 NOTE — Consult Note (Signed)
Neurology Consult Note  HPI: Per patient, around 1930 last pm, he was talking to his father when he had acute slurred speech and L sided weakness. His father stated he had a facial droop. Patient came to ED. His symptoms resolved, but then returned around 0450 in the ED-same symptoms. No further occurrences here and he reports that his weakness to left arm and leg, facial droop, and slurred speech have resolved. Teleneurology consult was obtained at the [facility] ED with recommendation for full stroke work up. The patient was loaded with Plavix, followed by Plavix 75mg  po qd. ASA 81mg  also started. He was not a candidate for tPA due to time criteria. His symptoms and signs were not consistent with an LVO.   LKW 10/02/20.   He denies any vision changes with events. No HA, n/v, or numbness and tingling.   Workup since admission-CTH with finding of old lacunar infact but no acute findings. MRI brain with acute right basal ganglia infarct. MRA head and neck revealed no LVO. LDL 104. K 3.2. INR 1.1. Glucose 119.   TTE: 1. Left ventricular ejection fraction, by estimation, is 55 to 60%. The  left ventricle has normal function. The left ventricle has no regional  wall motion abnormalities. Left ventricular diastolic parameters were  normal.  2. Right ventricular systolic function is normal. The right ventricular  size is normal.  3. The mitral valve is normal in structure. No evidence of mitral valve  regurgitation. No evidence of mitral stenosis.  4. The aortic valve is normal in structure. Aortic valve regurgitation is  not visualized. No aortic stenosis is present.  5. The inferior vena cava is normal in size with greater than 50%  respiratory variability, suggesting right atrial pressure of 3 mmHg.   PMHx History reviewed. No pertinent past medical history.  O: Current vital signs: BP (!) 153/118   Pulse 88   Temp 98.5 F (36.9 C) (Oral)   Resp 18   SpO2 100%  Vital signs in last 24  hours: Temp:  [98.5 F (36.9 C)] 98.5 F (36.9 C) (02/08 2037) Pulse Rate:  [66-105] 88 (02/09 1100) Resp:  [12-26] 18 (02/09 1100) BP: (151-195)/(92-152) 153/118 (02/09 1100) SpO2:  [96 %-100 %] 100 % (02/09 1100)  GENERAL: Awake, alert in NAD Psych: affect appropriate to situation.  HEENT: Normocephalic and atraumatic.  LUNGS: Normal respiratory effort.  CV: RRR on tele.  ABDOMEN: Soft, nontender Ext: warm  NIHSS: 1a Level of Conscious: 0 1b LOC Questions: 0 1c LOC Commands: 0 2 Best Gaze: 0 3 Visual: 0 4 Facial Palsy: 0 5a Motor Arm - left: 0 5b Motor Arm - Right: 0 6a Motor Leg - Left: 0 6b Motor Leg - Right: 0 7 Limb Ataxia: 0 8 Sensory: 0 9 Best Language: 0 10 Dysarthria: 0 11 Extinct. and Inatten: 0 TOTAL: 0  NEURO:  Mental Status: AA&Ox3  Speech/Language: speech is fluent without dysarthria or aphasia .  Naming, repetition, fluency, and comprehension intact. Cranial Nerves:  II: PERRL. Visual fields full.  III, IV, VI: EOMI. Eyelids elevate symmetrically. 11-05-1976.  V: Sensation is intact to light touch and symmetrical to face.  VII: Smile is symmetrical. Able to puff cheeks and raise eyebrows.  VIII: hearing intact to voice. IX, X: Palate elevates symmetrically. Phonation is normal.  11-05-1976 shrug 5/5. XII: tongue is midline without fasciculations. Motor: RUE biceps/triceps 5/5. LUE 4+ grip, 4+ biceps 5/5 triceps. RLE 5/5. LLE 4+/5 plantar flexion. Tone: is normal  and bulk is normal. No pronator drift.  Sensation- Intact to light touch bilaterally. No extinction to DSS. Cold temperature symmetrical to face and BUE/BLE.  Coordination: FTN intact bilaterally, HKS intact. No ataxia. No drift BUE/BLE.  DTRs: 2+ throughout Gait- Normal gait and station.   Medications  Current Facility-Administered Medications:  .   stroke: mapping our early stages of recovery book, , Does not apply, Once, Norins, Rosalyn Gess, MD .  acetaminophen (TYLENOL) tablet  650 mg, 650 mg, Oral, Q4H PRN, 650 mg at 10/02/20 0153 **OR** acetaminophen (TYLENOL) 160 MG/5ML solution 650 mg, 650 mg, Per Tube, Q4H PRN **OR** acetaminophen (TYLENOL) suppository 650 mg, 650 mg, Rectal, Q4H PRN, Norins, Rosalyn Gess, MD .  aspirin chewable tablet 81 mg, 81 mg, Oral, Daily, Norins, Rosalyn Gess, MD, 81 mg at 10/02/20 0908 .  atorvastatin (LIPITOR) tablet 40 mg, 40 mg, Oral, Daily, Kirby-Graham, Beather Arbour, NP, 40 mg at 10/02/20 1202 .  [START ON 10/03/2020] clopidogrel (PLAVIX) tablet 75 mg, 75 mg, Oral, Daily, Noralee Stain, DO .  enoxaparin (LOVENOX) injection 40 mg, 40 mg, Subcutaneous, Q24H, Norins, Rosalyn Gess, MD, 40 mg at 10/02/20 0910 .  hydrALAZINE (APRESOLINE) injection 5 mg, 5 mg, Intravenous, Q6H PRN, Norins, Rosalyn Gess, MD, 5 mg at 10/02/20 0150 .  hydrochlorothiazide (MICROZIDE) capsule 12.5 mg, 12.5 mg, Oral, Daily, Norins, Rosalyn Gess, MD, 12.5 mg at 10/02/20 0908 .  losartan (COZAAR) tablet 25 mg, 25 mg, Oral, Daily, Norins, Rosalyn Gess, MD, 25 mg at 10/02/20 0909 .  [START ON 10/03/2020] potassium chloride (KLOR-CON) CR tablet 10 mEq, 10 mEq, Oral, Daily, Norins, Rosalyn Gess, MD No current outpatient medications on file.  Pertinent Labs: As per HPI   Imaging MD has reviewed images in epic and the results pertinent to this consultation are:  CT Head .  1.Stable non contrast CT appearance of the brain since 2145 hours last night. Suspect age advanced small vessel disease. 2. ASPECTS 10.  MRI Brain  There is an acute right lateral lenticulostriate territory infarct extending from the posterior aspect of the right lentiform nucleus to the caudate body. This is separate from a chronic lacunar infarct which is located more anteriorly in the right basal ganglia. Scattered small foci of T2 hyperintensity in the cerebral white matter bilaterally are moderately advanced for age and nonspecific but compatible with chronic small vessel ischemic disease. No intracranial hemorrhage,  mass, midline shift, or extra-axial fluid collection is identified. The ventricles and sulci are normal. No abnormal enhancement is identified.  MRA head/neck Negative head MRA. Negative neck MRA.  CTA head and neck  1. Technically limited exam due to a technical error with the scanner during the time of scanning. The upper and mid aspects of the brain are clipped on the CTA portion of this study, with incomplete visualization of the distal arterial vasculature. 2. Negative CTA of the head and neck allowing for technical limitations as above. No large vessel occlusion, hemodynamically significant stenosis, or other acute vascular abnormality.  Assessment: 35 yo male who presented to the [facility] ED last PM with symptoms of facial droop, left sided weakness, and slurred speech. Symptoms resolved but returned around 0450. On exam, he does have some minor weakness on the left with grip and plantar flexion. He states he has no personal history of stroke, but old lacunar infarct was found on CTH. Acute stroke likely secondary to uncontrolled BP. He does smoke cigars and inhales the tobacco smoke.   Recommendations: -Hemoglobin A1c and TSH  ordered -Control stroke risk factors. Discussed dietary changes, smoking cessation and light daily aerobic exercise regimen as well as starting a BP diary -Permissive HTN as high as 220/110 x 24 hours, then normalize.  -Follow NIHSS, which is 0 on exam today - NP and Neurology Attending both educated patient on importance of BP control and statin to help prevent another stroke.  - Will need a hypercoagulable panel drawn.  - Cardiac telemetry  Pt seen on initial Neurology evaluation by Jimmye Norman, MSN, APN-BC/Nurse Practitioner.  Pager: [phone]  I have seen and examined the patient. I have formulated the assessment and recommendations. 35 year old male presenting with right basal ganglia stroke. There is evidence for an old subclinical stroke on  imaging, as well as chronic small vessel ischemic changes. At the time of attending evaluation, his strength is back to baseline at 5/5 x 4. Recommendations as above.  Electronically signed: Dr. Caryl Pina

## 2020-10-02 NOTE — ED Notes (Signed)
Patient transported to CT 

## 2020-10-02 NOTE — Consult Note (Addendum)
[facility] Tele[facility]urology Consult Services   Date of Service:   10/02/2020 06:05:34  Diagnosis:     .  G45.9 - Transient cerebral ischemic attack, unspecified  Impression: 35 yr old male with significant past medical history of HTN, with recurrent episodes of left arm weakness left facial droop and slurred speech . A CT head was non acute but showed old right lacunar infarct. his sxs resolved completely x2 . LKW 04:40 while he was in the ED. discussed utilization of iv altpelase however his sxs resolved and he agreed no tx. etiology include recurrent TIAs, hypertensive urgency, recrudescence of old right lacunar infarct, extension of his old stroke , also consider focal seizure. advise MRI brain head w/o and MRA head and neck, start ASA and Plavix DAPT, pending his course would need stroke work-up in young including hypercoagulable labs and TEE. Neurology follow-up. D/w ED physician  Metrics: Last Known Well: 10/02/2020 04:40:00 [facility] Notification Time: 10/02/2020 06:05:33 Stamp Time: 10/02/2020 06:05:34 Initial Response Time: 10/02/2020 06:09:23 Symptoms: left facial droop slurred speech. NIHSS Start Assessment Time: 10/02/2020 02:72:53 Patient is not a candidate for Thrombolytic. Thrombolytic Medical Decision: 10/02/2020 06:26:36 Patient was not deemed candidate for Thrombolytic because of following reasons: Resolved symptoms (no residual disabling symptoms).  CT head showed no acute hemorrhage or acute core infarct.  Process delays noted by physician:  . Activation Delay:     Not Applicable  . Thrombolytic Decision Delay:     Additional information became available which did not exist upon initial assessment  Advanced Imaging: Advanced Imaging Not Recommended because:  Clinical Presentation is not Suggestive of LVO and NIHSS is <6   Our recommendations are outlined below.  Recommendations:      .  Stroke/Telemetry Floor     .  Neuro  Checks     .  Bedside Swallow Eval     .  DVT Prophylaxis     .  IV Fluids, Normal Saline     .  Head of Bed 30 Degrees     .  Euglycemia and Avoid Hyperthermia (PRN Acetaminophen)     .  Bolus with Clopidogrel 300 mg bolus x1 and initiate dual antiplatelet therapy with Aspirin 81 mg daily and Clopidogrel 75 mg daily     ------------------------------------------------------------------------------  History of Present Illness: Patient is a 35 year old Male.  Inpatient stroke alert was called for symptoms of left facial droop slurred speech.  35 yr old male with significant past medical history of HTN, not on any medications, came to the ED last night for episode of left arm weakness left facial droop and slurred speech . A CT head was non acute but showed old right lacunar infarct. his ss resolved completely . while waiting to go for his MRI he had another similar event. LKW 04:40. he had been seeing and nurse came in. his BP have ben in the 180s.  Last seen normal was within 4.5 hours. There is no history of hemorrhagic complications or intracranial hemorrhage. There is no history of Recent Anticoagulants. There is no history of recent major surgery.  Past Medical History:     . Hypertension     . There is NO history of Diabetes Mellitus     . There is NO history of Hyperlipidemia     . There is NO history of Atrial Fibrillation     . There is NO history of Coronary Artery Disease     . There is NO history of Stroke     .  There is NO history of Covid-19  Social History: Smoking: No Alcohol Use: No Drug Use: Yes  Review of System:  14 Points Review of Systems was performed and was negative except mentioned in HPI.  Anticoagulant use:  No  Antiplatelet use: No  Allergies:  Reviewed    Examination: BP(sbp 180s), Pulse(pend), Blood Glucose(pend) 1A: Level of Consciousness - Alert; keenly responsive + 0 1B: Ask Month and Age - Both Questions Right + 0 1C: Blink Eyes  & Squeeze Hands - Performs Both Tasks + 0 2: Test Horizontal Extraocular Movements - Normal + 0 3: Test Visual Fields - No Visual Loss + 0 4: Test Facial Palsy (Use Grimace if Obtunded) - Minor paralysis (flat nasolabial fold, smile asymmetry) + 1 5A: Test Left Arm Motor Drift - Drift, but doesn't hit bed + 1 5B: Test Right Arm Motor Drift - No Drift for 10 Seconds + 0 6A: Test Left Leg Motor Drift - No Drift for 5 Seconds + 0 6B: Test Right Leg Motor Drift - No Drift for 5 Seconds + 0 7: Test Limb Ataxia (FNF/Heel-Shin) - No Ataxia + 0 8: Test Sensation - Normal; No sensory loss + 0 9: Test Language/Aphasia - Normal; No aphasia + 0 10: Test Dysarthria - Normal + 0 11: Test Extinction/Inattention - No abnormality + 0  NIHSS Score: 2  Pre-Morbid Modified Rankin Scale: 0 Points = No symptoms at all   Patient/Family was informed the Neurology Consult would occur via TeleHealth consult by way of interactive audio and video telecommunications and consented to receiving care in this manner.   Patient is being evaluated for possible acute neurologic impairment and high probability of imminent or life-threatening deterioration. I spent total of 33 minutes providing care to this patient, including time for face to face visit via telemedicine, review of medical records, imaging studies and discussion of findings with providers, the patient and/or family.   Dr Sampson Goon   [facility] [phone]  Case [id number]

## 2020-10-02 NOTE — ED Provider Notes (Signed)
Asked to see patient by nursing staff.  Patient is admitted to the hospitalist service and is awaiting bed placement, being held in the emergency department.  Nursing staff reports that the last time she saw the patient was 1 and half hours ago he was at his normal baseline.  He took a nap and then just woke up and was experiencing slurred speech and left-sided facial droop.  His are similar symptoms to what brought him into the emergency department earlier.  Physical Exam  BP (!) 160/106   Pulse 98   Temp 98.5 F (36.9 C) (Oral)   Resp 19   SpO2 98%   Physical Exam HENT:     Head: Normocephalic.  Cardiovascular:     Rate and Rhythm: Normal rate and regular rhythm.  Pulmonary:     Effort: Pulmonary effort is normal.     Breath sounds: Normal breath sounds.  Musculoskeletal:        General: Normal range of motion.  Neurological:     Mental Status: He is alert.     GCS: GCS eye subscore is 4. GCS verbal subscore is 5. GCS motor subscore is 6.     Cranial Nerves: Cranial nerve deficit and facial asymmetry present.     ED Course/Procedures     Procedures  MDM  Patient with recurrent left facial droop.  Patient has been asleep for 1 and half hours, last known normal was 1-1/2 hours ago.  Code Stroke was activated.  Repeat head CT is unchanged from previous.  Teleneurology has discussed the possibility of TPA with the patient but his symptoms are improving although not completely resolved.  Ultimately it was decided to not give TPA in a shared decision between the patient and neurology.  We will add MRA to the already scheduled MRI at the recommendation of neurology.  CRITICAL CARE Performed by: Gilda Crease   Total critical care time: 30 minutes  Critical care time was exclusive of separately billable procedures and treating other patients.  Critical care was necessary to treat or prevent imminent or life-threatening deterioration.  Critical care was time spent  personally by me on the following activities: development of treatment plan with patient and/or surrogate as well as nursing, discussions with consultants, evaluation of patient's response to treatment, examination of patient, obtaining history from patient or surrogate, ordering and performing treatments and interventions, ordering and review of laboratory studies, ordering and review of radiographic studies, pulse oximetry and re-evaluation of patient's condition.       Gilda Crease, MD 10/02/20 [phone]

## 2020-10-02 NOTE — Progress Notes (Signed)
  Echocardiogram 2D Echocardiogram has been performed.  Stark Bray Swaim 10/02/2020, 10:18 AM

## 2020-10-02 NOTE — ED Notes (Signed)
Patient transported to MRI 

## 2020-10-02 NOTE — H&P (Signed)
History and Physical    Britt Borst AQT:[id number] DOB: [birth year] DOA: 10/01/2020  PCP: Patient, No Pcp Per (Confirm with patient/family/NH records and if not entered, this has to be entered at [facility] point of entry) Patient coming from: home  I have personally briefly reviewed patient's old medical records in [facility]  Chief Complaint: left sided weakness  HPI: William Cantu is a 35 y.o. male with no significant medical history who after a post-work nap awoke with left facial droop, left UE and LE weakness and slightly slurred speech. Of note - in reviewing past records or injury visits to ED as far back as 2019 he was hypertensive with DBP > 110, SBP > 150. Due to his symptoms he presents to [facility]   ED Course: T98.5  179/126  HR 73  R 22. EDP exam, within one hour of arrival was non-focal although triage RN eval was positive for left sided weakness and facial droop. Cmet remarkable for Cr 1.22. 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 [facility] called to admit for TIA workup and management of hypertension.  Review of Systems: As per HPI otherwise 10 point review of systems negative.    History reviewed. No pertinent past medical history.  History reviewed. No pertinent surgical history.   reports that he has been smoking. He has never used smokeless tobacco. He reports that he does not drink alcohol and does not use drugs.  No Known Allergies  Family History  Problem Relation Age of Onset  . Hypertension Father      Prior to Admission medications   Not on File    Physical Exam: Vitals:   10/02/20 0000 10/02/20 0015 10/02/20 0045 10/02/20 0136  BP: (!) 164/117 (!) 175/128 (!) 179/126 (!) 183/127  Pulse: 77 68 73 75  Resp: (!) 26 (!) 26 (!) 22 (!) 23  Temp:      TempSrc:      SpO2: 97% 97% 98% 98%     Vitals:   10/02/20 0000 10/02/20 0015 10/02/20 0045 10/02/20 0136  BP: (!) 164/117 (!) 175/128 (!) 179/126 (!)  183/127  Pulse: 77 68 73 75  Resp: (!) 26 (!) 26 (!) 22 (!) 23  Temp:      TempSrc:      SpO2: 97% 97% 98% 98%   General: WNWD atheletic appearing man in no distress Eyes: PERRL, lids and conjunctivae normal. Fundoscopic exam revealed copper wiring and AV nicking. No hemorrhage noted ENMT: Mucous membranes are moist. Posterior pharynx clear of any exudate or lesions.Normal dentition.  Neck: normal, supple, no masses, no thyromegaly Respiratory: clear to auscultation bilaterally, no wheezing, no crackles. Normal respiratory effort. No accessory muscle use.  Cardiovascular: Regular rate and rhythm, no murmurs / rubs / gallops. No extremity edema. 2+ pedal pulses. No carotid bruits.  Abdomen: no tenderness, no masses palpated. No hepatosplenomegaly. Bowel sounds positive.  Musculoskeletal: no clubbing / cyanosis. No joint deformity upper and lower extremities. Good ROM, no contractures. Normal muscle tone.  Skin: no rashes, lesions, ulcers. No induration Neurologic: CN 2-12 grossly intact. Sensation intact, DTR normal. Strength 5/5 in all 4.  Psychiatric: Normal judgment and insight. Alert and oriented x 3. Normal mood.     Labs on Admission: I have personally reviewed following labs and imaging studies  CBC: Recent Labs  Lab 10/01/20 2120 10/01/20 2131  WBC 9.6  --   NEUTROABS 4.6  --   HGB 15.0 16.3  HCT 44.8  48.0  MCV 83.9  --   PLT 203  --    Basic Metabolic Panel: Recent Labs  Lab 10/01/20 2120 10/01/20 2131  NA 142 142  K 3.4* 3.4*  CL 106 104  CO2 26  --   GLUCOSE 81 78  BUN 11 11  CREATININE 1.22 1.20  CALCIUM 9.4  --    GFR: CrCl cannot be calculated (Unknown ideal weight.). Liver Function Tests: Recent Labs  Lab 10/01/20 2120  AST 20  ALT 15  ALKPHOS 77  BILITOT 1.0  PROT 7.7  ALBUMIN 4.3   No results for input(s): LIPASE, AMYLASE in the last 168 hours. No results for input(s): AMMONIA in the last 168 hours. Coagulation Profile: Recent Labs   Lab 10/01/20 2120  INR 1.1   Cardiac Enzymes: No results for input(s): CKTOTAL, CKMB, CKMBINDEX, TROPONINI in the last 168 hours. BNP (last 3 results) No results for input(s): PROBNP in the last 8760 hours. HbA1C: No results for input(s): HGBA1C in the last 72 hours. CBG: No results for input(s): GLUCAP in the last 168 hours. Lipid Profile: No results for input(s): CHOL, HDL, LDLCALC, TRIG, CHOLHDL, LDLDIRECT in the last 72 hours. Thyroid Function Tests: No results for input(s): TSH, T4TOTAL, FREET4, T3FREE, THYROIDAB in the last 72 hours. Anemia Panel: No results for input(s): VITAMINB12, FOLATE, FERRITIN, TIBC, IRON, RETICCTPCT in the last 72 hours. Urine analysis:    Component Value Date/Time   COLORURINE YELLOW 10/01/2020 2120   APPEARANCEUR CLEAR 10/01/2020 2120   LABSPEC 1.031 (H) 10/01/2020 2120   PHURINE 6.0 10/01/2020 2120   GLUCOSEU NEGATIVE 10/01/2020 2120   HGBUR SMALL (A) 10/01/2020 2120   BILIRUBINUR NEGATIVE 10/01/2020 2120   KETONESUR NEGATIVE 10/01/2020 2120   PROTEINUR NEGATIVE 10/01/2020 2120   NITRITE NEGATIVE 10/01/2020 2120   LEUKOCYTESUR NEGATIVE 10/01/2020 2120    Radiological Exams on Admission: CT Angio Head W or Wo Contrast  Result Date: 10/01/2020 CLINICAL DATA:  Initial evaluation for acute slurred speech. Possible stroke. EXAM: CT ANGIOGRAPHY HEAD AND NECK TECHNIQUE: Multidetector CT imaging of the head and neck was performed using the standard protocol during bolus administration of intravenous contrast. Multiplanar CT image reconstructions and MIPs were obtained to evaluate the vascular anatomy. Carotid stenosis measurements (when applicable) are obtained utilizing NASCET criteria, using the distal internal carotid diameter as the denominator. CONTRAST:  29mL OMNIPAQUE IOHEXOL 350 MG/ML SOLN COMPARISON:  None. FINDINGS: CT HEAD FINDINGS Brain: Cerebral volume within normal limits for patient age. Chronic appearing lacunar infarct present at the  anterior limb of the right internal capsule. Few scattered patchy hypodensities noted involving the supratentorial cerebral white matter, nonspecific, but favored to reflect age advanced chronic microvascular ischemic disease. No evidence for acute intracranial hemorrhage. No findings to suggest acute large vessel territory infarct. No mass lesion, midline shift, or mass effect. Ventricles are normal in size without evidence for hydrocephalus. No extra-axial fluid collection identified. Vascular: No hyperdense vessel identified. Skull: Scalp soft tissues demonstrate no acute abnormality. Calvarium intact. Sinuses/Orbits: Globes and orbital soft tissues within normal limits. Scattered mucosal thickening noted within the ethmoidal air cells. Visualized paranasal sinuses are otherwise largely clear. No mastoid effusion. CTA NECK FINDINGS Aortic arch: Visualized aortic arch of normal caliber with normal branch pattern. No hemodynamically significant stenosis about the origin of the great vessels. Right carotid system: Right common and internal carotid arteries widely patent without stenosis, dissection or occlusion. Left carotid system: Left common and internal carotid arteries widely patent without stenosis, dissection or  occlusion. Proximal left ICA partially medialized into the retropharyngeal space. Vertebral arteries: Both vertebral arteries arise from the subclavian arteries. No proximal subclavian artery stenosis. Left vertebral artery slightly dominant. Proximal right vertebral artery not well evaluated due to adjacent venous contamination. Visualized portions of the vertebral arteries are widely patent without stenosis dissection or occlusion. Skeleton: No visible acute osseous finding. No discrete or worrisome osseous lesions. Other neck: No other acute soft tissue abnormality within the neck. No mass or adenopathy. Upper chest: Visualized upper chest demonstrates no acute finding. Review of the MIP images  confirms the above findings CTA HEAD FINDINGS Anterior circulation: Evaluation of the intracranial circulation technically limited due to a technical error with the scanner during the time of scanning. The upper and mid aspects of the brain are clipped on the CTA portion of this study, with incomplete visualization of the distal arterial vasculature. Both internal carotid arteries are widely patent to the termini without stenosis or other abnormality. A1 segments patent bilaterally. Normal anterior communicating artery complex. Visualized ACAs widely patent. No M1 stenosis or occlusion. Normal MCA bifurcations. Visualized distal MCA branches well perfused and widely patent bilaterally. Posterior circulation: Both V4 segments widely patent to the vertebrobasilar junction. Both PICA origins patent and normal. Basilar widely patent to its distal aspect without stenosis. Superior cerebellar arteries patent bilaterally. Left PCA supplied via the basilar as well as a robust left posterior communicating artery. Fetal type origin of the right PCA. Visualized PCAs well perfused without stenosis or other abnormality. Venous sinuses: Visualized major dural sinuses are patent. Anatomic variants: Fetal type origin of the right PCA. No visible aneurysm. Review of the MIP images confirms the above findings IMPRESSION: CT HEAD IMPRESSION: 1. No acute intracranial abnormality. 2. Chronic lacunar infarct at the anterior limb of the right internal capsule. 3. Patchy hypodensity involving the supratentorial cerebral white matter, nonspecific, but favored to reflect age advanced chronic microvascular ischemic disease. Sequelae of demyelinating disease would be the primary differential consideration. Finding could be further assessed with dedicated MRI of the brain as clinically warranted. CTA HEAD AND NECK IMPRESSION: 1. Technically limited exam due to a technical error with the scanner during the time of scanning. The upper and mid  aspects of the brain are clipped on the CTA portion of this study, with incomplete visualization of the distal arterial vasculature. 2. Negative CTA of the head and neck allowing for technical limitations as above. No large vessel occlusion, hemodynamically significant stenosis, or other acute vascular abnormality. Electronically Signed   By: Rise Mu M.D.   On: 10/01/2020 22:44   CT Angio Neck W and/or Wo Contrast  Result Date: 10/01/2020 CLINICAL DATA:  Initial evaluation for acute slurred speech. Possible stroke. EXAM: CT ANGIOGRAPHY HEAD AND NECK TECHNIQUE: Multidetector CT imaging of the head and neck was performed using the standard protocol during bolus administration of intravenous contrast. Multiplanar CT image reconstructions and MIPs were obtained to evaluate the vascular anatomy. Carotid stenosis measurements (when applicable) are obtained utilizing NASCET criteria, using the distal internal carotid diameter as the denominator. CONTRAST:  48mL OMNIPAQUE IOHEXOL 350 MG/ML SOLN COMPARISON:  None. FINDINGS: CT HEAD FINDINGS Brain: Cerebral volume within normal limits for patient age. Chronic appearing lacunar infarct present at the anterior limb of the right internal capsule. Few scattered patchy hypodensities noted involving the supratentorial cerebral white matter, nonspecific, but favored to reflect age advanced chronic microvascular ischemic disease. No evidence for acute intracranial hemorrhage. No findings to suggest acute large vessel territory  infarct. No mass lesion, midline shift, or mass effect. Ventricles are normal in size without evidence for hydrocephalus. No extra-axial fluid collection identified. Vascular: No hyperdense vessel identified. Skull: Scalp soft tissues demonstrate no acute abnormality. Calvarium intact. Sinuses/Orbits: Globes and orbital soft tissues within normal limits. Scattered mucosal thickening noted within the ethmoidal air cells. Visualized paranasal  sinuses are otherwise largely clear. No mastoid effusion. CTA NECK FINDINGS Aortic arch: Visualized aortic arch of normal caliber with normal branch pattern. No hemodynamically significant stenosis about the origin of the great vessels. Right carotid system: Right common and internal carotid arteries widely patent without stenosis, dissection or occlusion. Left carotid system: Left common and internal carotid arteries widely patent without stenosis, dissection or occlusion. Proximal left ICA partially medialized into the retropharyngeal space. Vertebral arteries: Both vertebral arteries arise from the subclavian arteries. No proximal subclavian artery stenosis. Left vertebral artery slightly dominant. Proximal right vertebral artery not well evaluated due to adjacent venous contamination. Visualized portions of the vertebral arteries are widely patent without stenosis dissection or occlusion. Skeleton: No visible acute osseous finding. No discrete or worrisome osseous lesions. Other neck: No other acute soft tissue abnormality within the neck. No mass or adenopathy. Upper chest: Visualized upper chest demonstrates no acute finding. Review of the MIP images confirms the above findings CTA HEAD FINDINGS Anterior circulation: Evaluation of the intracranial circulation technically limited due to a technical error with the scanner during the time of scanning. The upper and mid aspects of the brain are clipped on the CTA portion of this study, with incomplete visualization of the distal arterial vasculature. Both internal carotid arteries are widely patent to the termini without stenosis or other abnormality. A1 segments patent bilaterally. Normal anterior communicating artery complex. Visualized ACAs widely patent. No M1 stenosis or occlusion. Normal MCA bifurcations. Visualized distal MCA branches well perfused and widely patent bilaterally. Posterior circulation: Both V4 segments widely patent to the vertebrobasilar  junction. Both PICA origins patent and normal. Basilar widely patent to its distal aspect without stenosis. Superior cerebellar arteries patent bilaterally. Left PCA supplied via the basilar as well as a robust left posterior communicating artery. Fetal type origin of the right PCA. Visualized PCAs well perfused without stenosis or other abnormality. Venous sinuses: Visualized major dural sinuses are patent. Anatomic variants: Fetal type origin of the right PCA. No visible aneurysm. Review of the MIP images confirms the above findings IMPRESSION: CT HEAD IMPRESSION: 1. No acute intracranial abnormality. 2. Chronic lacunar infarct at the anterior limb of the right internal capsule. 3. Patchy hypodensity involving the supratentorial cerebral white matter, nonspecific, but favored to reflect age advanced chronic microvascular ischemic disease. Sequelae of demyelinating disease would be the primary differential consideration. Finding could be further assessed with dedicated MRI of the brain as clinically warranted. CTA HEAD AND NECK IMPRESSION: 1. Technically limited exam due to a technical error with the scanner during the time of scanning. The upper and mid aspects of the brain are clipped on the CTA portion of this study, with incomplete visualization of the distal arterial vasculature. 2. Negative CTA of the head and neck allowing for technical limitations as above. No large vessel occlusion, hemodynamically significant stenosis, or other acute vascular abnormality. Electronically Signed   By: Rise Mu M.D.   On: 10/01/2020 22:44    EKG: Independently reviewed. Sinus rhythm  Assessment/Plan Active Problems:   Accelerated hypertension   TIA (transient ischemic attack)    1. TIA - patient with left sided weakness, facial  droop and slurred speech which cleared within 2 hours. CTA reveals old infarct and microvascular disease.  Plan Med-surg obs admit  MRI brain  ECHO  Carotid  dopplers  ASA  No indication for PT/OT eval  2. Accelerated hypertension - a long standing problem not diagnosed although noted to have elevated BP reading in records going back to 2019. Family hx of HTN. TIA most likely related to uncontrolled HTN. Mild renal insufficiency possibly related to HTN. Plan ARB - losartan, has renal protective effect as well as BP mgt  Diuretic - low dose  K replacement  Hydralazine for emergent treatment  3. Follow up/dispostion - uninsured. Will need medical follow up and financial assistance. Plan   TOC consult  DVT prophylaxis: lovenox  (Lovenox/Heparin/SCD's/anticoagulated/None (if comfort care) Code Status: full code (Full/Partial (specify details) Family Communication: father aware of Dx znd Tx  Disposition Plan: home 24-48 hrs (specify when and where you expect patient to be discharged) Consults called: none (with names) Admission status: obs (inpatient / obs / tele / medical floor / SDU)   Illene RegulusMichael Tanequa Kretz MD [facility] Pager [phone]  If 7PM-7AM, please contact night-coverage [email or web address] Password [email or web address]  10/02/2020, 1:44 AM

## 2020-10-02 NOTE — ED Notes (Signed)
Report given to [facility] RN 

## 2020-10-02 NOTE — Progress Notes (Signed)
  PROGRESS NOTE  Patient admitted earlier this morning. See H&P.   William Cantu is a 35 year old male without previous significant medical history who presented after waking up yesterday afternoon with left facial droop, left upper and lower extremity weakness with slightly slurred speech.  He states that his episode lasted about 30 minutes then resolved completely.  He had another episode in the hospital in the emergency department which also lasted about 30 minutes and resolved.  Neurology was consulted.  Patient underwent MRI brain which revealed acute right basal ganglia infarct.  On examination in the emergency department, patient has elevated blood pressure with BP 151/118.  He has no other physical complaints.  Neurologically, his speech is clear, cranial nerves grossly intact, strength 5 out of 5 in all extremities.  Aspirin and Plavix per telemetry neurology consultation Discussed with Dr. Otelia Limes Neurologist. Recommended transfer to [facility] for stroke team evaluation.  Hypercoagulable work up ordered.     Status is: Observation  The patient will require care spanning > 2 midnights and should be moved to inpatient because: Ongoing diagnostic testing needed not appropriate for outpatient work up  Dispo: The patient is from: Home              Anticipated d/c is to: Home              Anticipated d/c date is: 1 day              Patient currently is not medically stable to d/c. Further neuro work up pending.    Difficult to place patient No       Noralee Stain, DO [facility] 10/02/2020, 10:00 AM  Available via Epic secure chat 7am-7pm After these hours, please refer to coverage provider listed on [email or web address]

## 2020-10-03 ENCOUNTER — Inpatient Hospital Stay (HOSPITAL_COMMUNITY): Payer: Self-pay

## 2020-10-03 DIAGNOSIS — I161 Hypertensive emergency: Secondary | ICD-10-CM

## 2020-10-03 DIAGNOSIS — I639 Cerebral infarction, unspecified: Secondary | ICD-10-CM

## 2020-10-03 DIAGNOSIS — F17200 Nicotine dependence, unspecified, uncomplicated: Secondary | ICD-10-CM

## 2020-10-03 DIAGNOSIS — I63311 Cerebral infarction due to thrombosis of right middle cerebral artery: Secondary | ICD-10-CM

## 2020-10-03 DIAGNOSIS — E7800 Pure hypercholesterolemia, unspecified: Secondary | ICD-10-CM

## 2020-10-03 LAB — MAGNESIUM: Magnesium: 2.2 mg/dL (ref 1.7–2.4)

## 2020-10-03 LAB — PROTEIN S ACTIVITY: Protein S Activity: 103 % (ref 63–140)

## 2020-10-03 LAB — BASIC METABOLIC PANEL
Anion gap: 9 (ref 5–15)
BUN: 10 mg/dL (ref 6–20)
CO2: 24 mmol/L (ref 22–32)
Calcium: 8.6 mg/dL — ABNORMAL LOW (ref 8.9–10.3)
Chloride: 105 mmol/L (ref 98–111)
Creatinine, Ser: 1.29 mg/dL — ABNORMAL HIGH (ref 0.61–1.24)
GFR, Estimated: >60 mL/min (ref >60)
Glucose, Bld: 101 mg/dL — ABNORMAL HIGH (ref 70–99)
Potassium: 3.3 mmol/L — ABNORMAL LOW (ref 3.5–5.1)
Sodium: 138 mmol/L (ref 135–145)

## 2020-10-03 LAB — LUPUS ANTICOAGULANT PANEL
DRVVT: 40.8 s (ref 0.0–47.0)
PTT Lupus Anticoagulant: 49.1 s (ref 0.0–51.9)

## 2020-10-03 LAB — PROTEIN C ACTIVITY: Protein C Activity: 114 % (ref 73–180)

## 2020-10-03 LAB — HOMOCYSTEINE: Homocysteine: 13 umol/L (ref 0.0–14.5)

## 2020-10-03 LAB — VITAMIN B12: Vitamin B-12: 418 pg/mL (ref 180–914)

## 2020-10-03 LAB — PROTEIN S, TOTAL: Protein S Ag, Total: 89 % (ref 60–150)

## 2020-10-03 MED ORDER — POTASSIUM CHLORIDE CRYS ER 20 MEQ PO TBCR
40.0000 meq | EXTENDED_RELEASE_TABLET | ORAL | Status: AC
  Administered 2020-10-03 (×2): 40 meq via ORAL
  Filled 2020-10-03 (×2): qty 2

## 2020-10-03 NOTE — Progress Notes (Signed)
    [facility] has been requested to perform a transesophageal echocardiogram on William Cantu for stroke.  After careful review of history and examination, the risks and benefits of transesophageal echocardiogram have been explained including risks of esophageal damage, perforation (1:10,000 risk), bleeding, pharyngeal hematoma as well as other potential complications associated with conscious sedation including aspiration, arrhythmia, respiratory failure and death. Alternatives to treatment were discussed, questions were answered. Patient is willing to proceed.   He is scheduled tomorrow at 1:30pm with Dr. Royann Shivers. NPO at [place].  Marcelino Duster, [place]  10/03/2020 4:35 PM

## 2020-10-03 NOTE — Progress Notes (Signed)
Lower extremity venous bilateral study completed.   Please see CV Proc for preliminary results.   Eugune Sine, RDMS  

## 2020-10-03 NOTE — H&P (View-Only) (Signed)
STROKE TEAM PROGRESS NOTE   INTERVAL HISTORY Pt is examined at the bedside.  No family present at bedside.  Patient denies any complaints.  States he had 2 episodes of left-sided weakness , slurring speech and facial droop lasting 15 to 20 minutes.  He states his symptoms completely resolved.  Patient denies any weakness, numbness, slurring speech, facial droop right now. No h/o stroke or TIA in the past. .  No history of seizures.  Advised to stop smoking.  On neurological examination today, patient alert, oriented x4, normal strength in all 4 extremities, no focal deficits present on exam.  Patient blood pressure elevated at 160/ 124 mmHg, afebrile.  CT head shows chronic lacunar infarct at the anterior limb of the right internal capsule, CTA head and neck normal, MRI brain showed acute right basal ganglia infarct, negative MRA head and neck.  ECHO showed LVEF 55 to 60%.  Patient was started on aspirin 81 mg, Lipitor 40 mg daily, Plavix 75 mg daily. TSH 1.535, HbA1c 5.6,  K 3.3, creatinine 1.29.  He is on losartan 25 mg daily for blood pressure control.  Vascular ultrasound of lower extremities scheduled for today. Hypercoagulable work-up pending. RPR and Vitamin B12 ordered.  TEE pending, scheduled for tomorrow.  Vitals:   10/03/20 0013 10/03/20 0416 10/03/20 0728 10/03/20 1132  BP: (!) 153/106 (!) 131/98 (!) 147/101 (!) 160/124  Pulse: 70 79 87 75  Resp: 18 18 18 16   Temp: 98.4 F (36.9 C) 98.2 F (36.8 C) 98.2 F (36.8 C) 98 F (36.7 C)  TempSrc: Oral Oral Oral   SpO2: 100% 99% 98% 100%   CBC:  Recent Labs  Lab 10/01/20 2120 10/01/20 2131  WBC 9.6  --   NEUTROABS 4.6  --   HGB 15.0 16.3  HCT 44.8 48.0  MCV 83.9  --   PLT 203  --    Basic Metabolic Panel:  Recent Labs  Lab 10/02/20 0310 10/03/20 0318  NA 138 138  K 3.2* 3.3*  CL 105 105  CO2 24 24  GLUCOSE 119* 101*  BUN 9 10  CREATININE 1.11 1.29*  CALCIUM 8.8* 8.6*  MG  --  2.2   Lipid Panel:  Recent Labs  Lab  10/02/20 0310  CHOL 159  TRIG 98  HDL 35*  CHOLHDL 4.5  VLDL 20  LDLCALC 11/30/20*  HgbA1c:  Recent Labs  Lab 10/02/20 0310  HGBA1C 5.6   Urine Drug Screen:  Recent Labs  Lab 10/01/20 2120  LABOPIA NONE DETECTED  COCAINSCRNUR NONE DETECTED  LABBENZ NONE DETECTED  AMPHETMU NONE DETECTED  THCU POSITIVE*  LABBARB NONE DETECTED    Alcohol Level  Recent Labs  Lab 10/01/20 2120  ETH <10    IMAGING past 24 hours No results found.  PHYSICAL EXAM GENERAL: Awake, alert in NAD HEENT: - Normocephalic and atraumatic LUNGS - Symmetrical chest rise, No labored breathing noted CV - no JVD, No Peripheral Edema ABDOMEN - Soft,  nondistended  Ext: warm, well perfused, intact peripheral pulses, no Peripheral edema  NEURO Exam:   Mental Status: AA&Ox4, Oriented to self, age, place, situation, Good Attention and Concentration Language: speech is fluent. Fuency, and comprehension intact. Cranial Nerves:   CN II Pupils equal and reactive to light, no VF deficits    CN III,IV,VI EOM intact, no gaze preference or deviation, no nystagmus    CN V normal sensation in V1, V2, and V3 segments bilaterally    CN VII no asymmetry, no nasolabial  fold flattening    CN VIII normal hearing to speech    CN IX & X normal palatal elevation, no uvular deviation    CN XI 5/5 head turn and 5/5 shoulder shrug bilaterally    CN XII midline tongue protrusion    Motor: No Drift in Upper Extremities, No Drift in LE's. Strength 5/5 in Rt UE, 5/5 in Lt UE, Strength in Rt LE  5/5, Lt LE 5/5 Tone: is normal and bulk is normal Sensation- Intact to light touch bilaterally Coordination: FTN intact bilaterally, no ataxia.  Gait- deferred   ASSESSMENT/PLAN [name] is a 35 y.o. male with no past medical history of presented with slurred speech, left-sided weakness, left-sided facial droop.  His symptoms resolved in 15 minutes before coming to ED. Patient had another similar episode lasting for 15  minutes. Patient blood pressure elevated at 164/117 mmHg, at admission.  CT head shows chronic lacunar infarct at the anterior limb of the right internal capsule, CTA normal, MRI brain showed acute right basal ganglia infarct, negative MRA head and neck.  ECHO showed LVEF 55 to 60%.  HBa1c 5.6, LDL 104, creatinine 1.29. Patient was started on aspirin 81 mg, Lipitor 40 mg daily, Plavix 75 mg daily.  He is on losartan 25 mg daily for blood pressure control.  Vascular ultrasound of lower extremities scheduled. Hypercoagulable work-up pending.  TEE pending, scheduled for tomorrow. RPR and Vitamin B12 ordered.   Stroke - Acute right BG infarct, still more likely small vessel diease due to risk factors of uncontrolled HTN and smoking  CT head- Chronic lacunar infarct at the anterior limb of the right internal capsule. Patchy hypodensity involving the supratentorial cerebral white matter  CTA head & neck -Normal.   MRI  acute right basal ganglia infarct  MRA  Normal  2D Echo - LVEF 55 to 60%.  LE venous Doppler pending  LDL 104  TEE Pending  Hypercoagulable work up  - Pending   RPR - pending  Vitamin B12- Pending  HgbA1c 5.6  VTE prophylaxis - Enoxaparin  No antithrombotic prior to admission, now on aspirin 81 mg daily and clopidogrel 75 mg daily.  Continue DAPT for 3 weeks and then aspirin alone.  Therapy recommendations:  pending  Disposition:  TBD  Hypertension  Home meds:  None  Stable but on the higher end . Permissive hypertension (OK if < 220/120) but gradually normalize in 2-3 days . Long-term BP goal normotensive  Hyperlipidemia  Home meds:  None   LDL 104, goal < 70  Lipitor 40 mg daily started in the hospital.   Continue statin at discharge  Smoking   Pt is everyday smoker.  Pt is advised to stop smoking.   Can offer Nicotine patch.  Other Stroke Risk Factors  Substance abuse - UDS:  THC POSITIVE Patient advised to stop using due to stroke  risk.  Other problems  Hypokalemia- K 3.3, Repleted.   Hospital day # 1   ATTENDING NOTE: I reviewed above note and agree with the assessment and plan. Pt was seen and examined.   35-year-old male with no documented past medical history however now known to have uncontrolled hypertension and smoking presented to ED for TIA x1 and then another TIA while the ED.  Both time patient had slurred speech, left-sided weakness and left facial droop, resolved within 20 minutes.  CT head showed chronic right anterior limb IC small lacunar infarct.  CTA head and neck negative.  MRI showed   right BG/CR infarct.  MRA head and neck negative.  EF 55 to 60%.  LDL 104, A1c 5.6.  UDS showed positive THC, hypercoagulable work-up pending, LE venous Doppler pending.  Given his young age, would recommend TEE to further rule out cardiac source.  Currently patient lying in bed, no family at bedside.  Neurologically intact.  Still has hypertension BP 140s 160s, however will do permissive hypertension for 24 hours and then gradually normalize BP.  Currently on aspirin and Plavix DAPT for 3 weeks and then aspirin alone.  Continue statin.  Smoking cessation education provided.  Stroke risk factor modification.  Will follow.  Marvel Plan, MD PhD Stroke Neurology 10/03/2020 1:39 PM     To contact Stroke Continuity provider, please refer to [email or web address]. After hours, contact General Neurology

## 2020-10-03 NOTE — TOC Initial Note (Signed)
Transition of Care [facility]) - Initial/Assessment Note    Patient Details  Name: Ranger Petrich MRN: [id number] Date of Birth: February 12, 1986  Transition of Care [facility]) CM/SW Contact:    Kermit Balo, RN Phone Number: 10/03/2020, 12:42 PM  Clinical Narrative:                 Patient lives at home with his parents. He denies any transportation issues. Pt has a new job and his insurance has not started yet. He is open to attending one of the [facility]. Appointment placed on AVS. CM also went over the use of the [facility] pharmacy for medication assistance.  TOC following.  Expected Discharge Plan: Home/Self Care Barriers to Discharge: Continued Medical Work up,Inadequate or no insurance   Patient Goals and CMS Choice        Expected Discharge Plan and Services Expected Discharge Plan: Home/Self Care   Discharge Planning Services: CM Consult   Living arrangements for the past 2 months: Single Family Home                                      Prior Living Arrangements/Services Living arrangements for the past 2 months: Single Family Home Lives with:: Parents Patient language and need for interpreter reviewed:: No Do you feel safe going back to the place where you live?: Yes            Criminal Activity/Legal Involvement Pertinent to Current Situation/Hospitalization: Yes - Comment as needed  Activities of Daily Living Home Assistive Devices/Equipment: None ADL Screening (condition at time of admission) Patient's cognitive ability adequate to safely complete daily activities?: Yes Is the patient deaf or have difficulty hearing?: No Does the patient have difficulty seeing, even when wearing glasses/contacts?: No Does the patient have difficulty concentrating, remembering, or making decisions?: No Patient able to express need for assistance with ADLs?: Yes Does the patient have difficulty dressing or bathing?: No Independently performs ADLs?: Yes (appropriate for  developmental age) Does the patient have difficulty walking or climbing stairs?: No Weakness of Legs: None Weakness of Arms/Hands: None  Permission Sought/Granted                  Emotional Assessment Appearance:: Appears stated age Attitude/Demeanor/Rapport: Engaged Affect (typically observed): Accepting Orientation: : Oriented to Self,Oriented to Place,Oriented to  Time,Oriented to Situation   Psych Involvement: No (comment)  Admission diagnosis:  TIA (transient ischemic attack) [G45.9] CVA (cerebral vascular accident) [facility]) [I63.9] Hypertensive emergency [I16.1] Patient Active Problem List   Diagnosis Date Noted  . TIA (transient ischemic attack) 10/02/2020  . Accelerated hypertension 10/02/2020  . CVA (cerebral vascular accident) (HCC) 10/02/2020   PCP:  Patient, No Pcp Per Pharmacy:   [id number] - [place], [place] - [address] AT [facility] [address] [place] [address] Phone: [phone] Fax: [phone]     Social Determinants of Health (SDOH) Interventions    Readmission Risk Interventions No flowsheet data found.

## 2020-10-03 NOTE — Progress Notes (Addendum)
STROKE TEAM PROGRESS NOTE   INTERVAL HISTORY Pt is examined at the bedside.  No family present at bedside.  Patient denies any complaints.  States he had 2 episodes of left-sided weakness , slurring speech and facial droop lasting 15 to 20 minutes.  He states his symptoms completely resolved.  Patient denies any weakness, numbness, slurring speech, facial droop right now. No h/o stroke or TIA in the past. .  No history of seizures.  Advised to stop smoking.  On neurological examination today, patient alert, oriented x4, normal strength in all 4 extremities, no focal deficits present on exam.  Patient blood pressure elevated at 160/ 124 mmHg, afebrile.  CT head shows chronic lacunar infarct at the anterior limb of the right internal capsule, CTA head and neck normal, MRI brain showed acute right basal ganglia infarct, negative MRA head and neck.  ECHO showed LVEF 55 to 60%.  Patient was started on aspirin 81 mg, Lipitor 40 mg daily, Plavix 75 mg daily. TSH 1.535, HbA1c 5.6,  K 3.3, creatinine 1.29.  He is on losartan 25 mg daily for blood pressure control.  Vascular ultrasound of lower extremities scheduled for today. Hypercoagulable work-up pending. RPR and Vitamin B12 ordered.  TEE pending, scheduled for tomorrow.  Vitals:   10/03/20 0013 10/03/20 0416 10/03/20 0728 10/03/20 1132  BP: (!) 153/106 (!) 131/98 (!) 147/101 (!) 160/124  Pulse: 70 79 87 75  Resp: 18 18 18 16   Temp: 98.4 F (36.9 C) 98.2 F (36.8 C) 98.2 F (36.8 C) 98 F (36.7 C)  TempSrc: Oral Oral Oral   SpO2: 100% 99% 98% 100%   CBC:  Recent Labs  Lab 10/01/20 2120 10/01/20 2131  WBC 9.6  --   NEUTROABS 4.6  --   HGB 15.0 16.3  HCT 44.8 48.0  MCV 83.9  --   PLT 203  --    Basic Metabolic Panel:  Recent Labs  Lab 10/02/20 0310 10/03/20 0318  NA 138 138  K 3.2* 3.3*  CL 105 105  CO2 24 24  GLUCOSE 119* 101*  BUN 9 10  CREATININE 1.11 1.29*  CALCIUM 8.8* 8.6*  MG  --  2.2   Lipid Panel:  Recent Labs  Lab  10/02/20 0310  CHOL 159  TRIG 98  HDL 35*  CHOLHDL 4.5  VLDL 20  LDLCALC 11/30/20*  HgbA1c:  Recent Labs  Lab 10/02/20 0310  HGBA1C 5.6   Urine Drug Screen:  Recent Labs  Lab 10/01/20 2120  LABOPIA NONE DETECTED  COCAINSCRNUR NONE DETECTED  LABBENZ NONE DETECTED  AMPHETMU NONE DETECTED  THCU POSITIVE*  LABBARB NONE DETECTED    Alcohol Level  Recent Labs  Lab 10/01/20 2120  ETH <10    IMAGING past 24 hours No results found.  PHYSICAL EXAM GENERAL: Awake, alert in NAD HEENT: - Normocephalic and atraumatic LUNGS - Symmetrical chest rise, No labored breathing noted CV - no JVD, No Peripheral Edema ABDOMEN - Soft,  nondistended  Ext: warm, well perfused, intact peripheral pulses, no Peripheral edema  NEURO Exam:   Mental Status: AA&Ox4, Oriented to self, age, place, situation, Good Attention and Concentration Language: speech is fluent. Fuency, and comprehension intact. Cranial Nerves:   CN II Pupils equal and reactive to light, no VF deficits    CN III,IV,VI EOM intact, no gaze preference or deviation, no nystagmus    CN V normal sensation in V1, V2, and V3 segments bilaterally    CN VII no asymmetry, no nasolabial  fold flattening    CN VIII normal hearing to speech    CN IX & X normal palatal elevation, no uvular deviation    CN XI 5/5 head turn and 5/5 shoulder shrug bilaterally    CN XII midline tongue protrusion    Motor: No Drift in Upper Extremities, No Drift in LE's. Strength 5/5 in Rt UE, 5/5 in Lt UE, Strength in Rt LE  5/5, Lt LE 5/5 Tone: is normal and bulk is normal Sensation- Intact to light touch bilaterally Coordination: FTN intact bilaterally, no ataxia.  Gait- deferred   ASSESSMENT/PLAN William Cantu is a 35 y.o. male with no past medical history of presented with slurred speech, left-sided weakness, left-sided facial droop.  His symptoms resolved in 15 minutes before coming to ED. Patient had another similar episode lasting for 15  minutes. Patient blood pressure elevated at 164/117 mmHg, at admission.  CT head shows chronic lacunar infarct at the anterior limb of the right internal capsule, CTA normal, MRI brain showed acute right basal ganglia infarct, negative MRA head and neck.  ECHO showed LVEF 55 to 60%.  HBa1c 5.6, LDL 104, creatinine 1.29. Patient was started on aspirin 81 mg, Lipitor 40 mg daily, Plavix 75 mg daily.  He is on losartan 25 mg daily for blood pressure control.  Vascular ultrasound of lower extremities scheduled. Hypercoagulable work-up pending.  TEE pending, scheduled for tomorrow. RPR and Vitamin B12 ordered.   Stroke - Acute right BG infarct, still more likely small vessel diease due to risk factors of uncontrolled HTN and smoking  CT head- Chronic lacunar infarct at the anterior limb of the right internal capsule. Patchy hypodensity involving the supratentorial cerebral white matter  CTA head & neck -Normal.   MRI  acute right basal ganglia infarct  MRA  Normal  2D Echo - LVEF 55 to 60%.  LE venous Doppler pending  LDL 104  TEE Pending  Hypercoagulable work up  - Pending   RPR - pending  Vitamin B12- Pending  HgbA1c 5.6  VTE prophylaxis - Enoxaparin  No antithrombotic prior to admission, now on aspirin 81 mg daily and clopidogrel 75 mg daily.  Continue DAPT for 3 weeks and then aspirin alone.  Therapy recommendations:  pending  Disposition:  TBD  Hypertension  Home meds:  None  Stable but on the higher end . Permissive hypertension (OK if < 220/120) but gradually normalize in 2-3 days . Long-term BP goal normotensive  Hyperlipidemia  Home meds:  None   LDL 104, goal < 70  Lipitor 40 mg daily started in the hospital.   Continue statin at discharge  Smoking   Pt is everyday smoker.  Pt is advised to stop smoking.   Can offer Nicotine patch.  Other Stroke Risk Factors  Substance abuse - UDS:  THC POSITIVE Patient advised to stop using due to stroke  risk.  Other problems  Hypokalemia- K 3.3, Repleted.   Hospital day # 1   ATTENDING NOTE: I reviewed above note and agree with the assessment and plan. Pt was seen and examined.   35 year old male with no documented past medical history however now known to have uncontrolled hypertension and smoking presented to ED for TIA x1 and then another TIA while the ED.  Both time patient had slurred speech, left-sided weakness and left facial droop, resolved within 20 minutes.  CT head showed chronic right anterior limb IC small lacunar infarct.  CTA head and neck negative.  MRI showed  right BG/CR infarct.  MRA head and neck negative.  EF 55 to 60%.  LDL 104, A1c 5.6.  UDS showed positive THC, hypercoagulable work-up pending, LE venous Doppler pending.  Given his young age, would recommend TEE to further rule out cardiac source.  Currently patient lying in bed, no family at bedside.  Neurologically intact.  Still has hypertension BP 140s 160s, however will do permissive hypertension for 24 hours and then gradually normalize BP.  Currently on aspirin and Plavix DAPT for 3 weeks and then aspirin alone.  Continue statin.  Smoking cessation education provided.  Stroke risk factor modification.  Will follow.  Marvel Plan, MD PhD Stroke Neurology 10/03/2020 1:39 PM     To contact Stroke Continuity provider, please refer to [email or web address]. After hours, contact General Neurology

## 2020-10-03 NOTE — Progress Notes (Signed)
[facility] Progress Note  Patient: William Cantu    XHF:[id number]  DOA: 10/01/2020     Date of Service: the patient was seen and examined on 10/03/2020  Brief hospital course: Past medical history of hypertension.  Presents with complaints of left-sided weakness and facial droop.  Found to have acute CVA.  Neurology consulted.  Initially admitted to [facility].  Currently transferred to [facility]. Currently plan is for the stroke work-up.  Assessment and Plan: 1.  Acute right basal ganglia infarct Most likely small vessel disease. CTA head and neck on remarkable for any large vessel occlusion. MRI brain confirms infarct. Echocardiogram shows 50 to 55% EF. Lower extremity venous Doppler negative for DVT. TEE currently pending. Hypercoagulable panel also pending. B12 normal.  RPR on remarkable. Hemoglobin A1c 5.6. LDL 104 on statin. No therapy prior to admission.  Currently on 81 mg aspirin and 75 mg Plavix.  Continue dual antiplatelets for 3 weeks then aspirin alone. PT OT recommendation currently pending.  2.  Essential hypertension, uncontrolled Blood pressure significantly elevated. Currently allowing permissive hypertension. Continue medication.  3.  HLD LDL 104. Lipitor 40 mg. Monitor.  4.  Active smoker Patient every day smoker. Although tells me that he smokes 2 to 3 cigarettes a day at max. Recommended to quit smoking which patient currently willing to. Does not request a nicotine patch for now.  5.  Marijuana use. UDS for THC positive. Recommend to stop using it given increased stroke risk  6.  Hypokalemia Repleted.  We will recheck tomorrow.  May need outpatient work-up to rule out Addison's disease.  Diet: Cardiac diet DVT Prophylaxis:   enoxaparin (LOVENOX) injection 40 mg Start: 10/02/20 1000    Advance goals of care discussion: Full code  Family Communication: no family was present at bedside, at the time of interview.    Disposition:  Status is: Inpatient  Remains inpatient appropriate because:Ongoing diagnostic testing needed not appropriate for outpatient work up and IV treatments appropriate due to intensity of illness or inability to take PO   Dispo:  Patient From: Home  Planned Disposition: Home  Expected discharge date: 10/04/2020  Medically stable for discharge: No          Subjective: No nausea no vomiting.  No fever no chills.  Continues to have fatigue and tiredness.  Reports mild headache.  Physical Exam:  General: Appear in mild distress, no Rash; Oral Mucosa Clear, moist. no Abnormal Neck Mass Or lumps, Conjunctiva normal  Cardiovascular: S1 and S2 Present, no Murmur, Respiratory: good respiratory effort, Bilateral Air entry present and CTA, no Crackles, no wheezes Abdomen: Bowel Sound present, Soft and no tenderness Extremities: no Pedal edema Neurology: alert and oriented to time, place, and person affect appropriate. no new focal deficit Gait not checked due to patient safety concerns    Vitals:   10/03/20 0013 10/03/20 0416 10/03/20 0728 10/03/20 1132  BP: (!) 153/106 (!) 131/98 (!) 147/101 (!) 160/124  Pulse: 70 79 87 75  Resp: 18 18 18 16   Temp: 98.4 F (36.9 C) 98.2 F (36.8 C) 98.2 F (36.8 C) 98 F (36.7 C)  TempSrc: Oral Oral Oral   SpO2: 100% 99% 98% 100%   No intake or output data in the 24 hours ending 10/03/20 1858 There were no vitals filed for this visit.  Data Reviewed: I have personally reviewed and interpreted daily labs, tele strips, imaging. I reviewed all nursing notes, pharmacy notes, vitals, pertinent old records I have discussed  plan of care as described above with RN and patient/family.  CBC: Recent Labs  Lab 10/01/20 2120 10/01/20 2131  WBC 9.6  --   NEUTROABS 4.6  --   HGB 15.0 16.3  HCT 44.8 48.0  MCV 83.9  --   PLT 203  --    Basic Metabolic Panel: Recent Labs  Lab 10/01/20 2120 10/01/20 2131 10/02/20 0310 10/03/20 0318   NA 142 142 138 138  K 3.4* 3.4* 3.2* 3.3*  CL 106 104 105 105  CO2 26  --  24 24  GLUCOSE 81 78 119* 101*  BUN 11 11 9 10   CREATININE 1.22 1.20 1.11 1.29*  CALCIUM 9.4  --  8.8* 8.6*  MG  --   --   --  2.2    Studies: VAS LOWER EXTREMITY VENOUS (DVT)  Result Date: 10/03/2020  Lower Venous DVT Study Indications: Embolic stroke.  Comparison Study: No prior studies. Performing Technologist: 12/01/2020 RDMS  Examination Guidelines: A complete evaluation includes B-mode imaging, spectral Doppler, color Doppler, and power Doppler as needed of all accessible portions of each vessel. Bilateral testing is considered an integral part of a complete examination. Limited examinations for reoccurring indications may be performed as noted. The reflux portion of the exam is performed with the patient in reverse Trendelenburg.  +---------+---------------+---------+-----------+----------+--------------+ RIGHT    CompressibilityPhasicitySpontaneityPropertiesThrombus Aging +---------+---------------+---------+-----------+----------+--------------+ CFV      Full           Yes      Yes                                 +---------+---------------+---------+-----------+----------+--------------+ SFJ      Full                                                        +---------+---------------+---------+-----------+----------+--------------+ FV Prox  Full                                                        +---------+---------------+---------+-----------+----------+--------------+ FV Mid   Full                                                        +---------+---------------+---------+-----------+----------+--------------+ FV DistalFull                                                        +---------+---------------+---------+-----------+----------+--------------+ PFV      Full                                                         +---------+---------------+---------+-----------+----------+--------------+ POP  Full           Yes      Yes                                 +---------+---------------+---------+-----------+----------+--------------+ PTV      Full                                                        +---------+---------------+---------+-----------+----------+--------------+ PERO     Full                                                        +---------+---------------+---------+-----------+----------+--------------+   +---------+---------------+---------+-----------+----------+--------------+ LEFT     CompressibilityPhasicitySpontaneityPropertiesThrombus Aging +---------+---------------+---------+-----------+----------+--------------+ CFV      Full           Yes      Yes                                 +---------+---------------+---------+-----------+----------+--------------+ SFJ      Full                                                        +---------+---------------+---------+-----------+----------+--------------+ FV Prox  Full                                                        +---------+---------------+---------+-----------+----------+--------------+ FV Mid   Full                                                        +---------+---------------+---------+-----------+----------+--------------+ FV DistalFull                                                        +---------+---------------+---------+-----------+----------+--------------+ PFV      Full                                                        +---------+---------------+---------+-----------+----------+--------------+ POP      Full           Yes      Yes                                 +---------+---------------+---------+-----------+----------+--------------+  PTV      Full                                                         +---------+---------------+---------+-----------+----------+--------------+ PERO     Full                                                        +---------+---------------+---------+-----------+----------+--------------+     Summary: RIGHT: - There is no evidence of deep vein thrombosis in the lower extremity.  - No cystic structure found in the popliteal fossa.  LEFT: - There is no evidence of deep vein thrombosis in the lower extremity.  - No cystic structure found in the popliteal fossa.  *See table(s) above for measurements and observations. Electronically signed by Lemar Livings MD on 10/03/2020 at 4:20:07 PM.    Final     Scheduled Meds: .  stroke: mapping our early stages of recovery book   Does not apply Once  . aspirin  81 mg Oral Daily  . atorvastatin  40 mg Oral Daily  . clopidogrel  75 mg Oral Daily  . enoxaparin (LOVENOX) injection  40 mg Subcutaneous Q24H  . losartan  25 mg Oral Daily   Continuous Infusions: PRN Meds: acetaminophen **OR** acetaminophen (TYLENOL) oral liquid 160 mg/5 mL **OR** acetaminophen, hydrALAZINE  Time spent: 35 minutes  Author: Lynden Oxford, MD [facility] 10/03/2020 6:58 PM  To reach On-call, see care teams to locate the attending and reach out via [email or web address]. Between 7PM-7AM, please contact night-coverage If you still have difficulty reaching the attending provider, please page the [facility] (Director on Call) for [facility] on amion for assistance.

## 2020-10-04 ENCOUNTER — Inpatient Hospital Stay (HOSPITAL_COMMUNITY): Payer: Self-pay

## 2020-10-04 ENCOUNTER — Inpatient Hospital Stay (HOSPITAL_COMMUNITY): Payer: Self-pay | Admitting: Certified Registered Nurse Anesthetist

## 2020-10-04 ENCOUNTER — Encounter (HOSPITAL_COMMUNITY): Payer: Self-pay | Admitting: Internal Medicine

## 2020-10-04 ENCOUNTER — Encounter (HOSPITAL_COMMUNITY)
Admission: EM | Disposition: A | Discharge status: Home / Self Care | Payer: Self-pay | Source: Home / Self Care | Attending: Internal Medicine

## 2020-10-04 ENCOUNTER — Other Ambulatory Visit: Payer: Self-pay | Admitting: Internal Medicine

## 2020-10-04 DIAGNOSIS — I639 Cerebral infarction, unspecified: Secondary | ICD-10-CM

## 2020-10-04 DIAGNOSIS — I10 Essential (primary) hypertension: Secondary | ICD-10-CM

## 2020-10-04 HISTORY — PX: BUBBLE STUDY: SHX6837

## 2020-10-04 HISTORY — PX: TEE WITHOUT CARDIOVERSION: SHX5443

## 2020-10-04 LAB — BETA-2-GLYCOPROTEIN I ABS, IGG/M/A
Beta-2 Glyco I IgG: <9 GPI IgG units (ref 0–20)
Beta-2-Glycoprotein I IgA: <9 GPI IgA units (ref 0–25)
Beta-2-Glycoprotein I IgM: <9 GPI IgM units (ref 0–32)

## 2020-10-04 LAB — CARDIOLIPIN ANTIBODIES, IGG, IGM, IGA
Anticardiolipin IgA: <9 APL U/mL (ref 0–11)
Anticardiolipin IgG: <9 GPL U/mL (ref 0–14)
Anticardiolipin IgM: <9 MPL U/mL (ref 0–12)

## 2020-10-04 LAB — PROTEIN C, TOTAL: Protein C, Total: 95 % (ref 60–150)

## 2020-10-04 LAB — RPR: RPR Ser Ql: NONREACTIVE

## 2020-10-04 SURGERY — ECHOCARDIOGRAM, TRANSESOPHAGEAL
Anesthesia: Monitor Anesthesia Care

## 2020-10-04 MED ORDER — ATORVASTATIN CALCIUM 40 MG PO TABS: 40.0000 mg | ORAL_TABLET | Freq: Every day | ORAL | 0 refills | Status: DC

## 2020-10-04 MED ORDER — NICOTINE 7 MG/24HR TD PT24: 7.0000 mg | MEDICATED_PATCH | TRANSDERMAL | 0 refills | Status: DC

## 2020-10-04 MED ORDER — HYDRALAZINE HCL 20 MG/ML IJ SOLN
INTRAMUSCULAR | Status: AC
  Filled 2020-10-04: qty 1

## 2020-10-04 MED ORDER — SODIUM CHLORIDE 0.9 % IV SOLN: INTRAVENOUS | Status: DC

## 2020-10-04 MED ORDER — AMLODIPINE BESYLATE 5 MG PO TABS: 5.0000 mg | ORAL_TABLET | Freq: Every day | ORAL | 0 refills | Status: DC

## 2020-10-04 MED ORDER — SODIUM CHLORIDE 0.9 % IV SOLN: INTRAVENOUS | Status: DC | PRN

## 2020-10-04 MED ORDER — PROPOFOL 500 MG/50ML IV EMUL
INTRAVENOUS | Status: DC | PRN
  Administered 2020-10-04: 125 ug/kg/min via INTRAVENOUS

## 2020-10-04 MED ORDER — DEXMEDETOMIDINE (PRECEDEX) IN NS 20 MCG/5ML (4 MCG/ML) IV SYRINGE
PREFILLED_SYRINGE | INTRAVENOUS | Status: DC | PRN
  Administered 2020-10-04 (×2): 8 ug via INTRAVENOUS
  Administered 2020-10-04: 4 ug via INTRAVENOUS

## 2020-10-04 MED ORDER — PROPOFOL 10 MG/ML IV BOLUS
INTRAVENOUS | Status: DC | PRN
  Administered 2020-10-04 (×3): 50 mg via INTRAVENOUS

## 2020-10-04 MED ORDER — CLOPIDOGREL BISULFATE 75 MG PO TABS: 75.0000 mg | ORAL_TABLET | Freq: Every day | ORAL | 0 refills | Status: AC

## 2020-10-04 MED ORDER — ASPIRIN EC 81 MG PO TBEC: 81.0000 mg | DELAYED_RELEASE_TABLET | Freq: Every day | ORAL | 2 refills | Status: AC

## 2020-10-04 MED ORDER — AMLODIPINE BESYLATE 5 MG PO TABS: 5.0000 mg | ORAL_TABLET | Freq: Every day | ORAL | Status: DC

## 2020-10-04 MED ORDER — ESMOLOL HCL 100 MG/10ML IV SOLN
INTRAVENOUS | Status: DC | PRN
  Administered 2020-10-04 (×2): 10 mg via INTRAVENOUS

## 2020-10-04 MED FILL — ASPIRIN LOW DOSE 81 MG TBEC: 81 | 90 days supply | Qty: 90 | Fill #0

## 2020-10-04 MED FILL — CLOPIDOGREL 75 MG TABLET: 75 | 21 days supply | Qty: 21 | Fill #0

## 2020-10-04 MED FILL — AMLODIPINE BESYLATE 5 MG TA: 5 | 30 days supply | Qty: 30 | Fill #0

## 2020-10-04 MED FILL — ATORVASTATIN CALCIUM 40 MG: 40 | 30 days supply | Qty: 30 | Fill #0

## 2020-10-04 NOTE — Progress Notes (Signed)
Patient ready for discharge to home; discharge instructions given and reviewed; Rx received from pharmacy. Patient discharged out via wheelchair accompanied home by his father.  Stroke education reviewed and completed.

## 2020-10-04 NOTE — Progress Notes (Addendum)
STROKE TEAM PROGRESS NOTE   INTERVAL HISTORY No acute events overnight.   Pt is examined at the bedside.  No family present at bedside.  Patient denies any complaints.  Neuro examination on unremarkable.  Anticardiolipin Antibody- Neg, protein C and S activity normal, protein S normal lupus anticoagulant normal, beta-2 glycoprotein normal, homocystine level normal, RPR non reactive, Vitamin B12 level normal at 418. Vascular USG lower extremities negative for DVT.  Transcranial Doppler with bubbles normal.  TEE normal. No more Neurological testing needed.  Recommend DAPT for 3 weeks and then aspirin alone. Neurology will sign off.   Vitals:   10/03/20 2200 10/04/20 0007 10/04/20 0307 10/04/20 0836  BP:  (!) 159/104 (!) 153/99 (!) 128/97  Pulse:  97 87 87  Resp:  18 18 18   Temp:  98.6 F (37 C) 98.4 F (36.9 C) 98 F (36.7 C)  TempSrc:  Oral Oral Oral  SpO2:  98% 95% 99%  Weight: 83.9 kg     Height: 5\' 7"  (1.702 m)      CBC:  Recent Labs  Lab 10/01/20 2120 10/01/20 2131  WBC 9.6  --   NEUTROABS 4.6  --   HGB 15.0 16.3  HCT 44.8 48.0  MCV 83.9  --   PLT 203  --    Basic Metabolic Panel:  Recent Labs  Lab 10/02/20 0310 10/03/20 0318  NA 138 138  K 3.2* 3.3*  CL 105 105  CO2 24 24  GLUCOSE 119* 101*  BUN 9 10  CREATININE 1.11 1.29*  CALCIUM 8.8* 8.6*  MG  --  2.2   Lipid Panel:  Recent Labs  Lab 10/02/20 0310  CHOL 159  TRIG 98  HDL 35*  CHOLHDL 4.5  VLDL 20  LDLCALC 12/01/20*  HgbA1c:  Recent Labs  Lab 10/02/20 0310  HGBA1C 5.6   Urine Drug Screen:  Recent Labs  Lab 10/01/20 2120  LABOPIA NONE DETECTED  COCAINSCRNUR NONE DETECTED  LABBENZ NONE DETECTED  AMPHETMU NONE DETECTED  THCU POSITIVE*  LABBARB NONE DETECTED    Alcohol Level  Recent Labs  Lab 10/01/20 2120  ETH <10    IMAGING past 24 hours VAS 11/29/20 LOWER EXTREMITY VENOUS (DVT)  Result Date: 10/03/2020  Lower Venous DVT Study Indications: Embolic stroke.  Comparison Study: No prior  studies. Performing Technologist: [place] RDMS  Examination Guidelines: A complete evaluation includes B-mode imaging, spectral Doppler, color Doppler, and power Doppler as needed of all accessible portions of each vessel. Bilateral testing is considered an integral part of a complete examination. Limited examinations for reoccurring indications may be performed as noted. The reflux portion of the exam is performed with the patient in reverse Trendelenburg.  +---------+---------------+---------+-----------+----------+--------------+ RIGHT    CompressibilityPhasicitySpontaneityPropertiesThrombus Aging +---------+---------------+---------+-----------+----------+--------------+ CFV      Full           Yes      Yes                                 +---------+---------------+---------+-----------+----------+--------------+ SFJ      Full                                                        +---------+---------------+---------+-----------+----------+--------------+ FV Prox  Full                                                        +---------+---------------+---------+-----------+----------+--------------+  FV Mid   Full                                                        +---------+---------------+---------+-----------+----------+--------------+ FV DistalFull                                                        +---------+---------------+---------+-----------+----------+--------------+ PFV      Full                                                        +---------+---------------+---------+-----------+----------+--------------+ POP      Full           Yes      Yes                                 +---------+---------------+---------+-----------+----------+--------------+ PTV      Full                                                        +---------+---------------+---------+-----------+----------+--------------+ PERO     Full                                                         +---------+---------------+---------+-----------+----------+--------------+   +---------+---------------+---------+-----------+----------+--------------+ LEFT     CompressibilityPhasicitySpontaneityPropertiesThrombus Aging +---------+---------------+---------+-----------+----------+--------------+ CFV      Full           Yes      Yes                                 +---------+---------------+---------+-----------+----------+--------------+ SFJ      Full                                                        +---------+---------------+---------+-----------+----------+--------------+ FV Prox  Full                                                        +---------+---------------+---------+-----------+----------+--------------+ FV Mid   Full                                                        +---------+---------------+---------+-----------+----------+--------------+  FV DistalFull                                                        +---------+---------------+---------+-----------+----------+--------------+ PFV      Full                                                        +---------+---------------+---------+-----------+----------+--------------+ POP      Full           Yes      Yes                                 +---------+---------------+---------+-----------+----------+--------------+ PTV      Full                                                        +---------+---------------+---------+-----------+----------+--------------+ PERO     Full                                                        +---------+---------------+---------+-----------+----------+--------------+     Summary: RIGHT: - There is no evidence of deep vein thrombosis in the lower extremity.  - No cystic structure found in the popliteal fossa.  LEFT: - There is no evidence of deep vein thrombosis in the lower extremity.  - No cystic  structure found in the popliteal fossa.  *See table(s) above for measurements and observations. Electronically signed by Lemar Livings MD on 10/03/2020 at 4:20:07 PM.    Final     PHYSICAL EXAM GENERAL: Awake, alert in NAD HEENT: - Normocephalic and atraumatic LUNGS - Symmetrical chest rise, No labored breathing noted CV - no JVD, No Peripheral Edema ABDOMEN - Soft,  nondistended  Ext: warm, well perfused, intact peripheral pulses, no Peripheral edema  NEURO Exam:   Mental Status: AA&Ox4, Oriented to self, age, place, situation, Good Attention and Concentration Language: speech is fluent. Fuency, and comprehension intact. Cranial Nerves:   CN II Pupils equal and reactive to light, no VF deficits    CN III,IV,VI EOM intact, no gaze preference or deviation, no nystagmus    CN V normal sensation in V1, V2, and V3 segments bilaterally    CN VII no asymmetry, no nasolabial fold flattening    CN VIII normal hearing to speech    CN IX & X normal palatal elevation, no uvular deviation    CN XI 5/5 head turn and 5/5 shoulder shrug bilaterally    CN XII midline tongue protrusion    Motor: No Drift in Upper Extremities, No Drift in LE's. Strength 5/5 in Rt UE, 5/5 in Lt UE, Strength in Rt LE  5/5, Lt LE 5/5 Tone: is normal and bulk is normal Sensation- Intact to light touch bilaterally Coordination: FTN intact bilaterally,  no ataxia.  Gait- deferred   ASSESSMENT/PLAN [name] is a 35 y.o. male with no past medical history of presented with slurred speech, left-sided weakness, left-sided facial droop.  His symptoms resolved in 15 minutes before coming to ED. Patient had another similar episode lasting for 15 minutes. Patient blood pressure elevated at 164/117 mmHg, at admission.  CT head shows chronic lacunar infarct at the anterior limb of the right internal capsule, CTA normal, MRI brain showed acute right basal ganglia infarct, negative MRA head and neck.  ECHO showed LVEF 55 to 60%.   HBa1c 5.6, LDL 104, creatinine 1.29. Patient was started on aspirin 81 mg, Lipitor 40 mg daily, Plavix 75 mg daily.  He is on losartan 25 mg daily for blood pressure control.  Vascular ultrasound of lower extremities scheduled.  Anticardiolipin Antibody- Neg, protein C and S activity normal, protein S normal lupus anticoagulant normal, beta-2 glycoprotein normal, homocystine level normal RPR nonreactive, Vitamin B12 level normal at 418. Vascular USG lower extremities negative for DVT.  Transcranial Doppler with bubbles normal.  TEE Normal.  No more Neurological testing needed. Neurology will sign off.   Stroke - Acute right BG infarct, still more likely small vessel diease due to risk factors of uncontrolled HTN and smoking  CT head- Chronic lacunar infarct at the anterior limb of the right internal capsule. Patchy hypodensity involving the supratentorial cerebral white matter  CTA head & neck -Normal.   MRI  acute right basal ganglia infarct  MRA  Normal  2D Echo - LVEF 55 to 60%.  LE venous Doppler No DVT  TEE Normal  Transcranial Doppler with bubbles no PFO  Hypercoagulable work up - neg so far but Prothrombin gene mutation, factor V Leiden, protein C total -pending  RPR - neg  LDL 104  Vitamin B12 = 418  HgbA1c 5.6  VTE prophylaxis - Enoxaparin  No antithrombotic prior to admission, now on aspirin 81 mg daily and clopidogrel 75 mg daily.  Continue DAPT for 3 weeks and then aspirin alone.  Therapy recommendations: none  Disposition:  Home    Hypertension  Home meds:  None  Stable but on the higher end . Gradually normalize in 2-3 days . Long-term BP goal normotensive  Hyperlipidemia  Home meds:  None   LDL 104, goal < 70  Lipitor 40 mg daily started in the hospital.   Continue statin at discharge  Smoking   Pt is everyday smoker.  Pt is advised to stop smoking.   Can offer Nicotine patch.  Other Stroke Risk Factors  Substance abuse - UDS:  THC  POSITIVE Patient advised to stop using due to stroke risk.   Hospital day # 2  Neurology will sign off. Please call with questions. Pt will follow up with stroke clinic [facility] at [facility] in about 4 weeks. Thanks for the consult.  Marvel Plan, MD PhD Stroke Neurology 10/04/2020 2:50 PM    To contact Stroke Continuity provider, please refer to [email or web address]. After hours, contact General Neurology

## 2020-10-04 NOTE — Transfer of Care (Signed)
Immediate Anesthesia Transfer of Care Note  Patient: Kaiyu Mirabal  Procedure(s) Performed: TRANSESOPHAGEAL ECHOCARDIOGRAM (TEE) (N/A ) BUBBLE STUDY  Patient Location: Endoscopy Unit  Anesthesia Type:MAC  Level of Consciousness: awake, alert , oriented and patient cooperative  Airway & Oxygen Therapy: Patient Spontanous Breathing and Patient connected to nasal cannula oxygen  Post-op Assessment: Report given to RN, Post -op Vital signs reviewed and stable and Patient moving all extremities  Post vital signs: Reviewed and stable  Last Vitals:  Vitals Value Taken Time  BP    Temp    Pulse 120 10/04/20 1235  Resp 27 10/04/20 1235  SpO2 100 % 10/04/20 1235  Vitals shown include unvalidated device data.  Last Pain:  Vitals:   10/04/20 1130  TempSrc: Oral  PainSc: 0-No pain      Patients Stated Pain Goal: 0 (52/08/02 2336)  Complications: No complications documented.

## 2020-10-04 NOTE — Progress Notes (Signed)
TCD w/ bubble study completed.   Please see CV Proc for preliminary results.   Makisha Marrin, RDMS  

## 2020-10-04 NOTE — Interval H&P Note (Signed)
History and Physical Interval Note:  10/04/2020 11:25 AM  William Cantu  has presented today for surgery, with the diagnosis of STROKE.  The various methods of treatment have been discussed with the patient and family. After consideration of risks, benefits and other options for treatment, the patient has consented to  Procedure(s): TRANSESOPHAGEAL ECHOCARDIOGRAM (TEE) (N/A) as a surgical intervention.  The patient's history has been reviewed, patient examined, no change in status, stable for surgery.  I have reviewed the patient's chart and labs.  Questions were answered to the patient's satisfaction.     [name]

## 2020-10-04 NOTE — Anesthesia Preprocedure Evaluation (Signed)
Anesthesia Evaluation  Patient identified by MRN, date of birth, ID band Patient awake    Reviewed: Allergy & Precautions, H&P , NPO status , Patient's Chart, lab work & pertinent test results  Airway Mallampati: II  TM Distance: >3 FB Neck ROM: Full    Dental  (+) Dental Advisory Given, Teeth Intact   Pulmonary neg pulmonary ROS, Current Smoker,    Pulmonary exam normal breath sounds clear to auscultation       Cardiovascular hypertension, Normal cardiovascular exam Rhythm:Regular Rate:Normal     Neuro/Psych TIACVA negative psych ROS   GI/Hepatic negative GI ROS, Neg liver ROS,   Endo/Other  negative endocrine ROS  Renal/GU negative Renal ROS     Musculoskeletal negative musculoskeletal ROS (+)   Abdominal   Peds  Hematology negative hematology ROS (+)   Anesthesia Other Findings   Reproductive/Obstetrics                             Anesthesia Physical Anesthesia Plan  ASA: III  Anesthesia Plan: MAC   Post-op Pain Management:    Induction: Intravenous  PONV Risk Score and Plan: 0 and Propofol infusion, Treatment may vary due to age or medical condition, TIVA and Midazolam  Airway Management Planned: Natural Airway  Additional Equipment: None  Intra-op Plan:   Post-operative Plan:   Informed Consent: I have reviewed the patients History and Physical, chart, labs and discussed the procedure including the risks, benefits and alternatives for the proposed anesthesia with the patient or authorized representative who has indicated his/her understanding and acceptance.     Dental advisory given  Plan Discussed with: CRNA  Anesthesia Plan Comments:         Anesthesia Quick Evaluation

## 2020-10-04 NOTE — Anesthesia Procedure Notes (Signed)
Procedure Name: MAC Date/Time: 10/04/2020 12:04 PM Performed by: Kathryne Hitch, CRNA Pre-anesthesia Checklist: Patient identified, Emergency Drugs available, Suction available and Patient being monitored Patient Re-evaluated:Patient Re-evaluated prior to induction Oxygen Delivery Method: Nasal cannula Preoxygenation: Pre-oxygenation with 100% oxygen Induction Type: IV induction Placement Confirmation: positive ETCO2 Dental Injury: Teeth and Oropharynx as per pre-operative assessment

## 2020-10-04 NOTE — Progress Notes (Signed)
  Echocardiogram Echocardiogram Transesophageal has been performed.  Janalyn Harder 10/04/2020, 1:21 PM

## 2020-10-04 NOTE — Op Note (Signed)
INDICATIONS: cryptogenic stroke/TIA  PROCEDURE:   Informed consent was obtained prior to the procedure. The risks, benefits and alternatives for the procedure were discussed and the patient comprehended these risks.  Risks include, but are not limited to, cough, sore throat, vomiting, nausea, somnolence, esophageal and stomach trauma or perforation, bleeding, low blood pressure, aspiration, pneumonia, infection, trauma to the teeth and death.    After a procedural time-out, the oropharynx was anesthetized with 20% benzocaine spray.   During this procedure the patient was administered IV propofol and precedex by Anesthesiology.  The transesophageal probe was inserted in the esophagus and stomach without difficulty and multiple views were obtained.    The imaging was difficult due to incessant coughing, restlessness and tachycardia (140s).  The patient was kept under observation until the patient left the procedure room.  The patient left the procedure room in stable condition.   Agitated microbubble saline contrast was administered.  COMPLICATIONS:    There were no immediate complications.  FINDINGS:  Difficult study due to incessant movement and coughing, tachycardia, even with propofol sedation. There is evidence of right to left shunting on the saline contrast study. Bubbles are seen in the left atrium with an 8-beat delay, but it is unclear whether they cross the atrial septum with cough or they are due to pulmonary recirculation. No ASD is seen. No PFO seen on 2D and color Doppler imaging. Normal TEE otherwise. No evidence of intracardiac mass/thrombus/vegetation or aortic abnormalities.  RECOMMENDATIONS:    May need repeat evaluation, preferably with saline contrast with transthoracic imaging. Consider evaluation for sleep apnea.  Time Spent Directly with the Patient:  30 minutes   William Cantu 10/04/2020, 12:18 PM

## 2020-10-04 NOTE — Discharge Instructions (Signed)
Managing the Challenge of Quitting Smoking Quitting smoking is a physical and mental challenge. You will face cravings, withdrawal symptoms, and temptation. Before quitting, work with your health care provider to make a plan that can help you manage quitting. Preparation can help you quit and keep you from giving in. How to manage lifestyle changes Managing stress Stress can make you want to smoke, and wanting to smoke may cause stress. It is important to find ways to manage your stress. You might try some of the following:  Practice relaxation techniques. ? Breathe slowly and deeply, in through your nose and out through your mouth. ? Listen to music. ? Soak in a bath or take a shower. ? Imagine a peaceful place or vacation.  Get some support. ? Talk with family or friends about your stress. ? Join a support group. ? Talk with a counselor or therapist.  Get some physical activity. ? Go for a walk, run, or bike ride. ? Play a favorite sport. ? Practice yoga.   Medicines Talk with your health care provider about medicines that might help you deal with cravings and make quitting easier for you. Relationships Social situations can be difficult when you are quitting smoking. To manage this, you can:  Avoid parties and other social situations where people might be smoking.  Avoid alcohol.  Leave right away if you have the urge to smoke.  Explain to your family and friends that you are quitting smoking. Ask for support and let them know you might be a bit grumpy.  Plan activities where smoking is not an option. General instructions Be aware that many people gain weight after they quit smoking. However, not everyone does. To keep from gaining weight, have a plan in place before you quit and stick to the plan after you quit. Your plan should include:  Having healthy snacks. When you have a craving, it may help to: ? Eat popcorn, carrots, celery, or other cut vegetables. ? Chew  sugar-free gum.  Changing how you eat. ? Eat small portion sizes at meals. ? Eat 4-6 small meals throughout the day instead of 1-2 large meals a day. ? Be mindful when you eat. Do not watch television or do other things that might distract you as you eat.  Exercising regularly. ? Make time to exercise each day. If you do not have time for a long workout, do short bouts of exercise for 5-10 minutes several times a day. ? Do some form of strengthening exercise, such as weight lifting. ? Do some exercise that gets your heart beating and causes you to breathe deeply, such as walking fast, running, swimming, or biking. This is very important.  Drinking plenty of water or other low-calorie or no-calorie drinks. Drink 6-8 glasses of water daily.   How to recognize withdrawal symptoms Your body and mind may experience discomfort as you try to get used to not having nicotine in your system. These effects are called withdrawal symptoms. They may include:  Feeling hungrier than normal.  Having trouble concentrating.  Feeling irritable or restless.  Having trouble sleeping.  Feeling depressed.  Craving a cigarette. To manage withdrawal symptoms:  Avoid places, people, and activities that trigger your cravings.  Remember why you want to quit.  Get plenty of sleep.  Avoid coffee and other caffeinated drinks. These may worsen some of your symptoms. These symptoms may surprise you. But be assured that they are normal to have when quitting smoking. How to manage cravings   Come up with a plan for how to deal with your cravings. The plan should include the following:  A definition of the specific situation you want to deal with.  An alternative action you will take.  A clear idea for how this action will help.  The name of someone who might help you with this. Cravings usually last for 5-10 minutes. Consider taking the following actions to help you with your plan to deal with  cravings:  Keep your mouth busy. ? Chew sugar-free gum. ? Suck on hard candies or a straw. ? Brush your teeth.  Keep your hands and body busy. ? Change to a different activity right away. ? Squeeze or play with a ball. ? Do an activity or a hobby, such as making bead jewelry, practicing needlepoint, or working with wood. ? Mix up your normal routine. ? Take a short exercise break. Go for a quick walk or run up and down stairs.  Focus on doing something kind or helpful for someone else.  Call a friend or family member to talk during a craving.  Join a support group.  Contact a quitline. Where to find support To get help or find a support group:  Call the National Cancer Institute's Smoking Quitline: 1-800-QUIT NOW (784-8669)  Visit the website of the Substance Abuse and Mental Health Services Administration: www.samhsa.gov  Text QUIT to SmokefreeTXT: 478848 Where to find more information Visit these websites to find more information on quitting smoking:  National Cancer Institute: www.smokefree.gov  American Lung Association: www.lung.org  American Cancer Society: www.cancer.org  Centers for Disease Control and Prevention: www.cdc.gov  American Heart Association: www.heart.org Contact a health care provider if:  You want to change your plan for quitting.  The medicines you are taking are not helping.  Your eating feels out of control or you cannot sleep. Get help right away if:  You feel depressed or become very anxious. Summary  Quitting smoking is a physical and mental challenge. You will face cravings, withdrawal symptoms, and temptation to smoke again. Preparation can help you as you go through these challenges.  Try different techniques to manage stress, handle social situations, and prevent weight gain.  You can deal with cravings by keeping your mouth busy (such as by chewing gum), keeping your hands and body busy, calling family or friends, or  contacting a quitline for people who want to quit smoking.  You can deal with withdrawal symptoms by avoiding places where people smoke, getting plenty of rest, and avoiding drinks with caffeine. This information is not intended to replace advice given to you by your health care provider. Make sure you discuss any questions you have with your health care provider. Document Revised: 05/30/2019 Document Reviewed: 05/30/2019 Elsevier Patient Education  2021 [employer]  

## 2020-10-04 NOTE — TOC Transition Note (Signed)
Transition of Care [facility]) - CM/SW Discharge Note   Patient Details  Name: Mcdonald Reiling MRN: [id number] Date of Birth: 02-10-1986  Transition of Care [facility]) CM/SW Contact:  Kermit Balo, RN Phone Number: 10/04/2020, 2:36 PM   Clinical Narrative:    Pt is discharging home with self care. Appt for PCP on AVS. Medications for home to be delivered to the room per [facility] pharmacy. Pt feels he can afford the cost.  Pt has transportation home.   Final next level of care: Home/Self Care Barriers to Discharge: Inadequate or no insurance,Barriers Unresolved (comment)   Patient Goals and CMS Choice        Discharge Placement                       Discharge Plan and Services   Discharge Planning Services: CM Consult                                 Social Determinants of Health (SDOH) Interventions     Readmission Risk Interventions No flowsheet data found.

## 2020-10-04 NOTE — Discharge Summary (Signed)
[facility] Discharge Summary   Patient: Katrinka BlazingStedmond Rupert ZOX:[id number]  PCP: Patient, No Pcp Per  Date of admission: 10/01/2020   Date of discharge: 10/04/2020     Discharge Diagnoses:  Principal diagnosis Acute basal ganglia CVA Active Problems:   TIA (transient ischemic attack)   Accelerated hypertension   CVA (cerebral vascular accident) [facility](HCC)   Admitted From: Home Disposition:  Home   Recommendations for Outpatient Follow-up:  1. PCP: Follow-up with PCP follow-up with neurology. 2. Follow up LABS/TEST: None   Follow-up Information    [facility] Follow up on 10/17/2020.   Specialty: Family Medicine Why: Your appointment is at 2:30 pm. Please arrive early and bring a picture ID and your current medications.  Contact information: [address] [place] [phone]       [facility] Follow up.   Why: Please use this location for your pharmacy needs. Contact information: [address] [place] [phone]       [facility]. Schedule an appointment as soon as possible for a visit in 1 month(s).   Specialty: Neurology Contact information: [address] [place] [phone]       [facility] .   Specialty: Cardiology Contact information: [address] [id number] [place] [phone]       [facility] .   Specialty: Vascular Surgery Contact information: [address] [id number] [place] [phone] [phone]             Discharge Instructions    Ambulatory referral to Neurology   Complete by: As directed    An appointment is requested in approximately: 4 weeks with stroke clinic   Ambulatory referral to Sleep Studies   Complete by: As  directed    Diet - low sodium heart healthy   Complete by: As directed    Increase activity slowly   Complete by: As directed       Diet recommendation: Cardiac diet  Activity: The patient is advised to gradually reintroduce usual activities, as tolerated  Discharge Condition: stable  Code Status: Full code   History of present illness: As per the H and P dictated on admission, "Jaquavious Delford FieldWright is a 35 y.o. male with no significant medical history who after a post-work nap awoke with left facial droop, left UE and LE weakness and slightly slurred speech. Of note - in reviewing past records or injury visits to ED as far back as 2019 he was hypertensive with DBP > 110, SBP > 150. Due to his symptoms he presents to [facility]   ED Course: T98.5  179/126  HR 73  R 22. EDP exam, within one hour of arrival was non-focal although triage RN eval was positive for left sided weakness and facial droop. Cmet remarkable for Cr 1.22. 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 TR[facility] called to admit for TIA workup and management of hypertension."  Hospital Course:  Summary of his active problems in the hospital is as following.   1.  Acute right basal ganglia infarct Most likely small vessel disease. CTA head and neck on remarkable for any large vessel occlusion. MRI brain confirms infarct. Echocardiogram shows 50 to 55% EF. Lower extremity venous Doppler negative for DVT. TEE difficult study but normal. Transcranial Doppler no PFO. Hypercoagulable work-up so  far negative but gene mutation still pending. RPR negative. LDL 104 currently on statin. Hemoglobin A1c 5.6. We will be prescribing aspirin and Plavix for 3 weeks followed by aspirin alone for the patient. Patient ambulating without any assistance or difficulty. Symptoms currently resolved.  2.  Essential hypertension, uncontrolled Blood pressure significantly elevated. Currently allowing permissive  hypertension. Continue medication.  Started on losartan. Change to Norvasc for now. Outpatient follow-up with PCP for further adjustment.  3.  HLD LDL 104. Lipitor 40 mg. Monitor.  4.  Active smoker Patient every day smoker. Although tells me that he smokes 2 to 3 cigarettes a day at max. Recommended to quit smoking which patient currently willing to. Does not request a nicotine patch for now.  Nicotine patch prescribed.  5.  Marijuana use. UDS for THC positive. Recommend to stop using it given increased stroke risk  6.  Hypokalemia Repleted.   May need outpatient work-up to rule out Addison's disease. Therefore will change losartan to Norvasc.  Patient was ambulatory without any assistance. On the day of the discharge the patient's vitals were stable, and no other acute medical condition were reported by patient. The patient was felt safe to be discharge at Home with no therapy needed on discharge.  Consultants: Neurology  Procedures: Echocardiogram TEE  DISCHARGE MEDICATION: Allergies as of 10/04/2020   No Known Allergies     Medication List    TAKE these medications   amLODipine 5 MG tablet Commonly known as: NORVASC Take 1 tablet (5 mg total) by mouth daily.   aspirin EC 81 MG tablet Take 1 tablet (81 mg total) by mouth daily. Swallow whole.   atorvastatin 40 MG tablet Commonly known as: LIPITOR Take 1 tablet (40 mg total) by mouth daily. Start taking on: October 05, 2020   clopidogrel 75 MG tablet Commonly known as: PLAVIX Take 1 tablet (75 mg total) by mouth daily for 21 days. Start taking on: October 05, 2020   nicotine 7 mg/24hr patch Commonly known as: NICODERM CQ - dosed in mg/24 hr Place 1 patch (7 mg total) onto the skin daily.       Discharge Exam: Filed Weights   10/03/20 2200  Weight: 83.9 kg   Vitals:   10/04/20 1540 10/04/20 1557  BP: (!) 157/104   Pulse: (!) 111 100  Resp: 20 18  Temp: 98.4 F (36.9 C)   SpO2: 100%     General: Appear in no distress, no Rash; Oral Mucosa Clear, moist. no Abnormal Neck Mass Or lumps, Conjunctiva normal  Cardiovascular: S1 and S2 Present, no Murmur Respiratory: good respiratory effort, Bilateral Air entry present and CTA, no Crackles, no wheezes Abdomen: Bowel Sound present, Soft and no tenderness Extremities: no Pedal edema Neurology: alert and oriented to time, place, and person affect appropriate. no new focal deficit  The results of significant diagnostics from this hospitalization (including imaging, microbiology, ancillary and laboratory) are listed below for reference.    Significant Diagnostic Studies: CT Angio Head W or Wo Contrast  Result Date: 10/01/2020 CLINICAL DATA:  Initial evaluation for acute slurred speech. Possible stroke. EXAM: CT ANGIOGRAPHY HEAD AND NECK TECHNIQUE: Multidetector CT imaging of the head and neck was performed using the standard protocol during bolus administration of intravenous contrast. Multiplanar CT image reconstructions and MIPs were obtained to evaluate the vascular anatomy. Carotid stenosis measurements (when applicable) are obtained utilizing NASCET criteria, using the distal internal carotid diameter as the denominator. CONTRAST:  45mL OMNIPAQUE IOHEXOL 350 MG/ML SOLN  COMPARISON:  None. FINDINGS: CT HEAD FINDINGS Brain: Cerebral volume within normal limits for patient age. Chronic appearing lacunar infarct present at the anterior limb of the right internal capsule. Few scattered patchy hypodensities noted involving the supratentorial cerebral white matter, nonspecific, but favored to reflect age advanced chronic microvascular ischemic disease. No evidence for acute intracranial hemorrhage. No findings to suggest acute large vessel territory infarct. No mass lesion, midline shift, or mass effect. Ventricles are normal in size without evidence for hydrocephalus. No extra-axial fluid collection identified. Vascular: No hyperdense vessel  identified. Skull: Scalp soft tissues demonstrate no acute abnormality. Calvarium intact. Sinuses/Orbits: Globes and orbital soft tissues within normal limits. Scattered mucosal thickening noted within the ethmoidal air cells. Visualized paranasal sinuses are otherwise largely clear. No mastoid effusion. CTA NECK FINDINGS Aortic arch: Visualized aortic arch of normal caliber with normal branch pattern. No hemodynamically significant stenosis about the origin of the great vessels. Right carotid system: Right common and internal carotid arteries widely patent without stenosis, dissection or occlusion. Left carotid system: Left common and internal carotid arteries widely patent without stenosis, dissection or occlusion. Proximal left ICA partially medialized into the retropharyngeal space. Vertebral arteries: Both vertebral arteries arise from the subclavian arteries. No proximal subclavian artery stenosis. Left vertebral artery slightly dominant. Proximal right vertebral artery not well evaluated due to adjacent venous contamination. Visualized portions of the vertebral arteries are widely patent without stenosis dissection or occlusion. Skeleton: No visible acute osseous finding. No discrete or worrisome osseous lesions. Other neck: No other acute soft tissue abnormality within the neck. No mass or adenopathy. Upper chest: Visualized upper chest demonstrates no acute finding. Review of the MIP images confirms the above findings CTA HEAD FINDINGS Anterior circulation: Evaluation of the intracranial circulation technically limited due to a technical error with the scanner during the time of scanning. The upper and mid aspects of the brain are clipped on the CTA portion of this study, with incomplete visualization of the distal arterial vasculature. Both internal carotid arteries are widely patent to the termini without stenosis or other abnormality. A1 segments patent bilaterally. Normal anterior communicating artery  complex. Visualized ACAs widely patent. No M1 stenosis or occlusion. Normal MCA bifurcations. Visualized distal MCA branches well perfused and widely patent bilaterally. Posterior circulation: Both V4 segments widely patent to the vertebrobasilar junction. Both PICA origins patent and normal. Basilar widely patent to its distal aspect without stenosis. Superior cerebellar arteries patent bilaterally. Left PCA supplied via the basilar as well as a robust left posterior communicating artery. Fetal type origin of the right PCA. Visualized PCAs well perfused without stenosis or other abnormality. Venous sinuses: Visualized major dural sinuses are patent. Anatomic variants: Fetal type origin of the right PCA. No visible aneurysm. Review of the MIP images confirms the above findings IMPRESSION: CT HEAD IMPRESSION: 1. No acute intracranial abnormality. 2. Chronic lacunar infarct at the anterior limb of the right internal capsule. 3. Patchy hypodensity involving the supratentorial cerebral white matter, nonspecific, but favored to reflect age advanced chronic microvascular ischemic disease. Sequelae of demyelinating disease would be the primary differential consideration. Finding could be further assessed with dedicated MRI of the brain as clinically warranted. CTA HEAD AND NECK IMPRESSION: 1. Technically limited exam due to a technical error with the scanner during the time of scanning. The upper and mid aspects of the brain are clipped on the CTA portion of this study, with incomplete visualization of the distal arterial vasculature. 2. Negative CTA of the head and neck  allowing for technical limitations as above. No large vessel occlusion, hemodynamically significant stenosis, or other acute vascular abnormality. Electronically Signed   By: Rise Mu M.D.   On: 10/01/2020 22:44   CT Angio Neck W and/or Wo Contrast  Result Date: 10/01/2020 CLINICAL DATA:  Initial evaluation for acute slurred speech. Possible  stroke. EXAM: CT ANGIOGRAPHY HEAD AND NECK TECHNIQUE: Multidetector CT imaging of the head and neck was performed using the standard protocol during bolus administration of intravenous contrast. Multiplanar CT image reconstructions and MIPs were obtained to evaluate the vascular anatomy. Carotid stenosis measurements (when applicable) are obtained utilizing NASCET criteria, using the distal internal carotid diameter as the denominator. CONTRAST:  75mL OMNIPAQUE IOHEXOL 350 MG/ML SOLN COMPARISON:  None. FINDINGS: CT HEAD FINDINGS Brain: Cerebral volume within normal limits for patient age. Chronic appearing lacunar infarct present at the anterior limb of the right internal capsule. Few scattered patchy hypodensities noted involving the supratentorial cerebral white matter, nonspecific, but favored to reflect age advanced chronic microvascular ischemic disease. No evidence for acute intracranial hemorrhage. No findings to suggest acute large vessel territory infarct. No mass lesion, midline shift, or mass effect. Ventricles are normal in size without evidence for hydrocephalus. No extra-axial fluid collection identified. Vascular: No hyperdense vessel identified. Skull: Scalp soft tissues demonstrate no acute abnormality. Calvarium intact. Sinuses/Orbits: Globes and orbital soft tissues within normal limits. Scattered mucosal thickening noted within the ethmoidal air cells. Visualized paranasal sinuses are otherwise largely clear. No mastoid effusion. CTA NECK FINDINGS Aortic arch: Visualized aortic arch of normal caliber with normal branch pattern. No hemodynamically significant stenosis about the origin of the great vessels. Right carotid system: Right common and internal carotid arteries widely patent without stenosis, dissection or occlusion. Left carotid system: Left common and internal carotid arteries widely patent without stenosis, dissection or occlusion. Proximal left ICA partially medialized into the  retropharyngeal space. Vertebral arteries: Both vertebral arteries arise from the subclavian arteries. No proximal subclavian artery stenosis. Left vertebral artery slightly dominant. Proximal right vertebral artery not well evaluated due to adjacent venous contamination. Visualized portions of the vertebral arteries are widely patent without stenosis dissection or occlusion. Skeleton: No visible acute osseous finding. No discrete or worrisome osseous lesions. Other neck: No other acute soft tissue abnormality within the neck. No mass or adenopathy. Upper chest: Visualized upper chest demonstrates no acute finding. Review of the MIP images confirms the above findings CTA HEAD FINDINGS Anterior circulation: Evaluation of the intracranial circulation technically limited due to a technical error with the scanner during the time of scanning. The upper and mid aspects of the brain are clipped on the CTA portion of this study, with incomplete visualization of the distal arterial vasculature. Both internal carotid arteries are widely patent to the termini without stenosis or other abnormality. A1 segments patent bilaterally. Normal anterior communicating artery complex. Visualized ACAs widely patent. No M1 stenosis or occlusion. Normal MCA bifurcations. Visualized distal MCA branches well perfused and widely patent bilaterally. Posterior circulation: Both V4 segments widely patent to the vertebrobasilar junction. Both PICA origins patent and normal. Basilar widely patent to its distal aspect without stenosis. Superior cerebellar arteries patent bilaterally. Left PCA supplied via the basilar as well as a robust left posterior communicating artery. Fetal type origin of the right PCA. Visualized PCAs well perfused without stenosis or other abnormality. Venous sinuses: Visualized major dural sinuses are patent. Anatomic variants: Fetal type origin of the right PCA. No visible aneurysm. Review of the MIP images confirms the  above findings IMPRESSION: CT HEAD IMPRESSION: 1. No acute intracranial abnormality. 2. Chronic lacunar infarct at the anterior limb of the right internal capsule. 3. Patchy hypodensity involving the supratentorial cerebral white matter, nonspecific, but favored to reflect age advanced chronic microvascular ischemic disease. Sequelae of demyelinating disease would be the primary differential consideration. Finding could be further assessed with dedicated MRI of the brain as clinically warranted. CTA HEAD AND NECK IMPRESSION: 1. Technically limited exam due to a technical error with the scanner during the time of scanning. The upper and mid aspects of the brain are clipped on the CTA portion of this study, with incomplete visualization of the distal arterial vasculature. 2. Negative CTA of the head and neck allowing for technical limitations as above. No large vessel occlusion, hemodynamically significant stenosis, or other acute vascular abnormality. Electronically Signed   By: Rise Mu M.D.   On: 10/01/2020 22:44   MR ANGIO HEAD WO CONTRAST  Result Date: 10/02/2020 CLINICAL DATA:  Left-sided weakness, facial droop, and slurred speech. Accelerated hypertension. EXAM: MRI HEAD WITHOUT AND WITH CONTRAST MRA HEAD WITHOUT CONTRAST MRA NECK WITHOUT AND WITH CONTRAST TECHNIQUE: Multiplanar, multiecho pulse sequences of the brain and surrounding structures were obtained without and with intravenous contrast. Angiographic images of the Circle of Willis were obtained using MRA technique without intravenous contrast. Angiographic images of the neck were obtained using MRA technique without and with intravenous contrast. Carotid stenosis measurements (when applicable) are obtained utilizing NASCET criteria, using the distal internal carotid diameter as the denominator. CONTRAST:  8mL GADAVIST GADOBUTROL 1 MMOL/ML IV SOLN COMPARISON:  Head CT 10/02/2020 and head and neck CTA 10/01/2020 FINDINGS: MRI HEAD FINDINGS  Brain: There is an acute right lateral lenticulostriate territory infarct extending from the posterior aspect of the right lentiform nucleus to the caudate body. This is separate from a chronic lacunar infarct which is located more anteriorly in the right basal ganglia. Scattered small foci of T2 hyperintensity in the cerebral white matter bilaterally are moderately advanced for age and nonspecific but compatible with chronic small vessel ischemic disease. No intracranial hemorrhage, mass, midline shift, or extra-axial fluid collection is identified. The ventricles and sulci are normal. No abnormal enhancement is identified. Vascular: Major intracranial vascular flow voids are preserved. Skull and upper cervical spine: Unremarkable bone marrow signal. Sinuses/Orbits: Unremarkable orbits. Moderate mucosal thickening in the ethmoid sinuses with milder mucosal thickening in the other paranasal sinuses and minimal fluid in the left sphenoid sinus. Clear mastoid air cells. Other: None. MRA HEAD FINDINGS The intracranial vertebral arteries are widely patent to the basilar. Patent bilateral PICA, right AICA, and bilateral SCA origins are identified. The basilar artery is widely patent. There are right larger than left posterior communicating arteries with absence of the right P1 segment, a normal variant. Both PCAs are patent without evidence of a significant proximal stenosis. The internal carotid arteries are widely patent from skull base to carotid termini. ACAs and MCAs are patent without evidence of a proximal branch occlusion or significant proximal stenosis. No aneurysm is identified. MRA NECK FINDINGS There is a standard 3 vessel aortic arch. The brachiocephalic and subclavian arteries are widely patent. The common carotid and cervical internal carotid arteries are patent and smooth without evidence of stenosis or dissection. The vertebral arteries are patent with antegrade flow bilaterally and with the left being  mildly dominant. No significant vertebral artery stenosis or dissection is identified. IMPRESSION: 1. Acute right basal ganglia infarct. 2. Age advanced chronic small vessel ischemic disease.  3. Negative head MRA. 4. Negative neck MRA. Electronically Signed   By: Sebastian Ache M.D.   On: 10/02/2020 08:36   MR Angiogram Neck W or Wo Contrast  Result Date: 10/02/2020 CLINICAL DATA:  Left-sided weakness, facial droop, and slurred speech. Accelerated hypertension. EXAM: MRI HEAD WITHOUT AND WITH CONTRAST MRA HEAD WITHOUT CONTRAST MRA NECK WITHOUT AND WITH CONTRAST TECHNIQUE: Multiplanar, multiecho pulse sequences of the brain and surrounding structures were obtained without and with intravenous contrast. Angiographic images of the Circle of Willis were obtained using MRA technique without intravenous contrast. Angiographic images of the neck were obtained using MRA technique without and with intravenous contrast. Carotid stenosis measurements (when applicable) are obtained utilizing NASCET criteria, using the distal internal carotid diameter as the denominator. CONTRAST:  58mL GADAVIST GADOBUTROL 1 MMOL/ML IV SOLN COMPARISON:  Head CT 10/02/2020 and head and neck CTA 10/01/2020 FINDINGS: MRI HEAD FINDINGS Brain: There is an acute right lateral lenticulostriate territory infarct extending from the posterior aspect of the right lentiform nucleus to the caudate body. This is separate from a chronic lacunar infarct which is located more anteriorly in the right basal ganglia. Scattered small foci of T2 hyperintensity in the cerebral white matter bilaterally are moderately advanced for age and nonspecific but compatible with chronic small vessel ischemic disease. No intracranial hemorrhage, mass, midline shift, or extra-axial fluid collection is identified. The ventricles and sulci are normal. No abnormal enhancement is identified. Vascular: Major intracranial vascular flow voids are preserved. Skull and upper cervical  spine: Unremarkable bone marrow signal. Sinuses/Orbits: Unremarkable orbits. Moderate mucosal thickening in the ethmoid sinuses with milder mucosal thickening in the other paranasal sinuses and minimal fluid in the left sphenoid sinus. Clear mastoid air cells. Other: None. MRA HEAD FINDINGS The intracranial vertebral arteries are widely patent to the basilar. Patent bilateral PICA, right AICA, and bilateral SCA origins are identified. The basilar artery is widely patent. There are right larger than left posterior communicating arteries with absence of the right P1 segment, a normal variant. Both PCAs are patent without evidence of a significant proximal stenosis. The internal carotid arteries are widely patent from skull base to carotid termini. ACAs and MCAs are patent without evidence of a proximal branch occlusion or significant proximal stenosis. No aneurysm is identified. MRA NECK FINDINGS There is a standard 3 vessel aortic arch. The brachiocephalic and subclavian arteries are widely patent. The common carotid and cervical internal carotid arteries are patent and smooth without evidence of stenosis or dissection. The vertebral arteries are patent with antegrade flow bilaterally and with the left being mildly dominant. No significant vertebral artery stenosis or dissection is identified. IMPRESSION: 1. Acute right basal ganglia infarct. 2. Age advanced chronic small vessel ischemic disease. 3. Negative head MRA. 4. Negative neck MRA. Electronically Signed   By: Sebastian Ache M.D.   On: 10/02/2020 08:36   MR Brain W and Wo Contrast  Result Date: 10/02/2020 CLINICAL DATA:  Left-sided weakness, facial droop, and slurred speech. Accelerated hypertension. EXAM: MRI HEAD WITHOUT AND WITH CONTRAST MRA HEAD WITHOUT CONTRAST MRA NECK WITHOUT AND WITH CONTRAST TECHNIQUE: Multiplanar, multiecho pulse sequences of the brain and surrounding structures were obtained without and with intravenous contrast. Angiographic  images of the Circle of Willis were obtained using MRA technique without intravenous contrast. Angiographic images of the neck were obtained using MRA technique without and with intravenous contrast. Carotid stenosis measurements (when applicable) are obtained utilizing NASCET criteria, using the distal internal carotid diameter as the denominator. CONTRAST:  26mL GADAVIST GADOBUTROL 1 MMOL/ML IV SOLN COMPARISON:  Head CT 10/02/2020 and head and neck CTA 10/01/2020 FINDINGS: MRI HEAD FINDINGS Brain: There is an acute right lateral lenticulostriate territory infarct extending from the posterior aspect of the right lentiform nucleus to the caudate body. This is separate from a chronic lacunar infarct which is located more anteriorly in the right basal ganglia. Scattered small foci of T2 hyperintensity in the cerebral white matter bilaterally are moderately advanced for age and nonspecific but compatible with chronic small vessel ischemic disease. No intracranial hemorrhage, mass, midline shift, or extra-axial fluid collection is identified. The ventricles and sulci are normal. No abnormal enhancement is identified. Vascular: Major intracranial vascular flow voids are preserved. Skull and upper cervical spine: Unremarkable bone marrow signal. Sinuses/Orbits: Unremarkable orbits. Moderate mucosal thickening in the ethmoid sinuses with milder mucosal thickening in the other paranasal sinuses and minimal fluid in the left sphenoid sinus. Clear mastoid air cells. Other: None. MRA HEAD FINDINGS The intracranial vertebral arteries are widely patent to the basilar. Patent bilateral PICA, right AICA, and bilateral SCA origins are identified. The basilar artery is widely patent. There are right larger than left posterior communicating arteries with absence of the right P1 segment, a normal variant. Both PCAs are patent without evidence of a significant proximal stenosis. The internal carotid arteries are widely patent from skull  base to carotid termini. ACAs and MCAs are patent without evidence of a proximal branch occlusion or significant proximal stenosis. No aneurysm is identified. MRA NECK FINDINGS There is a standard 3 vessel aortic arch. The brachiocephalic and subclavian arteries are widely patent. The common carotid and cervical internal carotid arteries are patent and smooth without evidence of stenosis or dissection. The vertebral arteries are patent with antegrade flow bilaterally and with the left being mildly dominant. No significant vertebral artery stenosis or dissection is identified. IMPRESSION: 1. Acute right basal ganglia infarct. 2. Age advanced chronic small vessel ischemic disease. 3. Negative head MRA. 4. Negative neck MRA. Electronically Signed   By: Sebastian Ache M.D.   On: 10/02/2020 08:36   VAS [place] TRANSCRANIAL DOPPLER W BUBBLES  Result Date: 10/04/2020  Transcranial Doppler with Bubble Indications: Stroke. Comparison Study: No prior studies. Performing Technologist: Jean Rosenthal RDMS  Examination Guidelines: A complete evaluation includes B-mode imaging, spectral Doppler, color Doppler, and power Doppler as needed of all accessible portions of each vessel. Bilateral testing is considered an integral part of a complete examination. Limited examinations for reoccurring indications may be performed as noted.  Summary: No HITS at rest or during Valsalva. Negative transcranial Doppler Bubble study with no evidence of right to left intracardiac communication.  A vascular evaluation was performed. The left middle cerebral artery was studied. An IV was inserted into the patient's right hand. Verbal informed consent was obtained.  *See table(s) above for TCD measurements and observations.    Preliminary    ECHOCARDIOGRAM COMPLETE  Result Date: 10/02/2020    ECHOCARDIOGRAM REPORT   Patient Name:   BRECCAN LAMPMAN Date of Exam: 10/02/2020 Medical Rec #:  [id number]       Height:       67.0 in Accession #:    [id number]       Weight:       185.0 lb Date of Birth:  [birth year]      BSA:          1.956 m Patient Age:    34 years        BP:  173/118 mmHg Patient Gender: M               HR:           75 bpm. Exam Location:  Inpatient Procedure: 2D Echo, Cardiac Doppler and Color Doppler Indications:    TIA 435.9 / G45.9  History:        Patient has no prior history of Echocardiogram examinations.  Sonographer:    Renella Cunas RDCS Referring Phys: [id number] MICHAEL E NORINS IMPRESSIONS  1. Left ventricular ejection fraction, by estimation, is 55 to 60%. The left ventricle has normal function. The left ventricle has no regional wall motion abnormalities. Left ventricular diastolic parameters were normal.  2. Right ventricular systolic function is normal. The right ventricular size is normal.  3. The mitral valve is normal in structure. No evidence of mitral valve regurgitation. No evidence of mitral stenosis.  4. The aortic valve is normal in structure. Aortic valve regurgitation is not visualized. No aortic stenosis is present.  5. The inferior vena cava is normal in size with greater than 50% respiratory variability, suggesting right atrial pressure of 3 mmHg. FINDINGS  Left Ventricle: Left ventricular ejection fraction, by estimation, is 55 to 60%. The left ventricle has normal function. The left ventricle has no regional wall motion abnormalities. The left ventricular internal cavity size was normal in size. There is  no left ventricular hypertrophy. Left ventricular diastolic parameters were normal. Right Ventricle: The right ventricular size is normal. No increase in right ventricular wall thickness. Right ventricular systolic function is normal. Left Atrium: Left atrial size was normal in size. Right Atrium: Right atrial size was normal in size. Pericardium: There is no evidence of pericardial effusion. Mitral Valve: The mitral valve is normal in structure. No evidence of mitral valve regurgitation. No evidence of mitral valve  stenosis. Tricuspid Valve: The tricuspid valve is normal in structure. Tricuspid valve regurgitation is trivial. No evidence of tricuspid stenosis. Aortic Valve: The aortic valve is normal in structure. Aortic valve regurgitation is not visualized. No aortic stenosis is present. Pulmonic Valve: The pulmonic valve was normal in structure. Pulmonic valve regurgitation is not visualized. No evidence of pulmonic stenosis. Aorta: The aortic root is normal in size and structure. Venous: The inferior vena cava is normal in size with greater than 50% respiratory variability, suggesting right atrial pressure of 3 mmHg. IAS/Shunts: The interatrial septum was not well visualized.  LEFT VENTRICLE PLAX 2D LVIDd:         4.90 cm      Diastology LVIDs:         3.50 cm      LV e' medial:    6.49 cm/s LV PW:         0.80 cm      LV E/e' medial:  10.7 LV IVS:        0.80 cm      LV e' lateral:   8.76 cm/s LVOT diam:     2.30 cm      LV E/e' lateral: 7.9 LV SV:         68 LV SV Index:   35 LVOT Area:     4.15 cm  LV Volumes (MOD) LV vol d, MOD A2C: 122.0 ml LV vol d, MOD A4C: 116.0 ml LV vol s, MOD A2C: 52.0 ml LV vol s, MOD A4C: 55.8 ml LV SV MOD A2C:     70.0 ml LV SV MOD A4C:     116.0 ml LV  SV MOD BP:      66.2 ml RIGHT VENTRICLE RV S prime:     14.60 cm/s TAPSE (M-mode): 3.0 cm LEFT ATRIUM             Index       RIGHT ATRIUM           Index LA diam:        3.50 cm 1.79 cm/m  RA Area:     11.60 cm LA Vol (A2C):   32.7 ml 16.71 ml/m RA Volume:   27.60 ml  14.11 ml/m LA Vol (A4C):   31.8 ml 16.25 ml/m LA Biplane Vol: 35.8 ml 18.30 ml/m  AORTIC VALVE LVOT Vmax:   88.30 cm/s LVOT Vmean:  60.600 cm/s LVOT VTI:    0.164 m  AORTA Ao Root diam: 3.30 cm MITRAL VALVE MV Area (PHT): 3.42 cm    SHUNTS MV Decel Time: 222 msec    Systemic VTI:  0.16 m MV E velocity: 69.60 cm/s  Systemic Diam: 2.30 cm MV A velocity: 69.20 cm/s MV E/A ratio:  1.01 Charlton Haws MD Electronically signed by Charlton Haws MD Signature Date/Time:  10/02/2020/11:26:08 AM    Final    ECHO TEE  Result Date: 10/04/2020    TRANSESOPHOGEAL ECHO REPORT   Patient Name:   NICOLUS OSE Date of Exam: 10/04/2020 Medical Rec #:  [id number]       Height:       67.0 in Accession #:    [id number]      Weight:       185.0 lb Date of Birth:  1985/12/28      BSA:          1.956 m Patient Age:    34 years        BP:           172/135 mmHg Patient Gender: M               HR:           118 bpm. Exam Location:  Inpatient Procedure: Transesophageal Echo, Color Doppler and Saline Contrast Bubble Study Indications:     Stroke  History:         Patient has prior history of Echocardiogram examinations, most                  recent 10/02/2020. TIA and Stroke; Risk Factors:Current Smoker                  and Hypertension.  Sonographer:     Sheralyn Boatman RDCS Referring Phys:  [id number] [facility] NICOLE DUKE Diagnosing Phys: Thurmon Fair MD PROCEDURE: After discussion of the risks and benefits of a TEE, an informed consent was obtained from the patient. The transesophogeal probe was passed without difficulty through the esophogus of the patient. Imaged were obtained with the patient in a left lateral decubitus position. Sedation performed by different physician. The patient was monitored while under deep sedation. Anesthestetic sedation was provided intravenously by Anesthesiology: 500mg  of Propofol. Image quality was technically difficult. The patient developed Respiratory depression during the procedure. IMPRESSIONS  1. Left ventricular ejection fraction, by estimation, is 65 to 70%. The left ventricle has hyperdynamic function. The left ventricle has no regional wall motion abnormalities. There is mild concentric left ventricular hypertrophy. Left ventricular diastolic function could not be evaluated.  2. Right ventricular systolic function is normal. The right ventricular size is normal.  3. No left atrial/left atrial appendage thrombus was  detected.  4. The mitral valve is normal in  structure. No evidence of mitral valve regurgitation.  5. The aortic valve is normal in structure. Aortic valve regurgitation is not visualized. No aortic stenosis is present.  6. There is delayed arrival of saline contrast in the left atrium (after 8 beats at 144 bpm). The saline contrast is not seen crossing a defect in the atrial septum and most likely represents pulmonary recirculation. FINDINGS  Left Ventricle: Left ventricular ejection fraction, by estimation, is 65 to 70%. The left ventricle has hyperdynamic function. The left ventricle has no regional wall motion abnormalities. The left ventricular internal cavity size was normal in size. There is mild concentric left ventricular hypertrophy. Left ventricular diastolic function could not be evaluated. Right Ventricle: The right ventricular size is normal. No increase in right ventricular wall thickness. Right ventricular systolic function is normal. Left Atrium: Left atrial size was normal in size. No left atrial/left atrial appendage thrombus was detected. Right Atrium: Right atrial size was normal in size. Pericardium: There is no evidence of pericardial effusion. Mitral Valve: The mitral valve is normal in structure. No evidence of mitral valve regurgitation. Tricuspid Valve: The tricuspid valve is normal in structure. Tricuspid valve regurgitation is not demonstrated. Aortic Valve: The aortic valve is normal in structure. Aortic valve regurgitation is not visualized. No aortic stenosis is present. Pulmonic Valve: The pulmonic valve was normal in structure. Pulmonic valve regurgitation is trivial. Aorta: The aortic root, ascending aorta, aortic arch and descending aorta are all structurally normal, with no evidence of dilitation or obstruction. IAS/Shunts: No atrial level shunt detected by color flow Doppler. Agitated saline contrast was given intravenously to evaluate for intracardiac shunting. There is delayed arrival of saline contrast in the left  atrium (after 8 beats at 144 bpm). The saline contrast is not seen crossing a defect in the atrial septum and most likely represents pulmonary recirculation. Thurmon Fair MD Electronically signed by Thurmon Fair MD Signature Date/Time: 10/04/2020/3:55:15 PM    Final    CT HEAD CODE STROKE WO CONTRAST`  Result Date: 10/02/2020 CLINICAL DATA:  Code stroke. 35 year old male with facial droop upon waking. EXAM: CT HEAD WITHOUT CONTRAST TECHNIQUE: Contiguous axial images were obtained from the base of the skull through the vertex without intravenous contrast. COMPARISON:  CTA head and neck, head CT 2145 hours last night. FINDINGS: Brain: Heterogeneity of deep cerebral white matter again noted compatible with age advanced chronic small vessel disease. Stable hypodensity in the anterior limb right internal capsule. Gray-white matter differentiation appears stable since 20/1 45 hours. No midline shift, ventriculomegaly, mass effect, evidence of mass lesion, intracranial hemorrhage or evidence of cortically based acute infarction. Vascular: Trace residual intravascular contrast. No suspicious vessel finding Skull: No acute osseous abnormality identified. Sinuses/Orbits: Stable scattered sinus mucosal thickening and bubbly opacity. Tympanic cavities and mastoids are clear. Other: No acute orbit or scalp soft tissue finding. ASPECTS [facility] Stroke Program Early CT Score) Total score (0-10 with 10 being normal): 10 IMPRESSION: 1. Stable non contrast CT appearance of the brain since 2145 hours last night. Suspect age advanced small vessel disease. 2. ASPECTS 10. Electronically Signed   By: Odessa Fleming M.D.   On: 10/02/2020 06:20   VAS [place] LOWER EXTREMITY VENOUS (DVT)  Result Date: 10/03/2020  Lower Venous DVT Study Indications: Embolic stroke.  Comparison Study: No prior studies. Performing Technologist: Jean Rosenthal RDMS  Examination Guidelines: A complete evaluation includes B-mode imaging, spectral Doppler, color  Doppler, and power Doppler  as needed of all accessible portions of each vessel. Bilateral testing is considered an integral part of a complete examination. Limited examinations for reoccurring indications may be performed as noted. The reflux portion of the exam is performed with the patient in reverse Trendelenburg.  +---------+---------------+---------+-----------+----------+--------------+ RIGHT    CompressibilityPhasicitySpontaneityPropertiesThrombus Aging +---------+---------------+---------+-----------+----------+--------------+ CFV      Full           Yes      Yes                                 +---------+---------------+---------+-----------+----------+--------------+ SFJ      Full                                                        +---------+---------------+---------+-----------+----------+--------------+ FV Prox  Full                                                        +---------+---------------+---------+-----------+----------+--------------+ FV Mid   Full                                                        +---------+---------------+---------+-----------+----------+--------------+ FV DistalFull                                                        +---------+---------------+---------+-----------+----------+--------------+ PFV      Full                                                        +---------+---------------+---------+-----------+----------+--------------+ POP      Full           Yes      Yes                                 +---------+---------------+---------+-----------+----------+--------------+ PTV      Full                                                        +---------+---------------+---------+-----------+----------+--------------+ PERO     Full                                                        +---------+---------------+---------+-----------+----------+--------------+    +---------+---------------+---------+-----------+----------+--------------+  LEFT     CompressibilityPhasicitySpontaneityPropertiesThrombus Aging +---------+---------------+---------+-----------+----------+--------------+ CFV      Full           Yes      Yes                                 +---------+---------------+---------+-----------+----------+--------------+ SFJ      Full                                                        +---------+---------------+---------+-----------+----------+--------------+ FV Prox  Full                                                        +---------+---------------+---------+-----------+----------+--------------+ FV Mid   Full                                                        +---------+---------------+---------+-----------+----------+--------------+ FV DistalFull                                                        +---------+---------------+---------+-----------+----------+--------------+ PFV      Full                                                        +---------+---------------+---------+-----------+----------+--------------+ POP      Full           Yes      Yes                                 +---------+---------------+---------+-----------+----------+--------------+ PTV      Full                                                        +---------+---------------+---------+-----------+----------+--------------+ PERO     Full                                                        +---------+---------------+---------+-----------+----------+--------------+     Summary: RIGHT: - There is no evidence of deep vein thrombosis in the lower extremity.  - No cystic structure found in the popliteal fossa.  LEFT: - There is no evidence of deep vein thrombosis in the lower extremity.  - No cystic  structure found in the popliteal fossa.  *See table(s) above for measurements and observations. Electronically signed  by Lemar Livings MD on 10/03/2020 at 4:20:07 PM.    Final     Microbiology: Recent Results (from the past 240 hour(s))  Resp Panel by RT-PCR (Flu A&B, Covid) Nasopharyngeal Swab     Status: None   Collection Time: 10/01/20  9:20 PM   Specimen: Nasopharyngeal Swab; Nasopharyngeal(NP) swabs in vial transport medium  Result Value Ref Range Status   SARS Coronavirus 2 by RT PCR NEGATIVE NEGATIVE Final    Comment: (NOTE) SARS-CoV-2 target nucleic acids are NOT DETECTED.  The SARS-CoV-2 RNA is generally detectable in upper respiratory specimens during the acute phase of infection. The lowest concentration of SARS-CoV-2 viral copies this assay can detect is 138 copies/mL. A negative result does not preclude SARS-Cov-2 infection and should not be used as the sole basis for treatment or other patient management decisions. A negative result may occur with  improper specimen collection/handling, submission of specimen other than nasopharyngeal swab, presence of viral mutation(s) within the areas targeted by this assay, and inadequate number of viral copies(<138 copies/mL). A negative result must be combined with clinical observations, patient history, and epidemiological information. The expected result is Negative.  Fact Sheet for Patients:  BloggerCourse.com  Fact Sheet for Healthcare Providers:  SeriousBroker.it  This test is no t yet approved or cleared by the [place] FDA and  has been authorized for detection and/or diagnosis of SARS-CoV-2 by FDA under an Emergency Use Authorization (EUA). This EUA will remain  in effect (meaning this test can be used) for the duration of the COVID-19 declaration under Section 564(b)(1) of the Act, 21 U.S.C.section 360bbb-3(b)(1), unless the authorization is terminated  or revoked sooner.       Influenza A by PCR NEGATIVE NEGATIVE Final   Influenza B by PCR NEGATIVE NEGATIVE Final    Comment:  (NOTE) The Xpert Xpress SARS-CoV-2/FLU/RSV plus assay is intended as an aid in the diagnosis of influenza from Nasopharyngeal swab specimens and should not be used as a sole basis for treatment. Nasal washings and aspirates are unacceptable for Xpert Xpress SARS-CoV-2/FLU/RSV testing.  Fact Sheet for Patients: BloggerCourse.com  Fact Sheet for Healthcare Providers: SeriousBroker.it  This test is not yet approved or cleared by the [place] FDA and has been authorized for detection and/or diagnosis of SARS-CoV-2 by FDA under an Emergency Use Authorization (EUA). This EUA will remain in effect (meaning this test can be used) for the duration of the COVID-19 declaration under Section 564(b)(1) of the Act, 21 U.S.C. section 360bbb-3(b)(1), unless the authorization is terminated or revoked.  Performed at [facility], [address], [place], [place] [address]      Labs: CBC: Recent Labs  Lab 10/01/20 2120 10/01/20 2131  WBC 9.6  --   NEUTROABS 4.6  --   HGB 15.0 16.3  HCT 44.8 48.0  MCV 83.9  --   PLT 203  --    Basic Metabolic Panel: Recent Labs  Lab 10/01/20 2120 10/01/20 2131 10/02/20 0310 10/03/20 0318  NA 142 142 138 138  K 3.4* 3.4* 3.2* 3.3*  CL 106 104 105 105  CO2 26  --  24 24  GLUCOSE 81 78 119* 101*  BUN 11 11 9 10   CREATININE 1.22 1.20 1.11 1.29*  CALCIUM 9.4  --  8.8* 8.6*  MG  --   --   --  2.2   Liver Function Tests:  Recent Labs  Lab 10/01/20 2120  AST 20  ALT 15  ALKPHOS 77  BILITOT 1.0  PROT 7.7  ALBUMIN 4.3   CBG: No results for input(s): GLUCAP in the last 168 hours.  Time spent: 35 minutes  Signed:  Lynden Oxford  [facility] 10/04/2020 6:04 PM

## 2020-10-06 ENCOUNTER — Encounter (HOSPITAL_COMMUNITY): Payer: Self-pay | Admitting: Cardiovascular Disease

## 2020-10-07 LAB — PROTHROMBIN GENE MUTATION

## 2020-10-07 LAB — FACTOR 5 LEIDEN

## 2020-10-08 LAB — ANTINUCLEAR ANTIBODIES, IFA: ANA Ab, IFA: NEGATIVE

## 2020-10-08 LAB — SICKLE CELL SCREEN: Sickle Cell Screen: NEGATIVE

## 2020-10-08 NOTE — Anesthesia Postprocedure Evaluation (Signed)
Anesthesia Post Note  Patient: William Cantu  Procedure(s) Performed: TRANSESOPHAGEAL ECHOCARDIOGRAM (TEE) (N/A ) BUBBLE STUDY     Patient location during evaluation: PACU Anesthesia Type: MAC Level of consciousness: awake and alert Pain management: pain level controlled Vital Signs Assessment: post-procedure vital signs reviewed and stable Respiratory status: spontaneous breathing Cardiovascular status: stable Anesthetic complications: no   No complications documented.  Last Vitals:  Vitals:   10/04/20 1540 10/04/20 1557  BP: (!) 157/104   Pulse: (!) 111 100  Resp: 20 18  Temp: 36.9 C   SpO2: 100%     Last Pain:  Vitals:   10/04/20 1540  TempSrc: Oral  PainSc:                  Nolon Nations

## 2020-10-17 ENCOUNTER — Inpatient Hospital Stay (INDEPENDENT_AMBULATORY_CARE_PROVIDER_SITE_OTHER): Payer: Self-pay | Admitting: Primary Care

## 2020-10-17 ENCOUNTER — Telehealth (HOSPITAL_COMMUNITY): Payer: Self-pay

## 2020-10-17 NOTE — Telephone Encounter (Signed)
Transitions of Care Pharmacy   Call attempted for a pharmacy transitions of care follow-up. HIPAA appropriate voicemail was left with call back information provided.   Call attempt #3. Will no longer attempt follow up for pharmacy TOC.

## 2020-11-04 ENCOUNTER — Ambulatory Visit: Payer: Self-pay

## 2020-11-04 NOTE — Telephone Encounter (Signed)
New BP med after CVA- having hives with bumps some are fluid filled  Size of pencil head with clusters dime sized with erythema.  Took Benadryl for itching with mild improvement. Rah is generalized but worse on arms, truck, chest and back. Few on legs, neck and a couple on face. Last dose Benadryl yesterday 1200 (50 mg) with mild effect.  approx. 1 week after starting new meds.  Care advice given and pt verbalized understanding. Advised pt to call 911 if he begins having any itching or swelling to mouth, tongue or face, or difficulty breathing.  Pt has an existing appt on Wednesday for hospital f/u. Disposition advised appt within 24 hours. No appts available. Will forward to office to see if can be worked in. Added appt to wait list as well. Pt prefers appt tomorrow.  Reason for Disposition . Taking new prescription medicine  (Exceptions: finished taking new prescription antibiotic OR questions about flushing from niacin)  Answer Assessment - Initial Assessment Questions 1. APPEARANCE of RASH: "Describe the rash." (e.g., spots, blisters, raised areas, skin peeling, scaly)     Spots with fluid filled- whelps and redness around them   2. SIZE: "How big are the spots?" (e.g., tip of pen, eraser, coin; inches, centimeters)     Tip of pen 3. LOCATION: "Where is the rash located?"     Trunk, chest arms, legs neck 4. COLOR: "What color is the rash?" (Note: It is difficult to assess rash color in people with darker-colored skin. When this situation occurs, simply ask the caller to describe what they see.)     Red peri bumps the larger one look like a scab 5. ONSET: "When did the rash begin?"     3 weeks ago 6. FEVER: "Do you have a fever?" If Yes, ask: "What is your temperature, how was it measured, and when did it start?"     no 7. ITCHING: "Does the rash itch?" If Yes, ask: "How bad is the itch?" (Scale 1-10; or mild, moderate, severe)     Yes- 6-7/10 8. CAUSE: "What do you think is causing the  rash?"     Med reaction to BP med 9. NEW MEDICATION: "What new medication are you taking?" (e.g., name of antibiotic) "When did you start taking this medication?".     Clopidogrel, atorvastatin, 81 mg aspirin, amlodipine 10. OTHER SYMPTOMS: "Do you have any other symptoms?" (e.g., sore throat, fever, joint pain)      no  Protocols used: RASH - WIDESPREAD ON DRUGS-A-AH

## 2020-11-05 ENCOUNTER — Encounter: Payer: Self-pay | Admitting: Adult Health

## 2020-11-05 ENCOUNTER — Inpatient Hospital Stay: Payer: Self-pay | Admitting: Adult Health

## 2020-11-06 ENCOUNTER — Ambulatory Visit (INDEPENDENT_AMBULATORY_CARE_PROVIDER_SITE_OTHER): Payer: Self-pay | Admitting: Primary Care

## 2020-11-06 ENCOUNTER — Other Ambulatory Visit: Payer: Self-pay

## 2020-11-06 VITALS — BP 164/128 | HR 71 | Temp 97.9°F | Ht 67.0 in | Wt 201.0 lb

## 2020-11-06 DIAGNOSIS — I63311 Cerebral infarction due to thrombosis of right middle cerebral artery: Secondary | ICD-10-CM

## 2020-11-06 DIAGNOSIS — I10 Essential (primary) hypertension: Secondary | ICD-10-CM

## 2020-11-06 DIAGNOSIS — G459 Transient cerebral ischemic attack, unspecified: Secondary | ICD-10-CM

## 2020-11-06 DIAGNOSIS — Z720 Tobacco use: Secondary | ICD-10-CM

## 2020-11-06 DIAGNOSIS — Z7689 Persons encountering health services in other specified circumstances: Secondary | ICD-10-CM

## 2020-11-06 DIAGNOSIS — Z09 Encounter for follow-up examination after completed treatment for conditions other than malignant neoplasm: Secondary | ICD-10-CM

## 2020-11-06 MED ORDER — CLONIDINE HCL 0.1 MG PO TABS
0.2000 mg | ORAL_TABLET | Freq: Once | ORAL | Status: AC
  Administered 2020-11-06: 0.2 mg via ORAL

## 2020-11-06 NOTE — Progress Notes (Signed)
New Patient Office Visit  Subjective:  Patient ID: Maveryck Bahri, male    DOB: 01-17-1986  Age: 35 y.o. MRN: [id number]  CC:  Chief Complaint  Patient presents with  . Hospitalization Follow-up    Stroke     HPI  Mr. Ryo Klang 35 y.o.male presents for follow up from the hospital. Admit date to the hospital was 10/01/20, patient was discharged from the hospital on 10/04/20, patient was admitted for: TIA (transient ischemic attack), Accelerated hypertension and CVA (cerebral vascular accident) (HCC)     No past medical history on file.  Past Surgical History:  Procedure Laterality Date  . BUBBLE STUDY  10/04/2020   Procedure: BUBBLE STUDY;  Surgeon: Thurmon Fair, MD;  Location: [facility] ENDOSCOPY;  Service: Cardiovascular;;  . TEE WITHOUT CARDIOVERSION N/A 10/04/2020   Procedure: TRANSESOPHAGEAL ECHOCARDIOGRAM (TEE);  Surgeon: Thurmon Fair, MD;  Location: [facility] ENDOSCOPY;  Service: Cardiovascular;  Laterality: N/A;    Family History  Problem Relation Age of Onset  . Hypertension Father     Social History   Socioeconomic History  . Marital status: Single    Spouse name: Not on file  . Number of children: Not on file  . Years of education: Not on file  . Highest education level: Not on file  Occupational History  . Not on file  Tobacco Use  . Smoking status: Current Every Day Smoker    Packs/day: 0.50  . Smokeless tobacco: Never Used  Substance and Sexual Activity  . Alcohol use: No  . Drug use: No  . Sexual activity: Not on file  Other Topics Concern  . Not on file  Social History Narrative  . Not on file   Social Determinants of Health   Financial Resource Strain: Not on file  Food Insecurity: Not on file  Transportation Needs: Not on file  Physical Activity: Not on file  Stress: Not on file  Social Connections: Not on file  Intimate Partner Violence: Not on file    ROS Review of Systems  Cardiovascular: Positive for palpitations.  Neurological:  Positive for headaches.  All other systems reviewed and are negative.   Objective:   Today's Vitals: BP (!) 166/124 (BP Location: Right Arm, Patient Position: Sitting, Cuff Size: Normal)   Pulse 84   Temp 97.9 F (36.6 C) (Temporal)   Ht 5\' 7"  (1.702 m)   Wt 201 lb (91.2 kg)   SpO2 96%   BMI 31.48 kg/m   Physical Exam Filed Weights   11/06/20 1017  Weight: 201 lb (91.2 kg)   BP (!) 166/124 (BP Location: Right Arm, Patient Position: Sitting, Cuff Size: Normal)   Pulse 84   Temp 97.9 F (36.6 C) (Temporal)   Ht 5\' 7"  (1.702 m)   Wt 201 lb (91.2 kg)   SpO2 96%   BMI 31.48 kg/m  General Appearance: Well nourished, in no apparent distress. Eyes: PERRLA, EOMs, conjunctiva no swelling or erythema Sinuses: No Frontal/maxillary tenderness ENT/Mouth: Ext aud canals clear, TMs without erythema, bulging. No erythema, swelling, or exudate on post pharynx.  Tonsils not swollen or erythematous. Hearing normal.  Neck: Supple, thyroid normal.  Respiratory: Respiratory effort normal, BS equal bilaterally without rales, rhonchi, wheezing or stridor.  Cardio: RRR with no MRGs. Brisk peripheral pulses without edema.  Abdomen: Soft, + BS.  Non tender, no guarding, rebound, hernias, masses. Lymphatics: Non tender without lymphadenopathy.  Musculoskeletal: Full ROM, 5/5 strength, normal gait.  Skin: Warm, dry without rashes, lesions, ecchymosis.  Neuro:  Cranial nerves intact. Normal muscle tone, no cerebellar symptoms. Sensation intact.  Psych: Awake and oriented X 3, normal affect, Insight and Judgment appropriate.   Assessment & Plan:   [place] was seen today for hospitalization follow-up.  Diagnoses and all orders for this visit:  Essential hypertension Patient stopped all medications due to petechiae which she felt was a rash interaction from the medications he started to take.  Discussed risk factors for another event of a CVA with elevated blood pressure and smoking.  Blood pressure  elevated at this visit consulted with a cardiologist.  Explained to patient would call back with new medication regiment.  Call patient several times left message to call the office.Counseled on blood pressure goal of less than 130/80, low-sodium, DASH diet, medication compliance, 150 minutes of moderate intensity exercise per week. Discussed medication compliance, adverse effects. -     cloNIDine (CATAPRES) tablet 0.2 mg -     amLODipine (NORVASC) 10 MG tablet; Take 1 tablet (10 mg total) by mouth daily. -     Ambulatory referral to Cardiology -     losartan-hydrochlorothiazide (HYZAAR) 100-25 MG tablet; Take 1 tablet by mouth daily. -     carvedilol (COREG) 12.5 MG tablet; Take 1 tablet (12.5 mg total) by mouth 2 (two) times daily with a meal.  TIA (transient ischemic attack) Petechiae more than likely was a result from aspirin and Plavix and not an allergic reaction.  Needs to continue aspirin and follow-up as noted on discharge papers with a cardiologist referral made -     Ambulatory referral to Neurology  Cerebrovascular accident (CVA) due to thrombosis of right middle cerebral artery (HCC) -     Ambulatory referral to Neurology  Encounter to establish care Establishing care with new provider  Hospital discharge follow-up  Admit date to the hospital was 10/01/20, patient was discharged from the hospital on 10/04/20, patient was admitted for: Accelerated hypertension, TIA and CVA.  Discharge note indicates to follow-up with vascular and echo with cardiology and neurology.  Referrals may patient is aware   Tobacco abuse Patient is made aware increased risk of tobacco abuse hypertension sedentary job increased risk for stroke,(previously had TIA and CVA) heart attack, respiratory complications and lung cancer.  Each visit will discuss smoking cessation  Grayce Sessions, NP 11:07 AM

## 2020-11-06 NOTE — Progress Notes (Signed)
Pt is having an allergic reaction to one of the medications but is unsure of which medication

## 2020-11-08 ENCOUNTER — Telehealth (INDEPENDENT_AMBULATORY_CARE_PROVIDER_SITE_OTHER): Payer: Self-pay

## 2020-11-08 NOTE — Telephone Encounter (Signed)
Pt calling again and is requesting to speak with PCP. He states that he is concerned about having a stroke from not taking medication. Please advise.

## 2020-11-08 NOTE — Telephone Encounter (Signed)
Copied from CRM [id number]. Topic: General - Other >> Nov 07, 2020 11:05 AM William Cantu wrote: Patient states PCP advised him to call back today to discuss alternated medication due to allergic reaction to placlopidogrel (PLAVIX). Patient would like a follow up today. Please update patient PCP to reflect PCP.

## 2020-11-08 NOTE — Telephone Encounter (Signed)
Please contact patient upon return he is very concerned about BP and wanting to know what medications he should be taking.

## 2020-11-12 ENCOUNTER — Encounter (INDEPENDENT_AMBULATORY_CARE_PROVIDER_SITE_OTHER): Payer: Self-pay | Admitting: Primary Care

## 2020-11-12 ENCOUNTER — Other Ambulatory Visit: Payer: Self-pay | Admitting: Primary Care

## 2020-11-12 MED ORDER — AMLODIPINE BESYLATE 10 MG PO TABS: 10.0000 mg | ORAL_TABLET | Freq: Every day | ORAL | 3 refills | Status: DC

## 2020-11-12 MED ORDER — CARVEDILOL 12.5 MG PO TABS: 12.5000 mg | ORAL_TABLET | Freq: Two times a day (BID) | ORAL | 3 refills | Status: DC

## 2020-11-12 MED ORDER — LOSARTAN POTASSIUM-HCTZ 100-25 MG PO TABS: 1.0000 | ORAL_TABLET | Freq: Every day | ORAL | 3 refills | Status: DC

## 2020-11-13 MED FILL — CARVEDILOL 12.5 MG TABLET: 12.5 | 30 days supply | Qty: 60 | Fill #0

## 2020-11-13 MED FILL — LOSARTAN-HCTZ 100-25 MG TAB: 100-25 | 30 days supply | Qty: 30 | Fill #0

## 2020-11-13 MED FILL — AMLODIPINE BESYLATE 10 MG T: 10 | 30 days supply | Qty: 30 | Fill #0

## 2020-11-14 NOTE — Telephone Encounter (Signed)
Patient is returning a call to the doctor regarding his medication.  He stated he needs to talk to the nurse or doctor to find out which medication he should be taking.  Please call patient back at [phone]

## 2020-11-14 NOTE — Telephone Encounter (Signed)
Called patient spoke with his mother she will have patient to return call

## 2020-11-18 ENCOUNTER — Ambulatory Visit (INDEPENDENT_AMBULATORY_CARE_PROVIDER_SITE_OTHER): Payer: Self-pay | Admitting: *Deleted

## 2020-11-18 NOTE — Telephone Encounter (Signed)
Pt reports BP yesterday 177/123. States is at work now and has not checked BP today. Denies any associated symptoms:no headache, no dizziness, no weakness or visual changes. Pt states unsure regarding what meds he hould be taking. Reviewed meds from Appt of 11/06/20.  Made aware they had been called in to pharmacy. Pt states he was not aware and will pick up now. Care advise given; advised to check BP when he gets home and to continue to monitor. Reviewed symptoms that warrant ED eval. Pt verbalizes understanding.   Reason for Disposition . Systolic BP  >= 160 OR Diastolic >= 100  Answer Assessment - Initial Assessment Questions 1. BLOOD PRESSURE: "What is the blood pressure?" "Did you take at least two measurements 5 minutes apart?"     177/123 yesterday. Home monitor, arm cuff 2. ONSET: "When did you take your blood pressure?"     Yesterday 3. HOW: "How did you obtain the blood pressure?" (e.g., visiting nurse, automatic home BP monitor)     home 4. HISTORY: "Do you have a history of high blood pressure?"     *yes 5. MEDICATIONS: "Are you taking any medications for blood pressure?" "Have you missed any doses recently?"     Only Norvasc. Last took today 6. OTHER SYMPTOMS: "Do you have any symptoms?" (e.g., headache, chest pain, blurred vision, difficulty breathing, weakness)    no  Protocols used: BLOOD PRESSURE - HIGH-A-AH

## 2020-11-19 ENCOUNTER — Ambulatory Visit: Payer: Self-pay | Admitting: Adult Health

## 2020-11-19 ENCOUNTER — Encounter: Payer: Self-pay | Admitting: Adult Health

## 2020-11-19 VITALS — BP 170/124 | HR 85 | Ht 67.0 in | Wt 199.0 lb

## 2020-11-19 DIAGNOSIS — L270 Generalized skin eruption due to drugs and medicaments taken internally: Secondary | ICD-10-CM

## 2020-11-19 DIAGNOSIS — I6381 Other cerebral infarction due to occlusion or stenosis of small artery: Secondary | ICD-10-CM

## 2020-11-19 NOTE — Progress Notes (Signed)
I agree with the above plan 

## 2020-11-19 NOTE — Patient Instructions (Addendum)
Ensure follow-up with cardiology as scheduled on 4/8 to further discuss uncontrolled blood pressure  Ensure you pick up blood pressure medications -amlodipine 10 mg daily and carvedilol 12.5 mg twice daily - continue this regimen for a few days and if blood pressure remains elevated, then start Hyzaar   In regards to your drug rash, use of Zyrtec daily until rash improves and try use of hydrocortisone cream or Benadryl cream to help sooth itching. Can also try Aveeno oatmeal bath for potential benefit  Continue aspirin 81 mg daily  and atorvastatin 40 mg daily for secondary stroke prevention  Continue to follow up with PCP regarding cholesterol and blood pressure management  Maintain strict control of hypertension with blood pressure goal below 130/90 and cholesterol with LDL cholesterol (bad cholesterol) goal below 70 mg/dL.       Followup in the future with me in 6 months or call earlier if needed       Thank you for coming to see [place] at [facility]. I hope we have been able to provide you high quality care today.  You may receive a patient satisfaction survey over the next few weeks. We would appreciate your feedback and comments so that we may continue to improve ourselves and the health of our patients.

## 2020-11-19 NOTE — Progress Notes (Signed)
[facility] [address] [place]. [place] [address] [phone]       HOSPITAL FOLLOW UP NOTE  [name] Date of Birth:  [birth year] Medical Record Number:  [id number]   Reason for Referral:  hospital stroke follow up    SUBJECTIVE:   CHIEF COMPLAINT:  Chief Complaint  Patient presents with  . Follow-up    RM 14 alone Pt is well, no issues other than BP being high     HPI:   [name] is a 35 y.o. male with no past medical history who presented to [facility] ED on 10/01/2020 with slurred speech, left-sided weakness, left-sided facial droop.  His symptoms resolved in 15 minutes before coming to ED. Patient had another similar episode lasting for 15 minutes. Patient blood pressure elevated at 164/117 mmHg, at admission.  Personally reviewed hospitalization pertinent progress notes, lab work and imaging with summary provided.  Evaluated by Dr. Roda Shutters with stroke work-up revealing acute right BG infarct likely in setting of small vessel disease with uncontrolled HTN and tobacco use.  Recommended DAPT for 3 weeks and aspirin alone.  LDL 104 -initiated atorvastatin 40 mg daily.  Current tobacco use with smoking cessation counseling provided.  HTN stabilize during admission with long-term BP normotensive range.  Other stroke risk factors include substance abuse +THC and prior stroke on imaging.  Discharged home in stable condition without therapy needs.   Stroke - Acute right BG infarct, still more likely small vessel diease due to risk factors of uncontrolled HTN and smoking  CT head- Chronic lacunar infarct at the anterior limb of the right internal capsule. Patchy hypodensity involving the supratentorial cerebral white matter  CTA head & neck -Normal.   MRI  acute right basal ganglia infarct.  Chronic lacunar infarct located more anteriorly in the right basal ganglia.  Chronic small vessel ischemic disease  MRA  Normal  2D Echo - LVEF 55 to 60%.  LE  venous Doppler No DVT  TEE Normal  Transcranial Doppler with bubbles no PFO  Hypercoagulable work up - NEGATIVE  LDL 104  Vitamin B12 = 418  HgbA1c 5.6  VTE prophylaxis - Enoxaparin  No antithrombotic prior to admission, now on aspirin 81 mg daily and clopidogrel 75 mg daily.  Continue DAPT for 3 weeks and then aspirin alone.  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 F/u with PCP recently with medication adjustments but he was not aware of these adjustments and is currently only on amlodipine 5 mg daily.  He reports just being advised yesterday that additional medications were sent to [facility] and he plans on picking these up today.  He was referred to cardiology and has initial evaluation scheduled on 4/8  Tobacco use - 1 black and mild daily  No further concerns at this time  ROS:   14 system review of systems performed and negative with exception of those listed in HPI  PMH: History reviewed. No pertinent past medical history.  PSH:  Past Surgical History:  Procedure Laterality Date  . BUBBLE STUDY  10/04/2020   Procedure: BUBBLE STUDY;  Surgeon: Thurmon Fair, MD;  Location: [facility] ENDOSCOPY;  Service: Cardiovascular;;  . TEE WITHOUT CARDIOVERSION N/A  10/04/2020   Procedure: TRANSESOPHAGEAL ECHOCARDIOGRAM (TEE);  Surgeon: Thurmon Fair, MD;  Location: [facility] ENDOSCOPY;  Service: Cardiovascular;  Laterality: N/A;    Social History:  Social History   Socioeconomic History  . Marital status: Single    Spouse name: Not on file  . Number of children: Not on file  . Years of education: Not on file  . Highest education level: Not on file  Occupational History  . Not on file  Tobacco Use  . Smoking status: Current Every Day Smoker    Packs/day: 0.50  . Smokeless tobacco: Never Used  Substance and Sexual Activity  . Alcohol use: No  . Drug use: No  . Sexual activity: Not on file  Other Topics Concern  . Not on file  Social History Narrative  . Not on file   Social Determinants of Health   Financial Resource Strain: Not on file  Food Insecurity: Not on file  Transportation Needs: Not on file  Physical Activity: Not on file  Stress: Not on file  Social Connections: Not on file  Intimate Partner Violence: Not on file    Family History:  Family History  Problem Relation Age of Onset  . Hypertension Father     Medications:   Current Outpatient Medications on File Prior to Visit  Medication Sig Dispense Refill  . amLODipine (NORVASC) 10 MG tablet Take 1 tablet (10 mg total) by mouth daily. 90 tablet 3  . aspirin EC 81 MG tablet Take 1 tablet (81 mg total) by mouth daily. Swallow whole. 150 tablet 2  . atorvastatin (LIPITOR) 40 MG tablet Take 1 tablet (40 mg total) by mouth daily. 30 tablet 0  . carvedilol (COREG) 12.5 MG tablet Take 1 tablet (12.5 mg total) by mouth 2 (two) times daily with a meal. (Patient not taking: Reported on 11/19/2020) 60 tablet 3  . losartan-hydrochlorothiazide (HYZAAR) 100-25 MG tablet Take 1 tablet by mouth daily. (Patient not taking: Reported on 11/19/2020) 90 tablet 3  . nicotine (NICODERM CQ - DOSED IN MG/24 HR) 7 mg/24hr patch Place 1 patch (7 mg total) onto the skin daily. (Patient not taking:  Reported on 11/19/2020) 30 patch 0   No current facility-administered medications on file prior to visit.    Allergies:   Allergies  Allergen Reactions  . Plavix [Clopidogrel]       OBJECTIVE:  Physical Exam  Vitals:   11/19/20 0959  BP: (!) 170/124  Pulse: 85  Weight: 199 lb (90.3 kg)  Height: 5\' 7"  (1.702 m)   Body mass index is 31.17 kg/m. No exam data present  Post stroke PHQ 2/9 Depression screen PHQ 2/9 11/06/2020  Decreased Interest 0  Down, Depressed, Hopeless 0  PHQ - 2 Score 0     William: well developed, well nourished,  very pleasant middle-aged African-American male, seated, in no evident distress Head: head normocephalic and atraumatic.   Neck: supple with no carotid or supraclavicular bruits Cardiovascular: regular rate and rhythm, no murmurs Musculoskeletal: no deformity Skin:  no rash/petichiae Vascular:  Normal pulses all extremities   Neurologic Exam Mental Status: Awake  and fully alert.   Fluent speech and language.  Oriented to place and time. Recent and remote memory intact. Attention span, concentration and fund of knowledge appropriate. Mood and affect appropriate.  Cranial Nerves: Fundoscopic exam reveals sharp disc margins. Pupils equal, briskly reactive to light. Extraocular movements full without nystagmus. Visual fields full to confrontation. Hearing intact. Facial sensation intact. Face, tongue, palate moves normally and symmetrically.  Motor: Normal bulk and tone. Normal strength in all tested extremity muscles Sensory.: intact to touch , pinprick , position and vibratory sensation.  Coordination: Rapid alternating movements normal in all extremities. Finger-to-nose and heel-to-shin performed accurately bilaterally. Gait and Station: Arises from chair without difficulty. Stance is normal. Gait demonstrates normal stride length and balance with without use of assistive device. Tandem walk and heel toe without difficulty.  Reflexes: 1+ and  symmetric. Toes downgoing.     NIHSS  0 Modified Rankin  0      ASSESSMENT: William Cantu is a 35 y.o. year old male presented with 2x 15 min transient episode of slurred speech, left-sided weakness and left facial droop on 10/01/2020 with stroke work-up revealing acute right BG infarct likely secondary to small vessel disease with uncontrolled HTN and tobacco use. Vascular risk factors include HTN, HLD, prior stroke on imaging, THC use and tobacco use.      PLAN:  1. R BG stroke : Recovered well without residual deficit.  Continue aspirin 81 mg daily  and atorvastatin 40 mg daily for secondary stroke prevention.  Discussed secondary stroke prevention measures and importance of close PCP follow up for aggressive stroke risk factor management  2. HTN: BP goal <130/90.  Currently uncontrolled -advised to increase amlodipine to 10 mg daily and start carvedilol as advised by PCP.  Initial evaluation with cardiology scheduled 4/8. 3. HLD: LDL goal <70. Recent LDL 104 - started on atorvastatin 40 mg daily during recent stroke admission.  Encouraged continued statin use and follow-up with PCP in the next 1 to 2 months for repeat lipid panel 4. Tobacco/THC use: Discussed importance of complete cessation for secondary stroke prevention 5. Drug eruption: Possibly in setting of Plavix use - since d/c'd with improvement of rash.  Advised use of Zyrtec daily and topical cream such as hydrocortisone or Benadryl cream or Aveeno oatmeal bath for symptom management.  Advised to continue to monitor and to follow-up with PCP if symptoms persist 6. At risk for sleep apnea: Referred to [facility] sleep clinic at discharge for possible underlying sleep apnea.  He is currently uninsured and unable to afford financially. Encouraged looking in to [facility] Financial assistance program - he does have paperwork.  He was advised to call if/when he is ready to schedule initial evaluation    Follow up in 6 months or call earlier  if needed   CC:  [facility] provider: Dr. Pearlean Brownie PCP: Grayce Sessions, NP    I spent 45 minutes of face-to-face and non-face-to-face time with patient.  This included previsit chart review including recent hospitalization pertinent progress notes, lab work and imaging, lab review, study review, order entry, electronic health record documentation, patient education regarding recent stroke and etiology, importance of managing stroke risk factors with difficulty controlling blood pressure, drug rash possibly in setting of Plavix use and answered all of other questions to patient satisfaction  Ihor Austin, AGNP-BC  [facility] [address] [place], [place] [address]  Phone [phone] Fax [phone] Note: This document was prepared with digital dictation and possible  smart Company secretary. Any transcriptional errors that result from this process are unintentional.

## 2020-11-28 NOTE — Progress Notes (Signed)
Patient referred by Kerin Perna, NP for hypertension  Subjective:   William Cantu, male    DOB: 04-28-1986, 35 y.o.   MRN: [id number]   Chief Complaint  Patient presents with  . Hypertension  . New Patient (Initial Visit)     HPI  35 y.o. 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  He was found to have a right basal ganglia infarct, likely secondary to small vessel disease with uncontrolled hypertension and tobacco abuse.  He was recommended DAPT for 3 weeks, followed by aspirin alone.  Patient was referred by his PCP for management of hypertension. He has no residual deficit from his stroke.  Patient is a Administrator.  He does not relate physical activity.  He eats on the road most of the times.   He does not limit his salt intake. He admits to not taking medications on time on occasions.  He smokes 1-2 black and mild cigars most days. He has family history of hypertension in his father, but not at his age.   Past Medical History:  Diagnosis Date  . Hypertension      Past Surgical History:  Procedure Laterality Date  . BUBBLE STUDY  10/04/2020   Procedure: BUBBLE STUDY;  Surgeon: Sanda Klein, MD;  Location: [place];  Service: Cardiovascular;;  . TEE WITHOUT CARDIOVERSION N/A 10/04/2020   Procedure: TRANSESOPHAGEAL ECHOCARDIOGRAM (TEE);  Surgeon: Sanda Klein, MD;  Location: [facility] ENDOSCOPY;  Service: Cardiovascular;  Laterality: N/A;     Social History   Tobacco Use  Smoking Status Current Every Day Smoker  . Packs/day: 0.50  . Types: Cigarettes  Smokeless Tobacco Never Used    Social History   Substance and Sexual Activity  Alcohol Use No     Family History  Problem Relation Age of Onset  . Hypertension Father      Current Outpatient Medications on File Prior to Visit  Medication Sig Dispense  Refill  . amLODipine (NORVASC) 10 MG tablet Take 1 tablet (10 mg total) by mouth daily. 90 tablet 3  . aspirin EC 81 MG tablet Take 1 tablet (81 mg total) by mouth daily. Swallow whole. 150 tablet 2  . atorvastatin (LIPITOR) 40 MG tablet Take 1 tablet (40 mg total) by mouth daily. 30 tablet 0  . carvedilol (COREG) 12.5 MG tablet Take 1 tablet (12.5 mg total) by mouth 2 (two) times daily with a meal. 60 tablet 3  . losartan-hydrochlorothiazide (HYZAAR) 100-25 MG tablet Take 1 tablet by mouth daily. 90 tablet 3   No current facility-administered medications on file prior to visit.    Cardiovascular and other pertinent studies:  EKG 11/29/2020: Sinus rhythm 67 bpm Nonspecific T wave inversion   EKG 10/01/2020:   Sinus Rhythm Nonspecific T-abnormlities inferior leads  TEE 10/03/2020:   Mild LVH. Hyperdynamic LV. EF 65-70%. Diastolic fuynciton could not be evaluated. Normal RV size/function. No LA/LAA thrombus No significant valvular abnormalities Delayed arrival of saline contrast in the left atrium (after 8 beats at 144 bpm).  The saline contrast is not seen crossing a defect in the atrial septum and most likely represents pulmonary recirculation.   Vascular Ultrasound 10/03/2020:  No HITS at rest or during Valsalva. Negative transcranial Doppler Bubble study with no evidence of right to left intracardiac communication.  A vascular evaluation was performed. The left middle cerebral artery was studied. An IV was inserted  into the patient's right hand.  Negative TCD Bubble study   CTA head/neck 10/01/2020: 1. Technically limited exam due to a technical error with the scanner during the time of scanning. The upper and mid aspects of the brain are clipped on the CTA portion of this study, with incomplete visualization of the distal arterial vasculature. 2. Negative CTA of the head and neck allowing for technical limitations as above. No large vessel occlusion, hemodynamically significant  stenosis, or other acute vascular abnormality.   Recent labs: 10/03/2020: Glucose 101, BUN/Cr 10/1.29. EGFR >60. Na/K 138/3.3. Rest of the CMP normal H/H 15/44. MCV 83. Platelets 203 HbA1C 5.6% Chol 159, TG 98, HDL 35, LDL 104 TSH 1.5 normal    Review of Systems  Cardiovascular: Negative for chest pain, dyspnea on exertion, leg swelling, palpitations and syncope.         Vitals:   11/29/20 1046 11/29/20 1101  BP: (!) 165/127 (!) 162/114  Pulse: 80 73  Resp: 16   Temp: 98.2 F (36.8 C)   SpO2: 96%      Body mass index is 31.32 kg/m. Filed Weights   11/29/20 1046  Weight: 200 lb (90.7 kg)     Objective:   Physical Exam Vitals and nursing note reviewed.  Constitutional:      General: He is not in acute distress. Neck:     Vascular: No JVD.  Cardiovascular:     Rate and Rhythm: Normal rate and regular rhythm.     Pulses: Normal pulses.     Heart sounds: Normal heart sounds. No murmur heard.   Pulmonary:     Effort: Pulmonary effort is normal.     Breath sounds: Normal breath sounds. No wheezing or rales.  Musculoskeletal:     Right lower leg: No edema.     Left lower leg: No edema.          Assessment & Recommendations:   35 y.o. African-American male with hypertension, right basal ganglia stroke (09/2020)  Hypertension: Uncontrolled.  Compliance is a concern.  He states that he has not taken his medications today.  I reemphasize importance of compliance, especially in the setting of his stroke in 09/2020. Encouraged him to keep a log of his blood pressure readings.  If it remains uncontrolled, will increase carvedilol to 25 mg twice daily. Likely needs work-up for secondary hypertension.  However, currently, he does not have insurance.  I encouraged him to apply for Medicaid.  After he gets insurance, I will order renal artery duplex, renin/aldosterone.  Mixed hyperlipidemia: Refilled Lipitor  Discussed importance of low-salt diet, regular  physical activity, smoking cessation with the patient.  F/u in 3 months  Thank you for referring the patient to [place]. Please feel free to contact with any questions.   Nigel Mormon, MD Pager: [phone] Office: [phone]

## 2020-11-29 ENCOUNTER — Encounter: Payer: Self-pay | Admitting: Cardiology

## 2020-11-29 ENCOUNTER — Other Ambulatory Visit: Payer: Self-pay

## 2020-11-29 ENCOUNTER — Ambulatory Visit: Payer: Self-pay | Admitting: Cardiology

## 2020-11-29 VITALS — BP 162/114 | HR 73 | Temp 98.2°F | Resp 16 | Ht 67.0 in | Wt 200.0 lb

## 2020-11-29 DIAGNOSIS — E782 Mixed hyperlipidemia: Secondary | ICD-10-CM | POA: Insufficient documentation

## 2020-11-29 DIAGNOSIS — I10 Essential (primary) hypertension: Secondary | ICD-10-CM

## 2020-11-29 MED ORDER — ATORVASTATIN CALCIUM 40 MG PO TABS
40.0000 mg | ORAL_TABLET | Freq: Every day | ORAL | 3 refills | Status: DC
  Filled 2020-11-29: qty 30, 30d supply, fill #0

## 2020-12-13 ENCOUNTER — Other Ambulatory Visit: Payer: Self-pay

## 2021-01-27 ENCOUNTER — Ambulatory Visit: Payer: Self-pay | Admitting: Cardiology

## 2021-05-22 ENCOUNTER — Ambulatory Visit: Payer: Self-pay | Admitting: Adult Health

## 2021-05-22 ENCOUNTER — Encounter: Payer: Self-pay | Admitting: Adult Health

## 2021-12-24 ENCOUNTER — Emergency Department (HOSPITAL_COMMUNITY)
Admission: EM | Admit: 2021-12-24 | Discharge: 2021-12-24 | Disposition: A | Discharge status: Home / Self Care | Payer: PRIVATE HEALTH INSURANCE | Attending: Emergency Medicine | Admitting: Emergency Medicine

## 2021-12-24 ENCOUNTER — Emergency Department (HOSPITAL_COMMUNITY): Payer: PRIVATE HEALTH INSURANCE

## 2021-12-24 ENCOUNTER — Other Ambulatory Visit: Payer: Self-pay

## 2021-12-24 ENCOUNTER — Encounter (HOSPITAL_COMMUNITY): Payer: Self-pay

## 2021-12-24 DIAGNOSIS — W230XXA Caught, crushed, jammed, or pinched between moving objects, initial encounter: Secondary | ICD-10-CM | POA: Diagnosis not present

## 2021-12-24 DIAGNOSIS — S62666B Nondisplaced fracture of distal phalanx of right little finger, initial encounter for open fracture: Secondary | ICD-10-CM

## 2021-12-24 DIAGNOSIS — S62667A Nondisplaced fracture of distal phalanx of left little finger, initial encounter for closed fracture: Secondary | ICD-10-CM | POA: Diagnosis not present

## 2021-12-24 DIAGNOSIS — Z23 Encounter for immunization: Secondary | ICD-10-CM | POA: Diagnosis not present

## 2021-12-24 DIAGNOSIS — Y990 Civilian activity done for income or pay: Secondary | ICD-10-CM | POA: Diagnosis not present

## 2021-12-24 DIAGNOSIS — S67197A Crushing injury of left little finger, initial encounter: Secondary | ICD-10-CM | POA: Diagnosis present

## 2021-12-24 DIAGNOSIS — S6710XA Crushing injury of unspecified finger(s), initial encounter: Secondary | ICD-10-CM

## 2021-12-24 IMAGING — DX DG FINGER LITTLE 2+V*L*
3 series · 3 of 3 positions shown · non-contrast
Comparison: None Available.

CLINICAL DATA: Closed finger in truck door today, pain and
laceration

EXAM:
LEFT LITTLE FINGER 2+V

[x finger pa left]
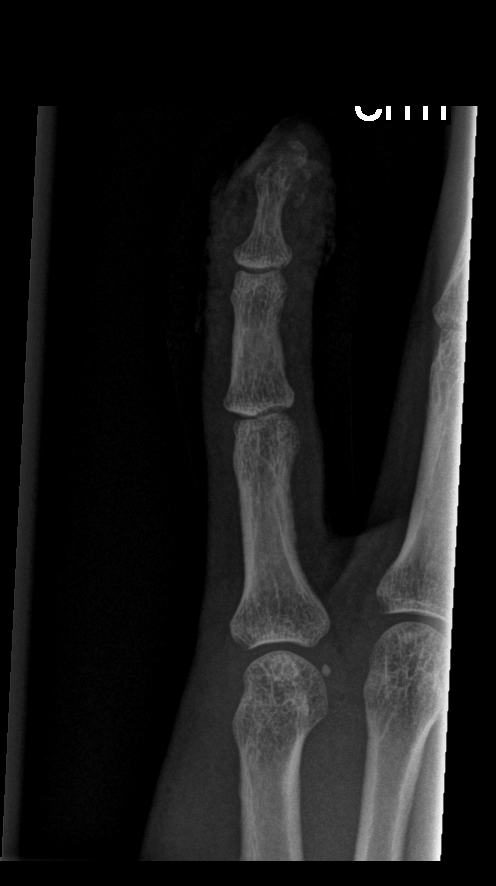

[x finger obl left]
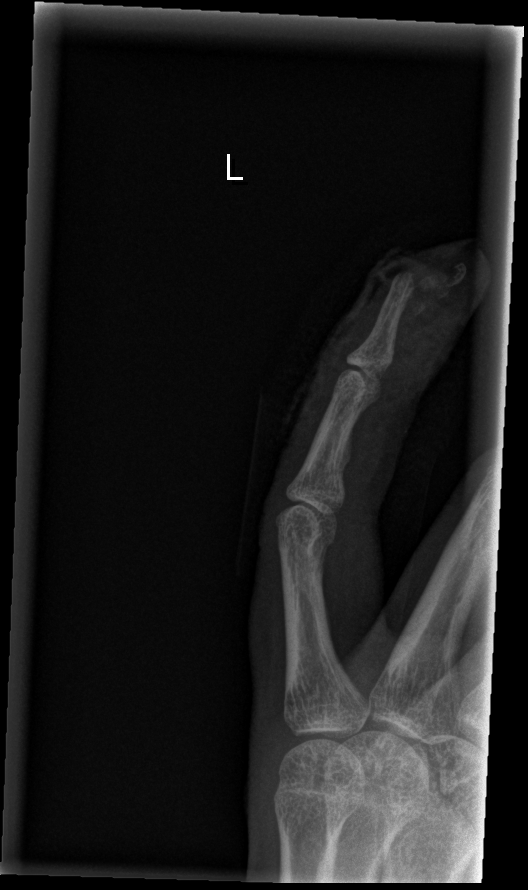

[x finger lat left]
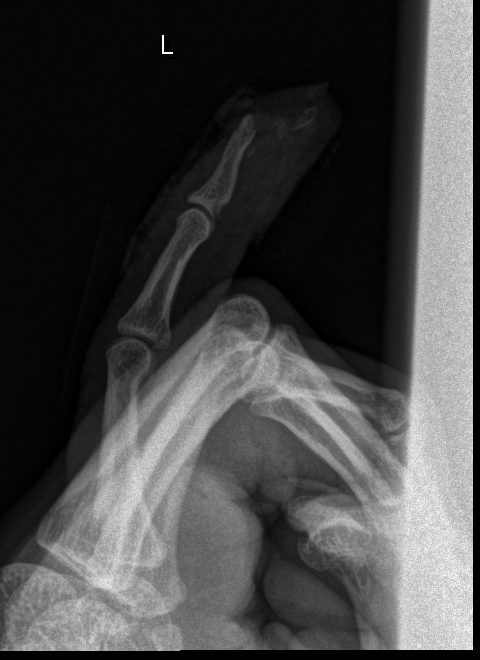

[3 of 3 positions shown; findings below may reference images not displayed]

FINDINGS: Frontal, oblique, lateral views of the left fifth digit are
obtained. There is an open comminuted fracture of the distal tuft of
the fifth distal phalanx, with large soft tissue defect. No
radiopaque foreign bodies. Joint spaces are well preserved.
IMPRESSION: 1. Open comminuted fracture of the distal tuft of the fifth distal
phalanx.

## 2021-12-24 MED ORDER — OXYCODONE HCL 5 MG PO TABS: 5.0000 mg | ORAL_TABLET | Freq: Four times a day (QID) | ORAL | 0 refills | Status: DC | PRN

## 2021-12-24 MED ORDER — TETANUS-DIPHTH-ACELL PERTUSSIS 5-2.5-18.5 LF-MCG/0.5 IM SUSY
0.5000 mL | PREFILLED_SYRINGE | Freq: Once | INTRAMUSCULAR | Status: AC
  Administered 2021-12-24: 0.5 mL via INTRAMUSCULAR
  Filled 2021-12-24: qty 0.5

## 2021-12-24 MED ORDER — STERILE WATER FOR INJECTION IJ SOLN
INTRAMUSCULAR | Status: AC
  Filled 2021-12-24: qty 10

## 2021-12-24 MED ORDER — BUPIVACAINE HCL 0.5 % IJ SOLN
50.0000 mL | Freq: Once | INTRAMUSCULAR | Status: AC
  Administered 2021-12-24: 5 mL
  Filled 2021-12-24: qty 50

## 2021-12-24 MED ORDER — CEFAZOLIN SODIUM 1 G IM
1.0000 g | Freq: Once | INTRAMUSCULAR | Status: AC
  Administered 2021-12-24: 1 g via INTRAMUSCULAR
  Filled 2021-12-24: qty 1000

## 2021-12-24 MED ORDER — CEPHALEXIN 500 MG PO CAPS: 500.0000 mg | ORAL_CAPSULE | Freq: Four times a day (QID) | ORAL | 0 refills | Status: AC

## 2021-12-24 MED ORDER — OXYCODONE HCL 5 MG PO TABS
5.0000 mg | ORAL_TABLET | Freq: Once | ORAL | Status: AC
  Administered 2021-12-24: 5 mg via ORAL
  Filled 2021-12-24: qty 1

## 2021-12-24 MED ORDER — IBUPROFEN 200 MG PO TABS: 600.0000 mg | ORAL_TABLET | Freq: Four times a day (QID) | ORAL | Status: DC

## 2021-12-24 MED ORDER — ACETAMINOPHEN 325 MG PO TABS: 650.0000 mg | ORAL_TABLET | Freq: Four times a day (QID) | ORAL | Status: DC

## 2021-12-24 NOTE — ED Provider Triage Note (Signed)
Emergency Medicine Provider Triage Evaluation Note ? ?William Cantu , a 36 y.o. male  was evaluated in triage.  Pt complains of left little finger pain secondary to finger being closed in door.  Patient accidentally shot his left pinky finger in his truck door.  Bleeding is controlled at this time.  Nail is injured.  Sensation intact but slightly diminished in the distal tip of the left little finger ? ?Review of Systems  ?Positive: Finger injury ?Negative: Loss of consciousness ? ?Physical Exam  ?There were no vitals taken for this visit. ?Gen:   Awake, no distress   ?Resp:  Normal effort  ?MSK:   Moves extremities without difficulty, sensation slightly diminished and little finger of left hand at distal tip, nail partially removed ?Other:   ? ?Medical Decision Making  ?Medically screening exam initiated at 2:59 PM.  Appropriate orders placed.  Tagen Crean was informed that the remainder of the evaluation will be completed by another provider, this initial triage assessment does not replace that evaluation, and the importance of remaining in the ED until their evaluation is complete. ? ? ?  ?Darrick Grinder, PA-C ?12/24/21 1500 ? ?

## 2021-12-24 NOTE — Discharge Instructions (Signed)
Discharge Instructions ? ? ?You have a dressing with a tongue depressor splint incorporated in it. ?Move your fingers as much as possible, like I demonstrated for you. ?Elevate your hand to reduce pain & swelling of the digits.  Ice over the operative site may be helpful to reduce pain & swelling.  DO NOT USE HEAT. ?Use Tylenol/ibuprofen regularly, supplementing with oxycodone as may be needed. ?Leave the dressing in place until you return to our office.  ?You may shower, but keep the bandage clean & dry.  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 ?Our office will call you to arrange follow-up ? ? ?Please call [phone] during normal business hours or [phone] after hours for any problems. Including the following: ? ?- excessive redness of the incisions ?- drainage for more than 4 days ?- fever of more than 101.5 F ? ?*Please note that pain medications will not be refilled after hours or on weekends.  ?

## 2021-12-24 NOTE — ED Triage Notes (Signed)
Pt states he was at work and he slammed his left 5th finger in the door. Pt has finger wrapped. ?

## 2021-12-24 NOTE — ED Provider Notes (Signed)
?[facility] EMERGENCY DEPARTMENT ?Provider Note ? ? ?CSN: [id number] ?Arrival date & time: 12/24/21  1445 ? ?  ? ?History ? ?Chief Complaint  ?Patient presents with  ? Finger Injury  ? ? ?William Cantu is a 36 y.o. male presenting with a left pinky injury.  Patient closed his hand in a car door while at work.  Unknown last tetanus.  Says he had immediate pain and that his nail has almost fallen off.  No numbness or tingling, reports sensation is intact but he cannot move the end of his finger.  Is left-handed. ? ?Last had a meal at 12:30 PM.  No known drug allergies. ? ?HPI ? ?  ? ?Home Medications ?Prior to Admission medications   ?Medication Sig Start Date End Date Taking? Authorizing Provider  ?amLODipine (NORVASC) 10 MG tablet Take 1 tablet (10 mg total) by mouth daily. 11/12/20   Grayce Sessions, NP  ?amLODipine (NORVASC) 10 MG tablet TAKE 1 TABLET (10 MG TOTAL) BY MOUTH DAILY. 11/12/20 11/12/21  Grayce Sessions, NP  ?amLODipine (NORVASC) 5 MG tablet TAKE 1 TABLET (5 MG TOTAL) BY MOUTH DAILY. 10/04/20 10/04/21  Rolly Salter, MD  ?atorvastatin (LIPITOR) 40 MG tablet Take 1 tablet (40 mg total) by mouth daily. 11/29/20   Patwardhan, Anabel Bene, MD  ?atorvastatin (LIPITOR) 40 MG tablet TAKE 1 TABLET (40 MG TOTAL) BY MOUTH DAILY. 10/04/20 10/04/21  Rolly Salter, MD  ?carvedilol (COREG) 12.5 MG tablet Take 1 tablet (12.5 mg total) by mouth 2 (two) times daily with a meal. 11/12/20   Grayce Sessions, NP  ?carvedilol (COREG) 12.5 MG tablet TAKE 1 TABLET (12.5 MG TOTAL) BY MOUTH 2 (TWO) TIMES DAILY WITH A MEAL. 11/12/20 11/12/21  Grayce Sessions, NP  ?losartan-hydrochlorothiazide (HYZAAR) 100-25 MG tablet Take 1 tablet by mouth daily. 11/12/20   Grayce Sessions, NP  ?losartan-hydrochlorothiazide (HYZAAR) 100-25 MG tablet TAKE 1 TABLET BY MOUTH DAILY. 11/12/20 11/12/21  Grayce Sessions, NP  ?   ? ?Allergies    ?Plavix [clopidogrel]   ? ?Review of Systems   ?Review of Systems ? ?Physical  Exam ?Updated Vital Signs ?BP (!) 189/130   Pulse (!) 107   Temp 99 ?F (37.2 ?C) (Oral)   Resp 18   Ht 5\' 7"  (1.702 m)   Wt 90.7 kg   SpO2 100%   BMI 31.32 kg/m?  ?Physical Exam ?Vitals and nursing note reviewed.  ?Constitutional:   ?   Appearance: Normal appearance.  ?HENT:  ?   Head: Normocephalic and atraumatic.  ?Eyes:  ?   General: No scleral icterus. ?   Conjunctiva/sclera: Conjunctivae normal.  ?Pulmonary:  ?   Effort: Pulmonary effort is normal. No respiratory distress.  ?Musculoskeletal:  ?   Comments: Obvious deformity to right fifth digit.  Photo in patient's chart.  Some exposed bone just distal to the DIP.  Sensation intact on volar and dorsal sides of the fifth digit.  Bleeding controlled.  Full range of motion at MCPs and PIP.  Unable to flex or extend DIP ? ?1 cm laceration on volar surface of right fifth digit  ?Skin: ?   Findings: No rash.  ?Neurological:  ?   Mental Status: He is alert.  ?Psychiatric:     ?   Mood and Affect: Mood normal.  ? ? ?ED Results / Procedures / Treatments   ?Labs ?(all labs ordered are listed, but only abnormal results are displayed) ?Labs Reviewed - No data  to display ? ?EKG ?None ? ?Radiology ?DG Finger Little Left ? ?Result Date: 12/24/2021 ?CLINICAL DATA:  Closed finger in truck door today, pain and laceration EXAM: LEFT LITTLE FINGER 2+V COMPARISON:  None Available. FINDINGS: Frontal, oblique, lateral views of the left fifth digit are obtained. There is an open comminuted fracture of the distal tuft of the fifth distal phalanx, with large soft tissue defect. No radiopaque foreign bodies. Joint spaces are well preserved. IMPRESSION: 1. Open comminuted fracture of the distal tuft of the fifth distal phalanx. Electronically Signed   By: Sharlet Salina M.D.   On: 12/24/2021 15:40   ? ?Procedures ?.Nerve Block ? ?Date/Time: 12/24/2021 6:45 PM ?Performed by: Saddie Benders, PA-C ?Authorized by: Saddie Benders, PA-C  ? ?Consent:  ?  Consent obtained:  Verbal ?   Consent given by:  Patient ?  Risks, benefits, and alternatives were discussed: yes   ?  Risks discussed:  Pain, unsuccessful block and nerve damage ?Universal protocol:  ?  Imaging studies available: yes   ?  Patient identity confirmed:  Hospital-assigned identification number ?Indications:  ?  Indications:  Pain relief ?Location:  ?  Body area:  Upper extremity ?  Upper extremity nerve:  Metacarpal ?  Laterality:  Left ?Pre-procedure details:  ?  Skin preparation:  Povidone-iodine ?Skin anesthesia:  ?  Skin anesthesia method:  Local infiltration ?  Local anesthetic:  Bupivacaine 0.5% w/o epi ?Procedure details:  ?  Block needle gauge:  27 G ?  Anesthetic injected:  Bupivacaine 0.5% w/o epi ?  Injection procedure:  Incremental injection, negative aspiration for blood, anatomic landmarks palpated and introduced needle ?  Paresthesia:  Immediately resolved ?Post-procedure details:  ?  Outcome:  Anesthesia achieved ?  Procedure completion:  Tolerated well, no immediate complications  ? ? ?Medications Ordered in ED ?Medications  ?oxyCODONE (Oxy IR/ROXICODONE) immediate release tablet 5 mg (has no administration in time range)  ?bupivacaine (MARCAINE) 0.5 % (with pres) injection 50 mL (has no administration in time range)  ?Tdap (BOOSTRIX) injection 0.5 mL (has no administration in time range)  ? ? ?ED Course/ Medical Decision Making/ A&P ?  ?                        ?Medical Decision Making ?Risk ?Prescription drug management. ? ? ?36 year old male presented with a finger injury.  Slammed his finger in the door. ? ?Imaging: Reviewed and interpreted by me.  I agree with the radiologist the patient has an open comminuted fracture of the distal tuft of the fifth distal phalanx ? ?Consults: I consulted Thompson-Guill with hand surgery who stated that the patient needed nail removal and nailbed repair.  He is willing to come and perform this in the emergency department however will not be able to be here for the next hour.   Patient given oxycodone and digital block for relief at this time. ? ?Treatment: Patient was given oxycodone and I performed a digital block on the fifth digit of the left hand.  Around 2 cc total of bupivacaine was used for the. Patient reported immediate resolution of his pain. ? ?MDM/disposition: At this time patient is awaiting treatment and evaluation by hand surgery. Thompson-Guill reported that he would be in the department as soon as he is able.  Patient signed out to Dr. Adela Lank for reevaluation and ultimate disposition after hand surgery recommendations. ? ?Final Clinical Impression(s) / ED Diagnoses ?Final diagnoses:  ?Open nondisplaced fracture of  distal phalanx of right little finger, initial encounter  ?Crushing injury of finger, initial encounter  ? ? ?  ?Saddie BendersRedwine, Mikaili Flippin A, PA-C ?12/24/21 1853 ? ?  ?Melene PlanFloyd, Dan, DO ?12/24/21 1918 ? ?

## 2021-12-24 NOTE — ED Notes (Signed)
Pt verbalized understanding of d/c instructions, meds, and followup care. Denies questions. VSS, no distress noted. Steady gait to exit with all belongings.  ?

## 2021-12-24 NOTE — Consult Note (Signed)
?ORTHOPAEDIC CONSULTATION HISTORY & PHYSICAL ?REQUESTING PHYSICIAN: Melene Plan, DO ? ?Chief Complaint: L SF injury ? ?HPI: ?William Cantu is a 36 y.o. 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  He presented to the emergency department with pain, and deformity.  He has been evaluated, complete with x-rays, and consultation was made regarding his injury.   ? ?Past Medical History:  ?Diagnosis Date  ? Hypertension   ? ?Past Surgical History:  ?Procedure Laterality Date  ? BUBBLE STUDY  10/04/2020  ? Procedure: BUBBLE STUDY;  Surgeon: Thurmon Fair, MD;  Location: [facility];  Service: Cardiovascular;;  ? TEE WITHOUT CARDIOVERSION N/A 10/04/2020  ? Procedure: TRANSESOPHAGEAL ECHOCARDIOGRAM (TEE);  Surgeon: Thurmon Fair, MD;  Location: [facility] ENDOSCOPY;  Service: Cardiovascular;  Laterality: N/A;  ? ?Social History  ? ?Socioeconomic History  ? Marital status: Single  ?  Spouse name: Not on file  ? Number of children: 2  ? Years of education: Not on file  ? Highest education level: Not on file  ?Occupational History  ? Not on file  ?Tobacco Use  ? Smoking status: Every Day  ?  Packs/day: 0.50  ?  Types: Cigarettes  ? Smokeless tobacco: Never  ?Vaping Use  ? Vaping Use: Never used  ?Substance and Sexual Activity  ? Alcohol use: No  ? Drug use: No  ? Sexual activity: Not on file  ?Other Topics Concern  ? Not on file  ?Social History Narrative  ? Not on file  ? ?Social Determinants of Health  ? ?Financial Resource Strain: Not on file  ?Food Insecurity: Not on file  ?Transportation Needs: Not on file  ?Physical Activity: Not on file  ?Stress: Not on file  ?Social Connections: Not on file  ? ?Family History  ?Problem Relation Age of Onset  ? Hypertension Father   ? ?Allergies  ?Allergen Reactions  ? Plavix [Clopidogrel] Rash  ? ?Prior to Admission medications   ?Medication Sig Start Date End Date  Taking? Authorizing Provider  ?amLODipine (NORVASC) 10 MG tablet Take 1 tablet (10 mg total) by mouth daily. 11/12/20   Grayce Sessions, NP  ?amLODipine (NORVASC) 10 MG tablet TAKE 1 TABLET (10 MG TOTAL) BY MOUTH DAILY. 11/12/20 11/12/21  Grayce Sessions, NP  ?amLODipine (NORVASC) 5 MG tablet TAKE 1 TABLET (5 MG TOTAL) BY MOUTH DAILY. 10/04/20 10/04/21  Rolly Salter, MD  ?atorvastatin (LIPITOR) 40 MG tablet Take 1 tablet (40 mg total) by mouth daily. 11/29/20   Patwardhan, Anabel Bene, MD  ?atorvastatin (LIPITOR) 40 MG tablet TAKE 1 TABLET (40 MG TOTAL) BY MOUTH DAILY. 10/04/20 10/04/21  Rolly Salter, MD  ?carvedilol (COREG) 12.5 MG tablet Take 1 tablet (12.5 mg total) by mouth 2 (two) times daily with a meal. 11/12/20   Grayce Sessions, NP  ?carvedilol (COREG) 12.5 MG tablet TAKE 1 TABLET (12.5 MG TOTAL) BY MOUTH 2 (TWO) TIMES DAILY WITH A MEAL. 11/12/20 11/12/21  Grayce Sessions, NP  ?losartan-hydrochlorothiazide (HYZAAR) 100-25 MG tablet Take 1 tablet by mouth daily. 11/12/20   Grayce Sessions, NP  ?losartan-hydrochlorothiazide (HYZAAR) 100-25 MG tablet TAKE 1 TABLET BY MOUTH DAILY. 11/12/20 11/12/21  Grayce Sessions, NP  ? ?DG Finger Little Left ? ?Result Date: 12/24/2021 ?CLINICAL DATA:  Closed finger in truck door today, pain and laceration EXAM: LEFT LITTLE FINGER 2+V COMPARISON:  None Available. FINDINGS: Frontal, oblique, lateral views of the left fifth digit are obtained. There is an open comminuted fracture of the distal tuft of the fifth distal phalanx, with large soft tissue defect. No radiopaque foreign bodies. Joint spaces are well preserved. IMPRESSION: 1. Open comminuted fracture of the distal tuft of the fifth distal phalanx. Electronically Signed   By: Sharlet Salina M.D.   On: 12/24/2021 15:40   ? ?Positive ROS: All other systems have been reviewed and were otherwise negative with the exception of those mentioned in the HPI and as above. ? ?Physical Exam: ?Vitals: Refer to  EMR. ?Constitutional:  WD, WN, NAD ?HEENT:  NCAT, EOMI ?Neuro/Psych:  Alert & oriented to person, place, and time; appropriate mood & affect ?Lymphatic: No generalized extremity edema or lymphadenopathy ?Extremities / MSK:  The extremities are normal with respect to appearance, ranges of motion, joint stability, muscle strength/tone, sensation, & perfusion except as otherwise noted: ? ?Left small finger has a complex soft tissue injury about the distal aspect of the distal phalanx.  There is an underlying comminuted fracture of the very distal aspect of P3, with an incompletely avulsed nail plate with a flexion deformity, and a sagittal split of the pulp extending from the tip about midway to the DIP flexion crease on the volar surface.  It appears adequately perfused.  Flexor and extensor tendons to the digit are intact.  Sensibility is altered as an incomplete digital block has been rendered ? ?Assessment: ?Complex left small finger soft tissue injury with underlying distal comminuted P3 fracture ? ?Plan/Procedure: ?I discussed these findings with the patient recommended bedside treatment.  He consented.  A finger tourniquet was applied to the base of the digit and then it was copiously irrigated under running water.  Subsequently, the digit was prepped with Betadine and then draped in standard fashion.  The nail plate was first removed, and there was a small 2 x 3 mm area of nailbed that has separated from the rest of it and was adherent to the underside of the nail.  This was removed free from the nail and ultimately used as a free graft.  There were a couple small fragments of bone within the soft tissue flaps formed by the sagittal split down the pulp.  This split measuring approximately a centimeter was brought back together and reapproximated with 6-0 chromic interrupted suture.  In addition to repairing that skin laceration, the complex nailbed laceration was also repaired, incorporating the free nailbed  graft where it appeared to be missing from the dorsal surface of the distal phalanx.  A digital dressing was applied to include a distal tongue blade, and looping around the wrist to keep it from becoming dislodged.  The tourniquet was removed in the process. ? ?In the emergency department, his tetanus has been updated and he is receiving a gram of Ancef.  I discussed an analgesic plan with him that includes Tylenol, ibuprofen, and intermittent oxycodone and 3 to 4 days of oral antibiotics as well.  My office will contact him tomorrow to arrange for continued care, likely with the visit to the office next week on Tuesday. ? ?Cliffton Asters. Janee Morn, MD      ?Orthopaedic & Hand Surgery ?[facility] ?[address] ?[place], [place]  [address] ?Office: [phone] ?Mobile: [phone] ? ?12/24/2021, 7:16 PM  ? ?

## 2023-01-06 ENCOUNTER — Emergency Department (HOSPITAL_BASED_OUTPATIENT_CLINIC_OR_DEPARTMENT_OTHER): Payer: No Typology Code available for payment source

## 2023-01-06 ENCOUNTER — Telehealth: Payer: Self-pay | Admitting: Surgery

## 2023-01-06 ENCOUNTER — Emergency Department (HOSPITAL_BASED_OUTPATIENT_CLINIC_OR_DEPARTMENT_OTHER)
Admission: EM | Admit: 2023-01-06 | Discharge: 2023-01-06 | Disposition: A | Discharge status: Home / Self Care | Payer: No Typology Code available for payment source | Attending: Emergency Medicine | Admitting: Emergency Medicine

## 2023-01-06 ENCOUNTER — Other Ambulatory Visit: Payer: Self-pay

## 2023-01-06 ENCOUNTER — Encounter (HOSPITAL_BASED_OUTPATIENT_CLINIC_OR_DEPARTMENT_OTHER): Payer: Self-pay | Admitting: Emergency Medicine

## 2023-01-06 DIAGNOSIS — R000 Tachycardia, unspecified: Secondary | ICD-10-CM | POA: Diagnosis not present

## 2023-01-06 DIAGNOSIS — I10 Essential (primary) hypertension: Secondary | ICD-10-CM

## 2023-01-06 DIAGNOSIS — E876 Hypokalemia: Secondary | ICD-10-CM

## 2023-01-06 DIAGNOSIS — D72829 Elevated white blood cell count, unspecified: Secondary | ICD-10-CM | POA: Diagnosis not present

## 2023-01-06 DIAGNOSIS — Z79899 Other long term (current) drug therapy: Secondary | ICD-10-CM | POA: Insufficient documentation

## 2023-01-06 DIAGNOSIS — E782 Mixed hyperlipidemia: Secondary | ICD-10-CM

## 2023-01-06 LAB — BASIC METABOLIC PANEL
Anion gap: 10 (ref 5–15)
BUN: 9 mg/dL (ref 6–20)
CO2: 23 mmol/L (ref 22–32)
Calcium: 8.7 mg/dL — ABNORMAL LOW (ref 8.9–10.3)
Chloride: 105 mmol/L (ref 98–111)
Creatinine, Ser: 1.35 mg/dL — ABNORMAL HIGH (ref 0.61–1.24)
GFR, Estimated: >60 mL/min (ref >60)
Glucose, Bld: 134 mg/dL — ABNORMAL HIGH (ref 70–99)
Potassium: 2.9 mmol/L — ABNORMAL LOW (ref 3.5–5.1)
Sodium: 138 mmol/L (ref 135–145)

## 2023-01-06 LAB — URINALYSIS, ROUTINE W REFLEX MICROSCOPIC
Glucose, UA: NEGATIVE mg/dL
Ketones, ur: 15 mg/dL — AB
Leukocytes,Ua: NEGATIVE
Nitrite: NEGATIVE
Protein, ur: 30 mg/dL — AB
Specific Gravity, Urine: >=1.03 (ref 1.005–1.030)
pH: 6 (ref 5.0–8.0)

## 2023-01-06 LAB — URINALYSIS, MICROSCOPIC (REFLEX)

## 2023-01-06 LAB — CBC
HCT: 43.2 % (ref 39.0–52.0)
Hemoglobin: 14.6 g/dL (ref 13.0–17.0)
MCH: 28.4 pg (ref 26.0–34.0)
MCHC: 33.8 g/dL (ref 30.0–36.0)
MCV: 84 fL (ref 80.0–100.0)
Platelets: 203 10*3/uL (ref 150–400)
RBC: 5.14 MIL/uL (ref 4.22–5.81)
RDW: 14 % (ref 11.5–15.5)
WBC: 11 10*3/uL — ABNORMAL HIGH (ref 4.0–10.5)
nRBC: 0 % (ref 0.0–0.2)

## 2023-01-06 LAB — TROPONIN I (HIGH SENSITIVITY): Troponin I (High Sensitivity): 10 ng/L (ref <18)

## 2023-01-06 MED ORDER — AMLODIPINE BESYLATE 5 MG PO TABS
10.0000 mg | ORAL_TABLET | Freq: Once | ORAL | Status: AC
  Administered 2023-01-06: 10 mg via ORAL
  Filled 2023-01-06: qty 2

## 2023-01-06 MED ORDER — CARVEDILOL 12.5 MG PO TABS
ORAL_TABLET | Freq: Two times a day (BID) | ORAL | 3 refills | Status: DC
  Filled 2023-01-06: qty 60, 30d supply, fill #0

## 2023-01-06 MED ORDER — POTASSIUM CHLORIDE CRYS ER 20 MEQ PO TBCR
40.0000 meq | EXTENDED_RELEASE_TABLET | Freq: Once | ORAL | Status: AC
  Administered 2023-01-06: 40 meq via ORAL
  Filled 2023-01-06: qty 2

## 2023-01-06 MED ORDER — AMLODIPINE BESYLATE 10 MG PO TABS
10.0000 mg | ORAL_TABLET | Freq: Every day | ORAL | 3 refills | Status: DC
  Filled 2023-01-06: qty 30, 30d supply, fill #0

## 2023-01-06 MED ORDER — ATORVASTATIN CALCIUM 40 MG PO TABS
40.0000 mg | ORAL_TABLET | Freq: Every day | ORAL | 3 refills | Status: DC
Start: 2023-01-06 — End: 2024-01-13
  Filled 2023-01-06: qty 30, 30d supply, fill #0

## 2023-01-06 MED ORDER — ATORVASTATIN CALCIUM 40 MG PO TABS
40.0000 mg | ORAL_TABLET | Freq: Every day | ORAL | 0 refills | Status: DC
  Filled 2023-01-06: qty 30, 30d supply, fill #0

## 2023-01-06 NOTE — ED Triage Notes (Signed)
Pt reports hx of HTN, has not been taking meds for the last year d/t insurance issues, went for a physical at work on 01/04/23 and Bp was 179/109, has no PCP, denies any symptoms or pain

## 2023-01-06 NOTE — Discharge Instructions (Addendum)
Please pick up the blood pressure medications I have prescribed for you.  You should also receive a call from transfer of care in regards to finding a primary care provider for long-term management of your blood pressure.  Today your labs are reassuring however your potassium is low which was replenished with a potassium pill.  Please monitor your symptoms and symptoms worsen please return to ER.

## 2023-01-06 NOTE — ED Provider Notes (Signed)
EMERGENCY DEPARTMENT AT [facility] Provider Note   CSN: [id number] Arrival date & time: 01/06/23  1712     History  Chief Complaint  Patient presents with   Hypertension    William Cantu is a 37 y.o. male history of right-sided basal ganglia CVA, hypertension presented with high blood pressure.  Patient states he went to work physical today and was told he had high blood pressure and to seek medical care.  Patient's blood pressure at time of arrival was 205/148.  Patient states he is to be on Norvasc, Coreg, Hyzaar however he has not taken these medications over the past year as he cannot afford them due to insurance.  Patient states he is asymptomatic and denies any chest pain, shortness of breath, headaches, neck pain, vision changes, changes sensation/motor skills, nausea/vomiting, abdominal pain.  Patient states he is not currently on any blood thinners.   Home Medications Prior to Admission medications   Medication Sig Start Date End Date Taking? Authorizing Provider  acetaminophen (TYLENOL) 325 MG tablet Take 2 tablets (650 mg total) by mouth every 6 (six) hours. 12/24/21   Mack Hook, MD  amLODipine (NORVASC) 10 MG tablet Take 1 tablet (10 mg total) by mouth daily. 11/12/20   Grayce Sessions, NP  amLODipine (NORVASC) 10 MG tablet TAKE 1 TABLET (10 MG TOTAL) BY MOUTH DAILY. 01/06/23 01/06/24  Netta Corrigan, PA-C  amLODipine (NORVASC) 5 MG tablet TAKE 1 TABLET (5 MG TOTAL) BY MOUTH DAILY. 10/04/20 10/04/21  Rolly Salter, MD  atorvastatin (LIPITOR) 40 MG tablet Take 1 tablet (40 mg total) by mouth daily. 01/06/23   Netta Corrigan, PA-C  atorvastatin (LIPITOR) 40 MG tablet TAKE 1 TABLET (40 MG TOTAL) BY MOUTH DAILY. 01/06/23 01/06/24  Netta Corrigan, PA-C  carvedilol (COREG) 12.5 MG tablet Take 1 tablet (12.5 mg total) by mouth 2 (two) times daily with a meal. 11/12/20   Grayce Sessions, NP  carvedilol (COREG) 12.5 MG tablet TAKE 1 TABLET (12.5 MG  TOTAL) BY MOUTH 2 (TWO) TIMES DAILY WITH A MEAL. 01/06/23 01/06/24  Netta Corrigan, PA-C  ibuprofen (ADVIL) 200 MG tablet Take 3 tablets (600 mg total) by mouth every 6 (six) hours. 12/24/21   Mack Hook, MD  losartan-hydrochlorothiazide (HYZAAR) 100-25 MG tablet Take 1 tablet by mouth daily. 11/12/20   Grayce Sessions, NP  losartan-hydrochlorothiazide (HYZAAR) 100-25 MG tablet TAKE 1 TABLET BY MOUTH DAILY. 11/12/20 11/12/21  Grayce Sessions, NP  oxyCODONE (ROXICODONE) 5 MG immediate release tablet Take 1 tablet (5 mg total) by mouth every 6 (six) hours as needed for severe pain. 12/24/21   Mack Hook, MD      Allergies    Plavix [clopidogrel]    Review of Systems   Review of Systems See HPI Physical Exam Updated Vital Signs BP (!) 187/131   Pulse 86   Temp 98.2 F (36.8 C)   Resp (!) 21   Ht 5\' 7"  (1.702 m)   Wt 88.5 kg   SpO2 98%   BMI 30.54 kg/m  Physical Exam Vitals reviewed.  Constitutional:      General: He is not in acute distress. HENT:     Head: Normocephalic and atraumatic.  Eyes:     Extraocular Movements: Extraocular movements intact.     Conjunctiva/sclera: Conjunctivae normal.     Pupils: Pupils are equal, round, and reactive to light.  Cardiovascular:     Rate and Rhythm: Regular rhythm. Tachycardia present.  Pulses: Normal pulses.     Heart sounds: Normal heart sounds.     Comments: 2+ bilateral radial/dorsalis pedis pulses with regular rate Pulmonary:     Effort: Pulmonary effort is normal. No respiratory distress.     Breath sounds: Normal breath sounds.  Abdominal:     Palpations: Abdomen is soft.     Tenderness: There is no abdominal tenderness. There is no guarding or rebound.  Musculoskeletal:        General: Normal range of motion.     Cervical back: Normal range of motion and neck supple.     Comments: 5 out of 5 bilateral grip/leg extension strength  Skin:    General: Skin is warm and dry.     Capillary Refill: Capillary  refill takes less than 2 seconds.  Neurological:     General: No focal deficit present.     Mental Status: He is alert and oriented to person, place, and time.     Sensory: Sensation is intact.     Motor: Motor function is intact.     Coordination: Coordination is intact.     Gait: Gait is intact.     Comments: Sensation intact in all 4 limbs Vision grossly intact Cranial nerves III through XII intact  Psychiatric:        Mood and Affect: Mood normal.     ED Results / Procedures / Treatments   Labs (all labs ordered are listed, but only abnormal results are displayed) Labs Reviewed  BASIC METABOLIC PANEL - Abnormal; Notable for the following components:      Result Value   Potassium 2.9 (*)    Glucose, Bld 134 (*)    Creatinine, Ser 1.35 (*)    Calcium 8.7 (*)    All other components within normal limits  CBC - Abnormal; Notable for the following components:   WBC 11.0 (*)    All other components within normal limits  URINALYSIS, ROUTINE W REFLEX MICROSCOPIC - Abnormal; Notable for the following components:   Hgb urine dipstick TRACE (*)    Bilirubin Urine SMALL (*)    Ketones, ur 15 (*)    Protein, ur 30 (*)    All other components within normal limits  URINALYSIS, MICROSCOPIC (REFLEX) - Abnormal; Notable for the following components:   Bacteria, UA RARE (*)    All other components within normal limits  TROPONIN I (HIGH SENSITIVITY)    EKG EKG Interpretation  Date/Time:  Wednesday Jan 06 2023 17:19:13 EDT Ventricular Rate:  95 PR Interval:  153 QRS Duration: 78 QT Interval:  331 QTC Calculation: 416 R Axis:   37 Text Interpretation: Sinus rhythm Abnormal T, consider ischemia, diffuse leads Since prior ECG, more prominent TW abnormalities inferior leads, new TW inversoins lateral leads Confirmed by Alvira Monday ([id number]) on 01/06/2023 5:33:01 PM  Radiology DG Chest Port 1 View  Result Date: 01/06/2023 CLINICAL DATA:  Hypertension EXAM: PORTABLE CHEST 1 VIEW  COMPARISON:  None Available. FINDINGS: Transverse diameter of heart is increased. There are no signs of pulmonary edema or focal pulmonary consolidation. There is no pleural effusion or pneumothorax. IMPRESSION: No active disease. Electronically Signed   By: Ernie Avena M.D.   On: 01/06/2023 17:51    Procedures Procedures    Medications Ordered in ED Medications  amLODipine (NORVASC) tablet 10 mg (has no administration in time range)  potassium chloride SA (KLOR-CON M) CR tablet 40 mEq (40 mEq Oral Given 01/06/23 1843)    ED Course/ Medical  Decision Making/ A&P                             Medical Decision Making Amount and/or Complexity of Data Reviewed Labs: ordered. Radiology: ordered.   Deontrey Delford Field 37 y.o. presented today for elevated HTN. Working DDx that I considered at this time includes, but not limited to, HTN urgency/emergency, intracranial hemorrhage, acute renal artery stenosis, acute kidney injury, ACS, ophthalmologic emergencies.  R/o DDx: HTN urgency/emergency, intracranial hemorrhage, acute renal artery stenosis, acute kidney injury, ACS, ophthalmologic emergencies: These are considered less likely due to history of present illness and physical exam findings  Review of prior external notes: 11/29/2020 office visit  Unique Tests and My Interpretation:  CBC: Mild leukocytosis 11.0 BMP: Hypokalemia 2.9 Troponin: 10 EKG: Sinus 95 bpm, T wave inversions noted in the inferior leads which have been seen previously however they are now more prominent, new T wave versions in the lateral leads UA: Unremarkable Chest x-ray: No acute cardiopulmonary changes  Discussion with Independent Historian: None  Discussion of Management of Tests: None  Risk: Medium: prescription drug management  Risk Stratification Score: None  Staffed with Dalene Seltzer, MD  Plan: Patient presented for HTN. On exam patient was in no acute distress but was hypertensive to 205/148.   Patient's physical exam was unremarkable and patient also had negative neuroexam as well.  Patient does have history of stroke with blood pressure in the 200s and has not taken his blood pressure medications over the past year due to insurance.  Labs will be drawn along with chest x-ray.  Patient stable this time.  Patient's labs are overall reassuring.  Patient potassium was 2.9 and so patient will be given potassium pill.  After discussing with the attending we agreed patient does not need CT as physical exam was reassuring and patient is asymptomatic.  Patient be given his Norvasc in the ED before discharge and have his blood pressure medications and statin refilled.  Transfer care was consulted to help patient find a primary care provider is states he is unable to find one due to insurance.  Patient was given extensive education on the dangers of high blood pressure and the importance of maintaining blood pressure and take his medication before discharge.  Patient was given return precautions. Patient stable for discharge at this time.  Patient verbalized understanding of plan.         Final Clinical Impression(s) / ED Diagnoses Final diagnoses:  Hypokalemia  Uncontrolled hypertension    Rx / DC Orders ED Discharge Orders          Ordered    atorvastatin (LIPITOR) 40 MG tablet  Daily        01/06/23 1840    atorvastatin (LIPITOR) 40 MG tablet  Daily        01/06/23 1840    carvedilol (COREG) 12.5 MG tablet  2 times daily with meals        01/06/23 1840    amLODipine (NORVASC) 10 MG tablet  Daily        01/06/23 1840              Remi Deter 01/06/23 1846    Alvira Monday, MD 01/07/23 1131

## 2023-01-06 NOTE — ED Notes (Signed)
Pt stated he was advised to come to the ED for HTN when getting his physical for work. Denies headache, dizziness, or chest pain.

## 2023-01-07 ENCOUNTER — Other Ambulatory Visit: Payer: Self-pay

## 2023-04-29 ENCOUNTER — Ambulatory Visit (INDEPENDENT_AMBULATORY_CARE_PROVIDER_SITE_OTHER): Payer: PRIVATE HEALTH INSURANCE | Admitting: Primary Care

## 2024-01-09 ENCOUNTER — Emergency Department (HOSPITAL_BASED_OUTPATIENT_CLINIC_OR_DEPARTMENT_OTHER)
Admission: EM | Admit: 2024-01-09 | Discharge: 2024-01-09 | Disposition: A | Discharge status: Home / Self Care | Payer: PRIVATE HEALTH INSURANCE | Attending: Emergency Medicine | Admitting: Emergency Medicine

## 2024-01-09 ENCOUNTER — Encounter (HOSPITAL_BASED_OUTPATIENT_CLINIC_OR_DEPARTMENT_OTHER): Payer: Self-pay | Admitting: Emergency Medicine

## 2024-01-09 DIAGNOSIS — I10 Essential (primary) hypertension: Secondary | ICD-10-CM | POA: Diagnosis not present

## 2024-01-09 DIAGNOSIS — Z79899 Other long term (current) drug therapy: Secondary | ICD-10-CM | POA: Insufficient documentation

## 2024-01-09 DIAGNOSIS — R040 Epistaxis: Secondary | ICD-10-CM | POA: Insufficient documentation

## 2024-01-09 MED ORDER — LOSARTAN POTASSIUM-HCTZ 100-25 MG PO TABS: 1.0000 | ORAL_TABLET | Freq: Every day | ORAL | 2 refills | Status: DC

## 2024-01-09 MED ORDER — CARVEDILOL 12.5 MG PO TABS: ORAL_TABLET | Freq: Two times a day (BID) | ORAL | 2 refills | Status: DC

## 2024-01-09 MED ORDER — AMLODIPINE BESYLATE 10 MG PO TABS: 10.0000 mg | ORAL_TABLET | Freq: Every day | ORAL | 2 refills | Status: DC

## 2024-01-09 MED ORDER — OXYMETAZOLINE HCL 0.05 % NA SOLN
2.0000 | Freq: Once | NASAL | Status: AC
  Administered 2024-01-09: 2 via NASAL
  Filled 2024-01-09: qty 30

## 2024-01-09 NOTE — Discharge Instructions (Signed)
 Nosebleeds commonly recur.   Apply direct, uninterrupted pressure over the soft part of your nose for 20 minutes. If your bleeding has not stopped, you may use 1 puff of Afrin spray in the affected nostril. If your bleeding continues, please go to the ER and keep applying pressure to your nose. Never use afrin for more than 3 days.   You may also use a humidifier at home to prevent your nose from drying.    Your blood pressure medications (amlodipine , carvedilol , and losartan  hydrochlorothiazide ) have been refilled.  Please take these daily as prescribed.  Please follow-up with your PCP within the next month for further management of your blood pressure and further medication refills.   Return also immediately to the nearest ED if you develop chest pain, shortness of breath, dizziness or fainting, severe bleeding, or other concerns.

## 2024-01-09 NOTE — ED Triage Notes (Signed)
 Pt reports intermittent nosebleeds since Thurs; no bleeding at present; reports out of HTN meds x 1 wk

## 2024-01-09 NOTE — ED Provider Notes (Signed)
 [place] EMERGENCY DEPARTMENT AT [facility] Provider Note   CSN: [id number] Arrival date & time: 01/09/24  1128     History  Chief Complaint  Patient presents with   Epistaxis    William Cantu is a 38 y.o. male with history of TIA, hypertension, presents with concern for right-sided nosebleed that has been on and off for the past 4 days.  States he normally does not get nosebleeds at home.  Denies any trauma to the nose.  Denies any weakness, dizziness.   Epistaxis      Home Medications Prior to Admission medications   Medication Sig Start Date End Date Taking? Authorizing Provider  amLODipine  (NORVASC ) 10 MG tablet Take 1 tablet (10 mg total) by mouth daily. 01/09/24  Yes Rexie Catena, PA-C  acetaminophen  (TYLENOL ) 325 MG tablet Take 2 tablets (650 mg total) by mouth every 6 (six) hours. 12/24/21   Rober Chimera, MD  atorvastatin  (LIPITOR) 40 MG tablet Take 1 tablet (40 mg total) by mouth daily. 01/06/23   Denese Finn, PA-C  atorvastatin  (LIPITOR) 40 MG tablet TAKE 1 TABLET (40 MG TOTAL) BY MOUTH DAILY. 01/06/23 01/06/24  Denese Finn, PA-C  carvedilol  (COREG ) 12.5 MG tablet TAKE 1 TABLET (12.5 MG TOTAL) BY MOUTH 2 (TWO) TIMES DAILY WITH A MEAL. 01/09/24 01/08/25  Daisee Centner, PA-C  ibuprofen  (ADVIL ) 200 MG tablet Take 3 tablets (600 mg total) by mouth every 6 (six) hours. 12/24/21   Rober Chimera, MD  losartan -hydrochlorothiazide  (HYZAAR) 100-25 MG tablet TAKE 1 TABLET BY MOUTH DAILY. 01/09/24   Rexie Catena, PA-C  oxyCODONE  (ROXICODONE ) 5 MG immediate release tablet Take 1 tablet (5 mg total) by mouth every 6 (six) hours as needed for severe pain. 12/24/21   Rober Chimera, MD      Allergies    Plavix  [clopidogrel ]    Review of Systems   Review of Systems  HENT:  Positive for nosebleeds.     Physical Exam Updated Vital Signs BP (!) 182/139   Pulse 95   Temp 98.3 F (36.8 C)   Resp 20   Ht 5\' 7"  (1.702 m)   Wt 90.7 kg   SpO2 99%    BMI 31.32 kg/m  Physical Exam Vitals and nursing note reviewed.  Constitutional:      Appearance: Normal appearance.  HENT:     Head: Atraumatic.     Nose:     Comments: Evaluated after Afrin administration.  Right nare without any active bleeding.  Dried crusted blood along the lower nasal cavity Cardiovascular:     Rate and Rhythm: Normal rate and regular rhythm.  Pulmonary:     Effort: Pulmonary effort is normal.  Neurological:     General: No focal deficit present.     Mental Status: He is alert.  Psychiatric:        Mood and Affect: Mood normal.        Behavior: Behavior normal.     ED Results / Procedures / Treatments   Labs (all labs ordered are listed, but only abnormal results are displayed) Labs Reviewed - No data to display  EKG None  Radiology No results found.  Procedures Procedures    Medications Ordered in ED Medications  oxymetazoline (AFRIN) 0.05 % nasal spray 2 spray (2 sprays Each Nare Given 01/09/24 1146)    ED Course/ Medical Decision Making/ A&P  Medical Decision Making Risk OTC drugs.     Differential diagnosis includes but is not limited to epistaxis, nose trauma  ED Course:  Upon initial evaluation, patient is well-appearing, stable vital signs aside from elevated blood pressure 182/139.  Afrin was administered prior to my evaluation the patient.   Unable to visualize where the bleed was coming from on exam, however, no current nasal bleeding.  He denies any dizziness, weakness, no tachycardia, low concern for any acute blood loss anemia.  He also mentions he has been out of his blood pressure medications for a couple weeks, and ask if he can get a refill.  Discussed that I can provide a short refill, but he does need to follow-up with his PCP within the next month for recheck of blood pressure and further medication management.  He verbalized understanding.  Stable and appropriate for discharge  home    Medications Given: Afrin   Impression: Right-sided epistaxis  Disposition:  The patient was discharged home with instructions to apply direct pressure to the nare if bleeding recurs.  May also use 1 puff of Afrin in the nasal cavity if bleeding not controlled with pressure.  He understands not to use Afrin for more than 3 consecutive days.  Take blood pressure medications as prescribed.  Follow-up with his PCP within the next month for further blood pressure management. Return precautions given.   This chart was dictated using voice recognition software, Dragon. Despite the best efforts of this provider to proofread and correct errors, errors may still occur which can change documentation meaning.          Final Clinical Impression(s) / ED Diagnoses Final diagnoses:  Hypertension, unspecified type  Right-sided epistaxis    Rx / DC Orders ED Discharge Orders          Ordered    amLODipine  (NORVASC ) 10 MG tablet  Daily        01/09/24 1232    carvedilol  (COREG ) 12.5 MG tablet  2 times daily with meals        01/09/24 1232    losartan -hydrochlorothiazide  (HYZAAR) 100-25 MG tablet  Daily        01/09/24 1232              Rexie Catena, PA-C 01/09/24 1234    Scarlette Currier, MD 01/09/24 2322

## 2024-01-11 DIAGNOSIS — D62 Acute posthemorrhagic anemia: Secondary | ICD-10-CM | POA: Insufficient documentation

## 2024-01-11 DIAGNOSIS — E66811 Obesity, class 1: Secondary | ICD-10-CM | POA: Insufficient documentation

## 2024-01-11 DIAGNOSIS — Z79899 Other long term (current) drug therapy: Secondary | ICD-10-CM | POA: Diagnosis not present

## 2024-01-11 DIAGNOSIS — F17210 Nicotine dependence, cigarettes, uncomplicated: Secondary | ICD-10-CM | POA: Diagnosis not present

## 2024-01-11 DIAGNOSIS — Z6832 Body mass index (BMI) 32.0-32.9, adult: Secondary | ICD-10-CM | POA: Insufficient documentation

## 2024-01-11 DIAGNOSIS — I129 Hypertensive chronic kidney disease with stage 1 through stage 4 chronic kidney disease, or unspecified chronic kidney disease: Secondary | ICD-10-CM | POA: Insufficient documentation

## 2024-01-11 DIAGNOSIS — R040 Epistaxis: Secondary | ICD-10-CM | POA: Diagnosis not present

## 2024-01-11 DIAGNOSIS — N179 Acute kidney failure, unspecified: Secondary | ICD-10-CM | POA: Diagnosis not present

## 2024-01-11 DIAGNOSIS — R55 Syncope and collapse: Secondary | ICD-10-CM | POA: Diagnosis present

## 2024-01-11 DIAGNOSIS — E876 Hypokalemia: Secondary | ICD-10-CM | POA: Insufficient documentation

## 2024-01-11 DIAGNOSIS — N189 Chronic kidney disease, unspecified: Secondary | ICD-10-CM | POA: Diagnosis not present

## 2024-01-12 ENCOUNTER — Observation Stay (HOSPITAL_COMMUNITY)
Admission: EM | Admit: 2024-01-12 | Discharge: 2024-01-13 | Disposition: A | Discharge status: Home / Self Care | Attending: Internal Medicine | Admitting: Internal Medicine

## 2024-01-12 ENCOUNTER — Observation Stay (HOSPITAL_COMMUNITY)

## 2024-01-12 ENCOUNTER — Encounter (HOSPITAL_COMMUNITY): Payer: Self-pay | Admitting: Emergency Medicine

## 2024-01-12 ENCOUNTER — Other Ambulatory Visit: Payer: Self-pay

## 2024-01-12 ENCOUNTER — Observation Stay (HOSPITAL_COMMUNITY)
Admit: 2024-01-12 | Discharge: 2024-01-12 | Disposition: A | Discharge status: Home / Self Care | Attending: Family Medicine | Admitting: Family Medicine

## 2024-01-12 ENCOUNTER — Emergency Department (HOSPITAL_COMMUNITY)

## 2024-01-12 DIAGNOSIS — Z8673 Personal history of transient ischemic attack (TIA), and cerebral infarction without residual deficits: Secondary | ICD-10-CM

## 2024-01-12 DIAGNOSIS — E876 Hypokalemia: Secondary | ICD-10-CM | POA: Diagnosis present

## 2024-01-12 DIAGNOSIS — E66811 Obesity, class 1: Secondary | ICD-10-CM | POA: Insufficient documentation

## 2024-01-12 DIAGNOSIS — E860 Dehydration: Secondary | ICD-10-CM

## 2024-01-12 DIAGNOSIS — N179 Acute kidney failure, unspecified: Secondary | ICD-10-CM | POA: Diagnosis present

## 2024-01-12 DIAGNOSIS — I639 Cerebral infarction, unspecified: Secondary | ICD-10-CM

## 2024-01-12 DIAGNOSIS — R040 Epistaxis: Secondary | ICD-10-CM | POA: Diagnosis not present

## 2024-01-12 DIAGNOSIS — R569 Unspecified convulsions: Secondary | ICD-10-CM | POA: Diagnosis not present

## 2024-01-12 DIAGNOSIS — I739 Peripheral vascular disease, unspecified: Secondary | ICD-10-CM | POA: Diagnosis not present

## 2024-01-12 DIAGNOSIS — R55 Syncope and collapse: Principal | ICD-10-CM | POA: Diagnosis present

## 2024-01-12 DIAGNOSIS — I6340 Cerebral infarction due to embolism of unspecified cerebral artery: Secondary | ICD-10-CM

## 2024-01-12 DIAGNOSIS — R930 Abnormal findings on diagnostic imaging of skull and head, not elsewhere classified: Secondary | ICD-10-CM

## 2024-01-12 DIAGNOSIS — I10 Essential (primary) hypertension: Secondary | ICD-10-CM | POA: Diagnosis not present

## 2024-01-12 DIAGNOSIS — D62 Acute posthemorrhagic anemia: Secondary | ICD-10-CM | POA: Diagnosis present

## 2024-01-12 DIAGNOSIS — N189 Chronic kidney disease, unspecified: Secondary | ICD-10-CM | POA: Diagnosis present

## 2024-01-12 LAB — CBC
HCT: 29.1 % — ABNORMAL LOW (ref 39.0–52.0)
HCT: 30 % — ABNORMAL LOW (ref 39.0–52.0)
HCT: 33.1 % — ABNORMAL LOW (ref 39.0–52.0)
Hemoglobin: 10.9 g/dL — ABNORMAL LOW (ref 13.0–17.0)
Hemoglobin: 9.7 g/dL — ABNORMAL LOW (ref 13.0–17.0)
Hemoglobin: 9.8 g/dL — ABNORMAL LOW (ref 13.0–17.0)
MCH: 28.4 pg (ref 26.0–34.0)
MCH: 28.5 pg (ref 26.0–34.0)
MCH: 28.9 pg (ref 26.0–34.0)
MCHC: 32.7 g/dL (ref 30.0–36.0)
MCHC: 32.9 g/dL (ref 30.0–36.0)
MCHC: 33.3 g/dL (ref 30.0–36.0)
MCV: 85.3 fL (ref 80.0–100.0)
MCV: 87.2 fL (ref 80.0–100.0)
MCV: 87.8 fL (ref 80.0–100.0)
Platelets: 184 10*3/uL (ref 150–400)
Platelets: 193 10*3/uL (ref 150–400)
Platelets: 193 10*3/uL (ref 150–400)
RBC: 3.41 MIL/uL — ABNORMAL LOW (ref 4.22–5.81)
RBC: 3.44 MIL/uL — ABNORMAL LOW (ref 4.22–5.81)
RBC: 3.77 MIL/uL — ABNORMAL LOW (ref 4.22–5.81)
RDW: 14 % (ref 11.5–15.5)
RDW: 14.1 % (ref 11.5–15.5)
RDW: 14.1 % (ref 11.5–15.5)
WBC: 13.4 10*3/uL — ABNORMAL HIGH (ref 4.0–10.5)
WBC: 14.4 10*3/uL — ABNORMAL HIGH (ref 4.0–10.5)
WBC: 24.7 10*3/uL — ABNORMAL HIGH (ref 4.0–10.5)
nRBC: 0 % (ref 0.0–0.2)
nRBC: 0 % (ref 0.0–0.2)
nRBC: 0 % (ref 0.0–0.2)

## 2024-01-12 LAB — COMPREHENSIVE METABOLIC PANEL WITH GFR
ALT: 13 U/L (ref 0–44)
AST: 20 U/L (ref 15–41)
Albumin: 3.7 g/dL (ref 3.5–5.0)
Alkaline Phosphatase: 71 U/L (ref 38–126)
Anion gap: 9 (ref 5–15)
BUN: 36 mg/dL — ABNORMAL HIGH (ref 6–20)
CO2: 21 mmol/L — ABNORMAL LOW (ref 22–32)
Calcium: 8.5 mg/dL — ABNORMAL LOW (ref 8.9–10.3)
Chloride: 107 mmol/L (ref 98–111)
Creatinine, Ser: 2.16 mg/dL — ABNORMAL HIGH (ref 0.61–1.24)
GFR, Estimated: 40 mL/min — ABNORMAL LOW (ref >60)
Glucose, Bld: 122 mg/dL — ABNORMAL HIGH (ref 70–99)
Potassium: 3 mmol/L — ABNORMAL LOW (ref 3.5–5.1)
Sodium: 137 mmol/L (ref 135–145)
Total Bilirubin: 0.9 mg/dL (ref 0.0–1.2)
Total Protein: 6.7 g/dL (ref 6.5–8.1)

## 2024-01-12 LAB — BASIC METABOLIC PANEL WITH GFR
Anion gap: 7 (ref 5–15)
BUN: 25 mg/dL — ABNORMAL HIGH (ref 6–20)
CO2: 23 mmol/L (ref 22–32)
Calcium: 8.3 mg/dL — ABNORMAL LOW (ref 8.9–10.3)
Chloride: 106 mmol/L (ref 98–111)
Creatinine, Ser: 1.49 mg/dL — ABNORMAL HIGH (ref 0.61–1.24)
GFR, Estimated: >60 mL/min (ref >60)
Glucose, Bld: 90 mg/dL (ref 70–99)
Potassium: 3.8 mmol/L (ref 3.5–5.1)
Sodium: 136 mmol/L (ref 135–145)

## 2024-01-12 LAB — TYPE AND SCREEN
ABO/RH(D): A NEG
Antibody Screen: NEGATIVE

## 2024-01-12 LAB — POC OCCULT BLOOD, ED: Fecal Occult Bld: POSITIVE — AB

## 2024-01-12 LAB — GLUCOSE, CAPILLARY: Glucose-Capillary: 117 mg/dL — ABNORMAL HIGH (ref 70–99)

## 2024-01-12 LAB — ABO/RH: ABO/RH(D): A NEG

## 2024-01-12 MED ORDER — TICAGRELOR 90 MG PO TABS
180.0000 mg | ORAL_TABLET | Freq: Once | ORAL | Status: DC
  Filled 2024-01-12: qty 2

## 2024-01-12 MED ORDER — ONDANSETRON HCL 4 MG PO TABS: 4.0000 mg | ORAL_TABLET | Freq: Four times a day (QID) | ORAL | Status: DC | PRN

## 2024-01-12 MED ORDER — TICAGRELOR 90 MG PO TABS: 90.0000 mg | ORAL_TABLET | Freq: Two times a day (BID) | ORAL | Status: DC

## 2024-01-12 MED ORDER — ATORVASTATIN CALCIUM 40 MG PO TABS
40.0000 mg | ORAL_TABLET | Freq: Every day | ORAL | Status: DC
  Administered 2024-01-12: 40 mg via ORAL
  Filled 2024-01-12: qty 1

## 2024-01-12 MED ORDER — MAGNESIUM SULFATE IN D5W 1-5 GM/100ML-% IV SOLN
1.0000 g | Freq: Once | INTRAVENOUS | Status: AC
  Administered 2024-01-12: 1 g via INTRAVENOUS
  Filled 2024-01-12: qty 100

## 2024-01-12 MED ORDER — PANTOPRAZOLE SODIUM 40 MG IV SOLR
40.0000 mg | Freq: Two times a day (BID) | INTRAVENOUS | Status: DC
  Administered 2024-01-12 (×3): 40 mg via INTRAVENOUS
  Filled 2024-01-12 (×3): qty 10

## 2024-01-12 MED ORDER — SODIUM CHLORIDE 0.9 % IV BOLUS (SEPSIS)
1000.0000 mL | Freq: Once | INTRAVENOUS | Status: AC
  Administered 2024-01-12: 1000 mL via INTRAVENOUS

## 2024-01-12 MED ORDER — ACETAMINOPHEN 325 MG PO TABS: 650.0000 mg | ORAL_TABLET | Freq: Four times a day (QID) | ORAL | Status: DC | PRN

## 2024-01-12 MED ORDER — ATORVASTATIN CALCIUM 40 MG PO TABS: 40.0000 mg | ORAL_TABLET | Freq: Every day | ORAL | Status: DC

## 2024-01-12 MED ORDER — SODIUM CHLORIDE 0.9% FLUSH
3.0000 mL | Freq: Two times a day (BID) | INTRAVENOUS | Status: DC
  Administered 2024-01-12 – 2024-01-13 (×4): 3 mL via INTRAVENOUS

## 2024-01-12 MED ORDER — OXYMETAZOLINE HCL 0.05 % NA SOLN
1.0000 | Freq: Two times a day (BID) | NASAL | Status: DC | PRN
  Filled 2024-01-12: qty 15

## 2024-01-12 MED ORDER — GADOBUTROL 1 MMOL/ML IV SOLN
10.0000 mL | Freq: Once | INTRAVENOUS | Status: AC | PRN
  Administered 2024-01-12: 10 mL via INTRAVENOUS

## 2024-01-12 MED ORDER — LACTATED RINGERS IV SOLN: INTRAVENOUS | Status: DC

## 2024-01-12 MED ORDER — ACETAMINOPHEN 650 MG RE SUPP
650.0000 mg | Freq: Four times a day (QID) | RECTAL | Status: DC | PRN
Start: 2024-01-12 — End: 2024-01-13

## 2024-01-12 MED ORDER — POTASSIUM CHLORIDE 10 MEQ/100ML IV SOLN
10.0000 meq | INTRAVENOUS | Status: AC
  Administered 2024-01-12 (×2): 10 meq via INTRAVENOUS
  Filled 2024-01-12 (×3): qty 100

## 2024-01-12 MED ORDER — OXYMETAZOLINE HCL 0.05 % NA SOLN: 1.0000 | Freq: Two times a day (BID) | NASAL | Status: AC | PRN

## 2024-01-12 MED ORDER — STROKE: EARLY STAGES OF RECOVERY BOOK
Freq: Once | Status: AC
  Filled 2024-01-12: qty 1

## 2024-01-12 MED ORDER — ASPIRIN 81 MG PO TBEC
81.0000 mg | DELAYED_RELEASE_TABLET | Freq: Every day | ORAL | Status: DC
  Administered 2024-01-12 – 2024-01-13 (×2): 81 mg via ORAL
  Filled 2024-01-12 (×2): qty 1

## 2024-01-12 MED ORDER — ONDANSETRON HCL 4 MG/2ML IJ SOLN: 4.0000 mg | Freq: Four times a day (QID) | INTRAMUSCULAR | Status: DC | PRN

## 2024-01-12 NOTE — Assessment & Plan Note (Addendum)
 01-12-2024 likely due to recurrent epistaxis. No active nose bleed now. He does not need PRBC transfusion. Pt seen by GI and does not need further evaluation.  Pt seen by GI consult who does not think pt need any further GI evaluation and that his hemoccult positive stool was from his swallowed blood from epistaxis.   01-13-2024 HgB 9.8 stable.

## 2024-01-12 NOTE — Assessment & Plan Note (Signed)
 01-12-2024 repleted. Resolved.

## 2024-01-12 NOTE — ED Triage Notes (Signed)
 Patient BIB EMS from home c/o syncope episode in the bathroom tonight. Patient report nosebleed and black tarry stool since Thursday. Patient denies LOC. Patient denies SOB or chest pain.   250 ml NS given by EMS.

## 2024-01-12 NOTE — Consult Note (Signed)
 NEUROLOGY CONSULT NOTE   Date of service: Jan 12, 2024 Patient Name: William Cantu MRN:  [id number] DOB:  15-Jun-1986 Chief Complaint: "Syncope" Requesting Provider: Unk Garb, DO  History of Present Illness  William Cantu is a 38 y.o. 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 These episodes are new, and he has never experienced them before. Although the epistaxis has improved since arriving at the hospital, he occasionally feels a sensation in the back of his throat.  He describes a recent episode at home where he felt unwell, dizzy, and nauseated, which he attributed to swallowing blood from the epistaxis. He went to the bathroom, felt the need to vomit, and subsequently lost consciousness, falling to the floor. William Cantu, heard him fall and, observed him shaking on the floor, which resembled a seizure. He regained consciousness quickly, was able to respond to questions, and did not experience confusion or memory loss following the episode, but had further shaking after getting up and sitting down on the toilet.   No other episodes of syncope, heart palpitations, chest pain, or limb swelling have occurred.  No recent episodes of slurred speech, vision loss, or unilateral weakness.  He has a history of stroke and was previously prescribed a low dose of aspirin , which is no longer part of his current medication regimen; notes it just came off his list and wasn't given refills of it but there is no specific reason he stopped. He is allergic to Plavix , which caused a reaction when he started taking it.  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 He smokes approximately one cigar a day and is amenable to stopping. He has insurance through his current employer  (insurance has been a barrier to care in the past)  No fever, chills, chest pain, palpitations, limb swelling, slurred speech, vision loss, or unilateral weakness. He reports snoring and episodes of waking up at night, which may suggest sleep apnea.  LKW: Unclear, no specific symptoms attributable to this stroke Modified rankin score: 0-Completely asymptomatic and back to baseline post- stroke (working as a Hydrographic surveyor) IV Thrombolysis: No (reason) -- out of the window, no symptoms EVT: No, exam not consistent with LVO   NIHSS components Score: Comment  1a Level of Conscious 0[x]  1[]  2[]  3[]      1b LOC Questions 0[x]  1[]  2[]       1c LOC Commands 0[x]  1[]  2[]       2 Best Gaze 0[x]  1[]  2[]       3 Visual 0[x]  1[]  2[]  3[]      4 Facial Palsy 0[x]  1[]  2[]  3[]      5a Motor Arm - left 0[x]  1[]  2[]  3[]  4[]  UN[]    5b Motor Arm - Right 0[x]  1[]  2[]  3[]  4[]  UN[]    6a Motor Leg - Left 0[x]  1[]  2[]  3[]  4[]  UN[]    6b Motor Leg - Right 0[x]  1[]  2[]  3[]  4[]  UN[]    7 Limb Ataxia 0[x]  1[]  2[]  UN[]      8 Sensory 0[x]  1[]  2[]  UN[]      9 Best Language 0[x]  1[]  2[]  3[]      10 Dysarthria 0[x]  1[]  2[]  UN[]      11 Extinct. and Inattention 0[x]  1[]  2[]       TOTAL:      ROS  Comprehensive ROS  performed and pertinent positives documented in HPI    Past History   Past Medical History:  Diagnosis Date   Hypertension    TIA (transient ischemic attack) 2022    Past Surgical History:  Procedure Laterality Date   BUBBLE STUDY  10/04/2020   Procedure: BUBBLE STUDY;  Surgeon: Luana Rumple, MD;  Location: [facility] ENDOSCOPY;  Service: Cardiovascular;;   TEE WITHOUT CARDIOVERSION N/A 10/04/2020   Procedure: TRANSESOPHAGEAL ECHOCARDIOGRAM (TEE);  Surgeon: Luana Rumple, MD;  Location: [facility] ENDOSCOPY;  Service: Cardiovascular;  Laterality: N/A;    Family History: Family History  Problem Relation Age of Onset   Hypertension Father     Social History  reports that he has been smoking cigarettes. He  has never used smokeless tobacco. He reports that he does not drink alcohol and does not use drugs.  Allergies  Allergen Reactions   Plavix  [Clopidogrel ] Rash    Medications   Current Facility-Administered Medications:    acetaminophen  (TYLENOL ) tablet 650 mg, 650 mg, Oral, Q6H PRN **OR** acetaminophen  (TYLENOL ) suppository 650 mg, 650 mg, Rectal, Q6H PRN, Opyd, Santana Cue, MD   atorvastatin  (LIPITOR) tablet 40 mg, 40 mg, Oral, Daily, Unk Garb, DO, 40 mg at 01/12/24 1350   ondansetron (ZOFRAN) tablet 4 mg, 4 mg, Oral, Q6H PRN **OR** ondansetron (ZOFRAN) injection 4 mg, 4 mg, Intravenous, Q6H PRN, Opyd, Santana Cue, MD   oxymetazoline  (AFRIN) 0.05 % nasal spray 1 spray, 1 spray, Right Nare, BID PRN, Opyd, Santana Cue, MD   pantoprazole (PROTONIX) injection 40 mg, 40 mg, Intravenous, Q12H, Opyd, Santana Cue, MD, 40 mg at 01/12/24 0946   sodium chloride  flush (NS) 0.9 % injection 3 mL, 3 mL, Intravenous, Q12H, Opyd, Santana Cue, MD, 3 mL at 01/12/24 0946  Vitals   Vitals:   01/12/24 0404 01/12/24 0523 01/12/24 0725 01/12/24 1223  BP: (!) 136/93  (!) 127/96 122/82  Pulse: 93  89 89  Resp:   18 20  Temp:   98.3 F (36.8 C) 98.5 F (36.9 C)  TempSrc:   Oral Oral  SpO2:   100% 100%  Weight:  94.4 kg    Height:  5\' 7"  (1.702 m)      Current vital signs: BP 122/82 (BP Location: Right Arm)   Pulse 89   Temp 98.5 F (36.9 C) (Oral)   Resp 20   Ht 5\' 7"  (1.702 m)   Wt 94.4 kg   SpO2 100%   BMI 32.60 kg/m  Vital signs in last 24 hours: Temp:  [97.5 F (36.4 C)-98.5 F (36.9 C)] 98.5 F (36.9 C) (05/21 1223) Pulse Rate:  [79-102] 89 (05/21 1223) Resp:  [18-20] 20 (05/21 1223) BP: (114-136)/(79-96) 122/82 (05/21 1223) SpO2:  [97 %-100 %] 100 % (05/21 1223) Weight:  [94.4 kg] 94.4 kg (05/21 0523)   Body mass index is 32.6 kg/m.  Physical Exam   Constitutional: Appears well-developed and well-nourished. Good muscle bulk Psych: Affect appropriate to situation, calm and  cooperative Eyes: No scleral injection HENT: No oropharyngeal obstruction.  MSK: no major joint deformities.  Cardiovascular: Normal rate and regular rhythm. Perfusing extremities well Respiratory: Effort normal, non-labored breathing GI: Soft.  No distension. There is no tenderness.  Skin: Warm dry and intact visible skin  Neurologic Examination   Mental Status: Patient is awake, alert, oriented to person, place, month, age, and situation. Patient is able to give a clear and coherent history. No signs of aphasia or neglect Cranial Nerves: II: Visual Fields are full. Pupils  are equal, round, and reactive to light.   III,IV, VI: EOMI without ptosis or diploplia.  V: Facial sensation is symmetric to temperature VII: Facial movement is symmetric.  VIII: hearing is intact to voice XI: Shoulder shrug is symmetric. XII: tongue is midline without atrophy or fasciculations.  Motor: Tone is normal. Bulk is normal. 5/5 strength was present in all four extremities. Finger tapping intact bilaterally Sensory: Sensation is symmetric to light touch and temperature in the arms and legs. Cerebellar: FNF and HKS are intact bilaterally Gait:  Able to rise on heels and toes, stable tandem stance, limited gait assessment due to ongoing IVF infusion  Labs/Imaging/Neurodiagnostic studies   CBC:  Recent Labs  Lab 02-04-2024 0953 2024/02/04 1729  WBC 14.4* 13.4*  HGB 9.7* 9.8*  HCT 29.1* 30.0*  MCV 85.3 87.2  PLT 193 193   Basic Metabolic Panel:  Lab Results  Component Value Date   NA 136 Feb 04, 2024   K 3.8 02-04-2024   CO2 23 02/04/24   GLUCOSE 90 02/04/2024   BUN 25 (H) 02-04-24   CREATININE 1.49 (H) 04-Feb-2024   CALCIUM  8.3 (L) 02/04/24   GFRNONAA >60 04-Feb-2024   Lipid Panel:  Lab Results  Component Value Date   CHOL 159 10/02/2020   HDL 35 (L) 10/02/2020   LDLCALC 104 (H) 10/02/2020   TRIG 98 10/02/2020   CHOLHDL 4.5 10/02/2020   HgbA1c:  Lab Results  Component Value  Date   HGBA1C 5.6 10/02/2020   Urine Drug Screen:     Component Value Date/Time   LABOPIA NONE DETECTED 10/01/2020 2120   COCAINSCRNUR NONE DETECTED 10/01/2020 2120   LABBENZ NONE DETECTED 10/01/2020 2120   AMPHETMU NONE DETECTED 10/01/2020 2120   THCU POSITIVE (A) 10/01/2020 2120   LABBARB NONE DETECTED 10/01/2020 2120    Alcohol Level     Component Value Date/Time   ETH <10 10/01/2020 2120   INR  Lab Results  Component Value Date   INR 1.1 10/01/2020   APTT  Lab Results  Component Value Date   APTT 30 10/01/2020   AED levels: No results found for: "PHENYTOIN", "ZONISAMIDE", "LAMOTRIGINE", "LEVETIRACETA"  CT Head without contrast(Personally reviewed): Old right basal ganglia lacunar infarcts. Worsening patchy low-density areas throughout the periventricular and deep white matter. Differential considerations would include chronic microvascular ischemic disease, vasculitis, or other demyelinating disorder such as MS. This could be further evaluated with MRI if felt clinically indicated.  MRI Brain(Personally reviewed): Acute small-vessel infarct in the right centrum semiovale. Chronic small vessel disease that is progressed from 2022, presence of chronic lacunar infarcts and tortuous intracranial vessels compatible with chronic small vessel disease/hypertension.  Neurodiagnostics rEEG:  This study is within normal limits. No seizures or epileptiform discharges were seen throughout the recording. A normal interictal EEG does not exclude the diagnosis of epilepsy.    ASSESSMENT   William Cantu is a 38 y.o. male presenting with syncope he attributes to feeling unwell secondary to swallowing a lot of blood from epistaxis, found to have an acute stroke.   RECOMMENDATIONS  # Right centrum semiovale stroke, likely small vessel disease but consider embolic etiology given syncope on presentation and size - Stroke labs HgbA1c, fasting lipid panel - MRI brain  - MRA of  the brain without contrast and MRA neck w/wo  - Frequent neuro checks per protocol - Echocardiogram - Prophylactic therapy-Antiplatelet med: Aspirin  - dose 325mg  PO or 300mg  PR, followed by 81 mg daily, lifelong (emphasized to patient) - Consider Ticagrelor  due to Plavix  allergy but holding off for now due to epistaxis  - Risk factor modification, smoking cessation discussed with patient, as well as sleep apnea eval, medication adherence, diet and exercise  - Telemetry monitoring; 30 day event monitor on discharge if no arrythmias captured  - Blood pressure goal   - Permissive hypertension to 220/120 due to acute stroke for today, start to normalize tomorrow to achieve goal normotension in 3-5 days - PT consult, OT consult, Speech consult, not needed as patient is back to baseline - Appreciate medical workup for etiologies of syncope per primary team and management of comorbidities per primary team - Neurology will follow workup above  ______________________________________________________________________   Baldwin Levee MD-PhD [facility] [phone] Available 7 AM to 7 PM, outside these hours please contact Neurologist on call listed on AMION

## 2024-01-12 NOTE — Plan of Care (Signed)
  Problem: Education: Goal: Knowledge of condition and prescribed therapy will improve 01/12/2024 1853 by Clemens Curt, Ashiah Karpowicz H, RN Outcome: Progressing 01/12/2024 1346 by Clemens Curt, Javion Holmer H, RN Outcome: Progressing   Problem: Cardiac: Goal: Will achieve and/or maintain adequate cardiac output 01/12/2024 1853 by Clemens Curt, Jasmond River H, RN Outcome: Progressing 01/12/2024 1346 by Clemens Curt, Aseem Sessums H, RN Outcome: Progressing   Problem: Physical Regulation: Goal: Complications related to the disease process, condition or treatment will be avoided or minimized 01/12/2024 1853 by Clemens Curt, Paymon Rosensteel H, RN Outcome: Progressing 01/12/2024 1346 by Clemens Curt, Shereta Crothers H, RN Outcome: Progressing   Problem: Education: Goal: Knowledge of General Education information will improve Description: Including pain rating scale, medication(s)/side effects and non-pharmacologic comfort measures 01/12/2024 1853 by Clemens Curt, Silvester Reierson H, RN Outcome: Progressing 01/12/2024 1346 by Clemens Curt, Lavonte Palos H, RN Outcome: Progressing   Problem: Health Behavior/Discharge Planning: Goal: Ability to manage health-related needs will improve 01/12/2024 1853 by Clemens Curt, Saniya Tranchina H, RN Outcome: Progressing 01/12/2024 1346 by Clemens Curt, Izael Bessinger H, RN Outcome: Progressing   Problem: Clinical Measurements: Goal: Ability to maintain clinical measurements within normal limits will improve 01/12/2024 1853 by Clemens Curt, Saanvi Hakala H, RN Outcome: Progressing 01/12/2024 1346 by Clemens Curt, Jolonda Gomm H, RN Outcome: Progressing Goal: Will remain free from infection 01/12/2024 1853 by Clemens Curt, Dorotea Hand H, RN Outcome: Progressing 01/12/2024 1346 by Clemens Curt, Lillyn Wieczorek H, RN Outcome: Progressing Goal: Diagnostic test results will improve 01/12/2024 1853 by Clemens Curt, Michaelah Credeur H, RN Outcome: Progressing 01/12/2024 1346 by Clemens Curt, Carter Kassel H, RN Outcome: Progressing Goal: Respiratory complications will improve 01/12/2024 1853 by Clemens Curt,  Zaelynn Fuchs H, RN Outcome: Progressing 01/12/2024 1346 by Clemens Curt, Tatumn Corbridge H, RN Outcome: Progressing Goal: Cardiovascular complication will be avoided 01/12/2024 1853 by Clemens Curt, Mahkai Fangman H, RN Outcome: Progressing 01/12/2024 1346 by Clemens Curt, Connie Hilgert H, RN Outcome: Progressing   Problem: Activity: Goal: Risk for activity intolerance will decrease 01/12/2024 1853 by Clemens Curt, Elridge Stemm H, RN Outcome: Progressing 01/12/2024 1346 by Clemens Curt, Bernardina Cacho H, RN Outcome: Progressing   Problem: Nutrition: Goal: Adequate nutrition will be maintained 01/12/2024 1853 by Clemens Curt, Ashleymarie Granderson H, RN Outcome: Progressing 01/12/2024 1346 by Clemens Curt, Aarion Kittrell H, RN Outcome: Progressing   Problem: Coping: Goal: Level of anxiety will decrease 01/12/2024 1853 by Clemens Curt, Antonios Ostrow H, RN Outcome: Progressing 01/12/2024 1346 by Clemens Curt, Sanah Kraska H, RN Outcome: Progressing   Problem: Elimination: Goal: Will not experience complications related to bowel motility 01/12/2024 1853 by Clemens Curt, Adysson Revelle H, RN Outcome: Progressing 01/12/2024 1346 by Clemens Curt, Esme Durkin H, RN Outcome: Progressing Goal: Will not experience complications related to urinary retention 01/12/2024 1853 by Clemens Curt, Chellie Vanlue H, RN Outcome: Progressing 01/12/2024 1346 by Clemens Curt, Junie Engram H, RN Outcome: Progressing   Problem: Pain Managment: Goal: General experience of comfort will improve and/or be controlled 01/12/2024 1853 by Clemens Curt, Carita Sollars H, RN Outcome: Progressing 01/12/2024 1346 by Clemens Curt, Parley Pidcock H, RN Outcome: Progressing   Problem: Safety: Goal: Ability to remain free from injury will improve 01/12/2024 1853 by Clemens Curt, Syrita Dovel H, RN Outcome: Progressing 01/12/2024 1346 by Clemens Curt, Genora Arp H, RN Outcome: Progressing   Problem: Skin Integrity: Goal: Risk for impaired skin integrity will decrease 01/12/2024 1853 by Clemens Curt, Crystalle Popwell H, RN Outcome: Progressing 01/12/2024 1346 by Clemens Curt, Calley Drenning H,  RN Outcome: Progressing

## 2024-01-12 NOTE — ED Provider Notes (Signed)
 [place] EMERGENCY DEPARTMENT AT [facility] Provider Note   CSN: [id number] Arrival date & time: 01/11/24  2357     History  Chief Complaint  Patient presents with   Near Syncope    William Cantu is a 38 y.o. male.  The history is provided by the patient and a significant other.  Patient w/history of hypertension and previous stroke presents for multiple syncopal episodes.  Patient reports tonight anytime he stood up he felt lightheaded and faint and had multiple syncopal episodes.  Denies any traumatic injuries, denies any headache.  He is not currently on any anticoagulation or antiplatelet therapy Significant other at the bedside reports the patient passed out while in the bathroom and he appeared to have generalized body jerking for up to a minute.  He also was confused afterwards. She reports he may have have had up to 3 syncopal episodes.  She reports he did have a large bout of bloody and dark emesis    Patient reports over the past week he has had intermittent nosebleeds.  At times he does swallow the blood but overall it is improving.  He has also noticed black stool over the past week. No known history of upper GI bleed before.  Denies any NSAID use.  No previous history of peptic ulcer disease. He does not recall having endoscopy or colonoscopy previously  Denies previous history of syncope No chest pain, no shortness of breath.  No abdominal pain   Patient reports previous stroke was due to uncontrolled hypertension Past Medical History:  Diagnosis Date   Hypertension    TIA (transient ischemic attack) 2022    Home Medications Prior to Admission medications   Medication Sig Start Date End Date Taking? Authorizing Provider  amLODipine  (NORVASC ) 10 MG tablet Take 1 tablet (10 mg total) by mouth daily. 01/09/24   Rexie Catena, PA-C  atorvastatin  (LIPITOR) 40 MG tablet Take 1 tablet (40 mg total) by mouth daily. 01/06/23   Denese Finn, PA-C   atorvastatin  (LIPITOR) 40 MG tablet TAKE 1 TABLET (40 MG TOTAL) BY MOUTH DAILY. 01/06/23 01/06/24  Denese Finn, PA-C  carvedilol  (COREG ) 12.5 MG tablet TAKE 1 TABLET (12.5 MG TOTAL) BY MOUTH 2 (TWO) TIMES DAILY WITH A MEAL. 01/09/24 01/08/25  Franaszek, Amanda, PA-C  losartan -hydrochlorothiazide  (HYZAAR) 100-25 MG tablet TAKE 1 TABLET BY MOUTH DAILY. 01/09/24   Rexie Catena, PA-C      Allergies    Plavix  Cincinnatus.Clamp ]    Review of Systems   Review of Systems  Constitutional:  Positive for chills and fatigue.  Cardiovascular:  Negative for chest pain.  Gastrointestinal:  Positive for vomiting.  Neurological:  Positive for syncope. Negative for headaches.    Physical Exam Updated Vital Signs BP 114/79   Pulse 80   Temp (!) 97.5 F (36.4 C) (Oral)   Resp 18   SpO2 97%  Physical Exam CONSTITUTIONAL: Well developed/well nourished, ill-appearing HEAD: Normocephalic/atraumatic, no visible trauma EYES: EOMI/PERRL, conjunctiva pink ENMT: Mucous membranes moist, dried blood in the nares, no active bleeding No signs of facial trauma NECK: supple no meningeal signs SPINE/BACK:entire spine nontender CV: S1/S2 noted, no murmurs/rubs/gallops noted LUNGS: Lungs are clear to auscultation bilaterally, no apparent distress ABDOMEN: soft, nontender, no rebound or guarding, bowel sounds noted throughout abdomen Rectal-dark stool noted, Hemoccult positive.  Nurse present at bedside NEURO: Pt is awake/alert/appropriate, moves all extremitiesx4.  No facial droop.   EXTREMITIES: pulses normal/equal, full ROM, no deformities SKIN: Skin is cool to  touch, color normal  ED Results / Procedures / Treatments   Labs (all labs ordered are listed, but only abnormal results are displayed) Labs Reviewed  COMPREHENSIVE METABOLIC PANEL WITH GFR - Abnormal; Notable for the following components:      Result Value   Potassium 3.0 (*)    CO2 21 (*)    Glucose, Bld 122 (*)    BUN 36 (*)    Creatinine,  Ser 2.16 (*)    Calcium  8.5 (*)    GFR, Estimated 40 (*)    All other components within normal limits  CBC - Abnormal; Notable for the following components:   WBC 24.7 (*)    RBC 3.77 (*)    Hemoglobin 10.9 (*)    HCT 33.1 (*)    All other components within normal limits  POC OCCULT BLOOD, ED - Abnormal; Notable for the following components:   Fecal Occult Bld POSITIVE (*)    All other components within normal limits  TYPE AND SCREEN  ABO/RH    EKG EKG Interpretation Date/Time:  Wednesday Jan 12 2024 00:34:44 EDT Ventricular Rate:  81 PR Interval:  162 QRS Duration:  86 QT Interval:  377 QTC Calculation: 438 R Axis:   38  Text Interpretation: Sinus rhythm Abnormal R-wave progression, early transition No significant change since last tracing Confirmed by Eldon Greenland ([id number]) on 01/12/2024 12:36:47 AM  Radiology CT Head Wo Contrast Result Date: 01/12/2024 CLINICAL DATA:  Seizure, new-onset, no history of trauma.  Syncope EXAM: CT HEAD WITHOUT CONTRAST TECHNIQUE: Contiguous axial images were obtained from the base of the skull through the vertex without intravenous contrast. RADIATION DOSE REDUCTION: This exam was performed according to the departmental dose-optimization program which includes automated exposure control, adjustment of the mA and/or kV according to patient size and/or use of iterative reconstruction technique. COMPARISON:  10/02/2020 FINDINGS: Brain: There are multiple low-density areas in the periventricular and deep white matter bilaterally, right greater than left. These have progressed since prior study. Lacunar infarcts noted in the right basal ganglia as seen on prior MRI. No hemorrhage or hydrocephalus. No acute infarction. Vascular: No hyperdense vessel or unexpected calcification. Skull: No acute calvarial abnormality. Sinuses/Orbits: Mucosal thickening throughout the paranasal sinuses. No air-fluid levels. Other: None IMPRESSION: Old right basal ganglia  lacunar infarcts. Worsening patchy low-density areas throughout the periventricular and deep white matter. Differential considerations would include chronic microvascular ischemic disease, vasculitis, or other demyelinating disorder such as MS. This could be further evaluated with MRI if felt clinically indicated. Electronically Signed   By: Janeece Mechanic M.D.   On: 01/12/2024 01:46    Procedures .Critical Care  Performed by: Eldon Greenland, MD Authorized by: Eldon Greenland, MD   Critical care provider statement:    Critical care time (minutes):  40   Critical care start time:  01/12/2024 1:20 AM   Critical care end time:  01/12/2024 2:00 AM   Critical care time was exclusive of:  Separately billable procedures and treating other patients   Critical care was necessary to treat or prevent imminent or life-threatening deterioration of the following conditions:  Shock and dehydration   Critical care was time spent personally by me on the following activities:  Re-evaluation of patient's condition, pulse oximetry, ordering and review of laboratory studies, ordering and review of radiographic studies, evaluation of patient's response to treatment, examination of patient, obtaining history from patient or surrogate, ordering and performing treatments and interventions, review of old charts and development of treatment plan  with patient or surrogate   I assumed direction of critical care for this patient from another provider in my specialty: no     Care discussed with: admitting provider       Medications Ordered in ED Medications  sodium chloride  0.9 % bolus 1,000 mL (1,000 mLs Intravenous New Bag/Given 01/12/24 0135)    ED Course/ Medical Decision Making/ A&P Clinical Course as of 01/12/24 0232  Wed Jan 12, 2024  0114 Creatinine(!): 2.16 Acute kidney injury [DW]  0114 WBC(!): 24.7 Leukocytosis [DW]  0114 Hemoglobin(!): 10.9 Acute anemia [DW]  0123 Patient presents after multiple  syncopal episodes likely related to recent blood loss, likely GI bleed.  Patient thought this may be related to his recent epistaxis though this is improving.  His girlfriend also reports patient has been having seizure-like activity after the episodes that were prolonged, will obtain CT head.  Patient would benefit from admission for evaluation of his acute anemia, syncope and likely GI bleed [DW]  0230 No acute traumatic injuries, chronic findings noted on CT head  Patient has no new complaints.  He is hemodynamically appropriate.  No new blood loss  Patient will be admitted, discussed with Dr. Brice Campi for admission.  Secure chat sent to gastroenterology on-call [DW]    Clinical Course User Index [DW] Eldon Greenland, MD                                 Medical Decision Making Amount and/or Complexity of Data Reviewed Labs: ordered. Decision-making details documented in ED Course. Radiology: ordered.  Risk Decision regarding hospitalization.   This patient presents to the ED for concern of syncope, this involves an extensive number of treatment options, and is a complaint that carries with it a high risk of complications and morbidity.  The differential diagnosis includes but is not limited to arrhythmia, heart block, orthostatic hypotension, acute anemia, dehydration  Comorbidities that complicate the patient evaluation: Patient's presentation is complicated by their history of previous stroke  Social Determinants of Health: Patient's tobacco use  increases the complexity of managing their presentation  Additional history obtained: Additional history obtained from significant other Records reviewed neurology notes reviewed  Lab Tests: I Ordered, and personally interpreted labs.  The pertinent results include: Acute kidney injury, acute anemia  Imaging Studies ordered: I ordered imaging studies including CT scan head  I independently visualized and interpreted imaging which  showed no acute findings I agree with the radiologist interpretation  Cardiac Monitoring: The patient was maintained on a cardiac monitor.  I personally viewed and interpreted the cardiac monitor which showed an underlying rhythm of:  sinus rhythm  Medicines ordered and prescription drug management: I ordered medication including IV fluids for dehydration   Critical Interventions:   admission  Consultations Obtained: I requested consultation with the admitting physician [facility], and discussed  findings as well as pertinent plan - they recommend: admit  Reevaluation: After the interventions noted above, I reevaluated the patient and found that they have :stayed the same  Complexity of problems addressed: Patient's presentation is most consistent with  acute presentation with potential threat to life or bodily function  Disposition: After consideration of the diagnostic results and the patient's response to treatment,  I feel that the patent would benefit from admission  .           Final Clinical Impression(s) / ED Diagnoses Final diagnoses:  Syncope and collapse  Dehydration  AKI (acute kidney injury) (HCC)  Acute blood loss anemia    Rx / DC Orders ED Discharge Orders     None         Eldon Greenland, MD 01/12/24 [phone]

## 2024-01-12 NOTE — Assessment & Plan Note (Addendum)
 01-12-2024 recently restarted BP meds after running out. He states he got them refilled last week and restarted them.  Given his recent acute CVA, will hold HTN meds until seen by neurology to decided if pt needs permissive HTN or not.  01-13-2024 restart HTN meds.

## 2024-01-12 NOTE — Assessment & Plan Note (Addendum)
 01-12-2024 may have been acute CVA seen on MRI brain from yesterday.. Will ask neurology to see him.  01-13-2024 most likely due to his CVA.  Will obtain 30 day event recorder. Cardiology will mail him Ziopatch to his home.

## 2024-01-12 NOTE — Plan of Care (Signed)

## 2024-01-12 NOTE — Hospital Course (Addendum)
 HPI: Lopaka Karge is a 38 y.o. male with medical history significant for hypertension and history of CVA, now presenting with epistaxis, melena, and syncope.   Patient reports that he developed epistaxis on 01/06/2024.  This was atraumatic in his first episode of this.  He continued to have intermittent bleeding from his right nare and eventually sought evaluation in the ED on 01/09/2024.  Bleeding had stopped by the time he was seen in the ED, heart rate was normal and blood pressure elevated, and he returned home with as needed Afrin.  He continued to have intermittent epistaxis and melena back at home.  The bleeding usually stops before he uses Afrin, but later starts again.   He has now developed lightheadedness and had multiple episodes of syncope tonight.  He has not experienced any chest pain, palpitations, abdominal pain, or nausea but has had a loss of appetite.  He does not use any antiplatelet or NSAIDs.  He has been taking antihypertensives at home but states that his blood pressure is typically elevated despite this.    ED Course: Upon arrival to the ED, patient is found to be afebrile and saturating well on room air with normal HR and stable BP.  EKG demonstrates sinus rhythm.  Labs are most notable for potassium 3.0, BUN 36, creatinine 2.16, WBC 24,700, globin 10.9, and positive FOBT.  Head CT reveals old right basal ganglia infarct and progression of low-density areas throughout the periventricular white matter.   ED physician sent a message to GI with request for routine consultation and the patient was given a liter of NS.    Significant Events: Admitted 01/12/2024 for syncope   Significant Labs: WBC 24.7, HgB 10.9, plt 184 Na 137, K 3.0, CO2 of 21, BUN 36, Scr 2.16, glu 122 TP 6.7, alb 3.7, AST 20, ALT 13, alk phos 71, t. Bili 0.9  Significant Imaging Studies: CT head Old right basal ganglia lacunar infarcts.Worsening patchy low-density areas throughout the periventricular  and deep white matter. Differential considerations would include chronic microvascular ischemic disease, vasculitis, or other demyelinating disorder such as MS. This could be further evaluated with MRI if felt clinically indicated MRI brain - shows acute CVA right semiovale. EEG negative for seizures. MRA brain/neck negative for stenosis Echo negative for thrombus. LVEF 60%. No intra-cardiac shunt  Antibiotic Therapy: Anti-infectives (From admission, onward)    None       Procedures:   Consultants: GI Neurology.

## 2024-01-12 NOTE — Subjective & Objective (Addendum)
 Pt seen and examined. Pt seen by GI consult who does not think pt need any further GI evaluation and that his hemoccult positive stool was from his swallowed blood from epistaxis.   His MRI brain however did show an acute CVA of right semiovale.  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 First CVA was when pt was only 38 yo.  Pt states he had ran out of his HTN meds about 1 week ago but recently got them refilled and started taking them again.  Pt's BP was not elevated on admission.  Pt's girlfriend states pt did lose consciousness yesterday in ER.  Pt has transesophageal echo in 2022 that was negative for intra-atrial shunting.  Pt states he cannot take plavix  due to it causing a whole body rash. He does not take ASA on a regular basis.

## 2024-01-12 NOTE — Progress Notes (Signed)
 PROGRESS NOTE    William Cantu  FAO:[id number] DOB: 1986/05/08 DOA: 01/12/2024 PCP: [facility], [facility]  Subjective: Pt seen and examined. Pt seen by GI consult who does not think pt need any further GI evaluation and that his hemoccult positive stool was from his swallowed blood from epistaxis.   His MRI brain however did show an acute CVA of right semiovale.  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 First CVA was when pt was only 38 yo.  Pt states he had ran out of his HTN meds about 1 week ago but recently got them refilled and started taking them again.  Pt's BP was not elevated on admission.  Pt's girlfriend states pt did lose consciousness yesterday in ER.  Pt has transesophageal echo in 2022 that was negative for intra-atrial shunting.  Pt states he cannot take plavix  due to it causing a whole body rash. He does not take ASA on a regular basis.   Hospital Course: HPI: William Cantu is a 38 y.o. male with medical history significant for hypertension and history of CVA, now presenting with epistaxis, melena, and syncope.   Patient reports that he developed epistaxis on 01/06/2024.  This was atraumatic in his first episode of this.  He continued to have intermittent bleeding from his right nare and eventually sought evaluation in the ED on 01/09/2024.  Bleeding had stopped by the time he was seen in the ED, heart rate was normal and blood pressure elevated, and he returned home with as needed Afrin.  He continued to have intermittent epistaxis and melena back at home.  The bleeding usually stops before he uses Afrin, but later starts again.   He has now developed lightheadedness and had multiple episodes of syncope tonight.  He has not experienced any chest pain, palpitations, abdominal pain, or nausea but has had a loss of appetite.  He does not use any antiplatelet or NSAIDs.  He has been taking antihypertensives at home but states that his blood pressure is typically elevated  despite this.    ED Course: Upon arrival to the ED, patient is found to be afebrile and saturating well on room air with normal HR and stable BP.  EKG demonstrates sinus rhythm.  Labs are most notable for potassium 3.0, BUN 36, creatinine 2.16, WBC 24,700, globin 10.9, and positive FOBT.  Head CT reveals old right basal ganglia infarct and progression of low-density areas throughout the periventricular white matter.   ED physician sent a message to GI with request for routine consultation and the patient was given a liter of NS.    Significant Events: Admitted 01/12/2024 for syncope   Significant Labs: WBC 24.7, HgB 10.9, plt 184 Na 137, K 3.0, CO2 of 21, BUN 36, Scr 2.16, glu 122 TP 6.7, alb 3.7, AST 20, ALT 13, alk phos 71, t. Bili 0.9  Significant Imaging Studies: CT head Old right basal ganglia lacunar infarcts.Worsening patchy low-density areas throughout the periventricular and deep white matter. Differential considerations would include chronic microvascular ischemic disease, vasculitis, or other demyelinating disorder such as MS. This could be further evaluated with MRI if felt clinically indicated MRI brain - shows acute CVA right semiovale. EEG negative for seizures. Antibiotic Therapy: Anti-infectives (From admission, onward)    None       Procedures:   Consultants: GI Neurology.    Assessment and Plan: * Syncope 01-12-2024 may have been acute CVA seen on MRI brain from yesterday.. Will ask neurology to see him.  Acute CVA (cerebrovascular accident) (HCC) 01-12-2024 had LOC yesterday per pt's girlfriend. Currently pt is asymptomatic. Denies any dysarthria, focal weakness or paraesthesias. Has been up walking to bathroom without difficulty. Has eaten without dysphagia.  Epistaxis 01-12-2024 no hx of prior nosebleeds. Pt denies picking his nose. Denies snorting any illicit substances including cocaine. Will refer to ENT as outpatient to nasal endoscopic  eval.  Acute blood loss anemia 01-12-2024 likely due to recurrent epistaxis. No active nose bleed now. He does not need PRBC transfusion. Pt seen by GI and does not need further evaluation.  Pt seen by GI consult who does not think pt need any further GI evaluation and that his hemoccult positive stool was from his swallowed blood from epistaxis.   Acute kidney injury superimposed on chronic kidney disease (HCC) 01-12-2024 improved with IVF. Baseline scr in 2024 of 1.35.  pt has recently run of his meds and restarted them in the last week. Monitor Scr.  Hypokalemia 01-12-2024 repleted. Resolved.  History of ischemic stroke 01-12-2024 had workup in 2022 with negative TEE.  MRA brain/neck in 2022 showed chronic small vessel ischemic disease. Surprising since pt was only 38 yo at the time.  Essential hypertension 01-12-2024 recently restarted BP meds after running out. He states he got them refilled last week and restarted them.  Given his recent acute CVA, will hold HTN meds until seen by neurology to decided if pt needs permissive HTN or not.   DVT prophylaxis: SCDs Start: 01/12/24 0235 SCDs Start: 01/12/24 0233    Code Status: Full Code Family Communication: discussed with pt and girlfriend at bedside Disposition Plan: return home Reason for continuing need for hospitalization: neurology consulted for acute CVA.  Objective: Vitals:   01/12/24 0404 01/12/24 0523 01/12/24 0725 01/12/24 1223  BP: (!) 136/93  (!) 127/96 122/82  Pulse: 93  89 89  Resp:   18 20  Temp:   98.3 F (36.8 C) 98.5 F (36.9 C)  TempSrc:   Oral Oral  SpO2:   100% 100%  Weight:  94.4 kg    Height:  5\' 7"  (1.702 m)      Intake/Output Summary (Last 24 hours) at 01/12/2024 1351 Last data filed at 01/12/2024 0942 Gross per 24 hour  Intake 0 ml  Output --  Net 0 ml   Filed Weights   01/12/24 0523  Weight: 94.4 kg    Examination:  Physical Exam Vitals and nursing note reviewed.  Constitutional:       General: He is not in acute distress.    Appearance: He is not toxic-appearing or diaphoretic.  HENT:     Head: Normocephalic and atraumatic.  Eyes:     General: No scleral icterus. Pulmonary:     Effort: Pulmonary effort is normal. No respiratory distress.  Abdominal:     General: Abdomen is flat.  Skin:    General: Skin is warm and dry.     Capillary Refill: Capillary refill takes less than 2 seconds.  Neurological:     Mental Status: He is alert and oriented to person, place, and time.     Data Reviewed: I have personally reviewed following labs and imaging studies  CBC: Recent Labs  Lab 01/12/24 0021 01/12/24 0953  WBC 24.7* 14.4*  HGB 10.9* 9.7*  HCT 33.1* 29.1*  MCV 87.8 85.3  PLT 184 193   Basic Metabolic Panel: Recent Labs  Lab 01/12/24 0021 01/12/24 0952  NA 137 136  K 3.0* 3.8  CL 107  106  CO2 21* 23  GLUCOSE 122* 90  BUN 36* 25*  CREATININE 2.16* 1.49*  CALCIUM  8.5* 8.3*   GFR: Estimated Creatinine Clearance: 75 mL/min (A) (by C-G formula based on SCr of 1.49 mg/dL (H)). Liver Function Tests: Recent Labs  Lab 01/12/24 0021  AST 20  ALT 13  ALKPHOS 71  BILITOT 0.9  PROT 6.7  ALBUMIN 3.7   CBG: Recent Labs  Lab 01/12/24 0549  GLUCAP 117*   Radiology Studies: EEG adult Result Date: 01/12/2024 William Lack, MD     01/12/2024 11:17 AM Patient Name: William Cantu MRN: [id number] Epilepsy Attending: Arleene Cantu Referring Physician/Provider: Walton Guppy, MD Date: 01/12/2024 Duration: 23.23 mins Patient history: 38yo F with syncope. EEG to evaluate for seizure Level of alertness: Awake AEDs during EEG study: None Technical aspects: This EEG study was done with scalp electrodes positioned according to the 10-20 International system of electrode placement. Electrical activity was reviewed with band pass filter of 1-70Hz , sensitivity of 7 uV/mm, display speed of 37mm/sec with a 60Hz  notched filter applied as appropriate. EEG data were  recorded continuously and digitally stored.  Video monitoring was available and reviewed as appropriate. Description: The posterior dominant rhythm consists of 9 Hz activity of moderate voltage (25-35 uV) seen predominantly in posterior head regions, symmetric and reactive to eye opening and eye closing. Physiologic photic driving was not seen during photic stimulation.  Hyperventilation was not performed.   IMPRESSION: This study is within normal limits. No seizures or epileptiform discharges were seen throughout the recording. A normal interictal EEG does not exclude the diagnosis of epilepsy. William Cantu   MR BRAIN W WO CONTRAST Result Date: 01/12/2024 CLINICAL DATA:  Stroke follow-up, concern for vasculitis or demyelinating disease by CT. EXAM: MRI HEAD WITHOUT AND WITH CONTRAST TECHNIQUE: Multiplanar, multiecho pulse sequences of the brain and surrounding structures were obtained without and with intravenous contrast. CONTRAST:  10mL GADAVIST  GADOBUTROL  1 MMOL/ML IV SOLN COMPARISON:  Head CT from earlier today.  Brain MRI 10/02/2020 FINDINGS: Brain: Wedge of restricted diffusion in the right centrum semiovale. There is a background of patchy FLAIR hyperintensity scattered in the cerebral white matter without specific pattern. Lacunar-type spaces scattered in the cerebral white matter. No acute hemorrhage, hydrocephalus, mass, or collection. Vascular: Tortuous intracranial vessels with which also appear elongated, suspect chronic hypertension. Skull and upper cervical spine: Normal marrow signal. Sinuses/Orbits: Secretions layer in the maxillary sinuses. Negative orbits. IMPRESSION: Acute small-vessel infarct in the right centrum semiovale. Chronic small vessel disease that is progressed from 2022, presence of chronic lacunar infarcts and tortuous intracranial vessels compatible with chronic small vessel disease/hypertension. Electronically Signed   By: Ronnette Coke M.D.   On: 01/12/2024 07:53   CT  Head Wo Contrast Result Date: 01/12/2024 CLINICAL DATA:  Seizure, new-onset, no history of trauma.  Syncope EXAM: CT HEAD WITHOUT CONTRAST TECHNIQUE: Contiguous axial images were obtained from the base of the skull through the vertex without intravenous contrast. RADIATION DOSE REDUCTION: This exam was performed according to the departmental dose-optimization program which includes automated exposure control, adjustment of the mA and/or kV according to patient size and/or use of iterative reconstruction technique. COMPARISON:  10/02/2020 FINDINGS: Brain: There are multiple low-density areas in the periventricular and deep white matter bilaterally, right greater than left. These have progressed since prior study. Lacunar infarcts noted in the right basal ganglia as seen on prior MRI. No hemorrhage or hydrocephalus. No acute infarction. Vascular: No hyperdense vessel or  unexpected calcification. Skull: No acute calvarial abnormality. Sinuses/Orbits: Mucosal thickening throughout the paranasal sinuses. No air-fluid levels. Other: None IMPRESSION: Old right basal ganglia lacunar infarcts. Worsening patchy low-density areas throughout the periventricular and deep white matter. Differential considerations would include chronic microvascular ischemic disease, vasculitis, or other demyelinating disorder such as MS. This could be further evaluated with MRI if felt clinically indicated. Electronically Signed   By: Janeece Mechanic M.D.   On: 01/12/2024 01:46    Scheduled Meds:  atorvastatin   40 mg Oral Daily   pantoprazole (PROTONIX) IV  40 mg Intravenous Q12H   sodium chloride  flush  3 mL Intravenous Q12H   Continuous Infusions:   LOS: 0 days   Time spent: 60 minutes  William Garb, DO  [facility]  01/12/2024, 1:51 PM

## 2024-01-12 NOTE — Assessment & Plan Note (Addendum)
 01-12-2024 no hx of prior nosebleeds. Pt denies picking his nose. Denies snorting any illicit substances including cocaine. Will refer to ENT as outpatient to nasal endoscopic eval.  01-13-2024 outpatient referral to ENT for evaluation of epistaxis.

## 2024-01-12 NOTE — Progress Notes (Signed)
 EEG complete - results pending

## 2024-01-12 NOTE — Procedures (Signed)
 Patient Name: William Cantu  MRN: [id number]  Epilepsy Attending: Arleene Lack  Referring Physician/Provider: Walton Guppy, MD  Date: 01/12/2024 Duration: 23.23 mins  Patient history: 38yo F with syncope. EEG to evaluate for seizure  Level of alertness: Awake  AEDs during EEG study: None  Technical aspects: This EEG study was done with scalp electrodes positioned according to the 10-20 International system of electrode placement. Electrical activity was reviewed with band pass filter of 1-70Hz , sensitivity of 7 uV/mm, display speed of 36mm/sec with a 60Hz  notched filter applied as appropriate. EEG data were recorded continuously and digitally stored.  Video monitoring was available and reviewed as appropriate.  Description: The posterior dominant rhythm consists of 9 Hz activity of moderate voltage (25-35 uV) seen predominantly in posterior head regions, symmetric and reactive to eye opening and eye closing. Physiologic photic driving was not seen during photic stimulation.  Hyperventilation was not performed.     IMPRESSION: This study is within normal limits. No seizures or epileptiform discharges were seen throughout the recording.  A normal interictal EEG does not exclude the diagnosis of epilepsy.   William Cantu

## 2024-01-12 NOTE — Consult Note (Signed)
 [facility] Gastroenterology Consult  Referring Provider: No ref. provider found Primary Care Physician:  [facility], [facility] Primary Gastroenterologist: Para Bold  Reason for Consultation: Melena  HPI: William Cantu is a 38 y.o. male with epistaxis that started 6 days ago presented to the ER with syncope.  This was witnessed by his friend, downtime of approximately 5 minutes, patient reports feeling nauseous, had vomiting, had fecal urgency prior to passing out. Patient states he has been dealing with allergies and nasal stuffiness and has had significant amount of bleeding from the nose.  The amount was so much that he swallowed a lot of blood and states his stools have been black related to epistaxis. Patient denies prior history of black stools, requirement for EGD, NSAID use or use of aspirin . He denies acid reflux, heartburn, difficulty swallowing, pain on swallowing, unintentional weight loss or loss of appetite.   Past Medical History:  Diagnosis Date   Hypertension    TIA (transient ischemic attack) 2022    Past Surgical History:  Procedure Laterality Date   BUBBLE STUDY  10/04/2020   Procedure: BUBBLE STUDY;  Surgeon: Luana Rumple, MD;  Location: [facility] ENDOSCOPY;  Service: Cardiovascular;;   TEE WITHOUT CARDIOVERSION N/A 10/04/2020   Procedure: TRANSESOPHAGEAL ECHOCARDIOGRAM (TEE);  Surgeon: Luana Rumple, MD;  Location: [facility] ENDOSCOPY;  Service: Cardiovascular;  Laterality: N/A;    Prior to Admission medications   Medication Sig Start Date End Date Taking? Authorizing Provider  amLODipine  (NORVASC ) 10 MG tablet Take 1 tablet (10 mg total) by mouth daily. 01/09/24  Yes Rexie Catena, PA-C  carvedilol  (COREG ) 12.5 MG tablet TAKE 1 TABLET (12.5 MG TOTAL) BY MOUTH 2 (TWO) TIMES DAILY WITH A MEAL. 01/09/24 01/08/25 Yes Rexie Catena, PA-C  losartan -hydrochlorothiazide  (HYZAAR) 100-25 MG tablet TAKE 1 TABLET BY MOUTH DAILY. 01/09/24  Yes Rexie Catena, PA-C  Pseudoephedrine-Acetaminophen  (NASAL DECONGESTANT SINUS PO) Place 1 spray into both nostrils daily.   Yes [provider]  atorvastatin  (LIPITOR) 40 MG tablet Take 1 tablet (40 mg total) by mouth daily. Patient not taking: Reported on 01/12/2024 01/06/23   Denese Finn, PA-C  atorvastatin  (LIPITOR) 40 MG tablet TAKE 1 TABLET (40 MG TOTAL) BY MOUTH DAILY. 01/06/23 01/06/24  Denese Finn, PA-C    Current Facility-Administered Medications  Medication Dose Route Frequency Provider Last Rate Last Admin   acetaminophen  (TYLENOL ) tablet 650 mg  650 mg Oral Q6H PRN Opyd, Timothy S, MD       Or   acetaminophen  (TYLENOL ) suppository 650 mg  650 mg Rectal Q6H PRN Opyd, Timothy S, MD       lactated ringers infusion   Intravenous Continuous Opyd, Timothy S, MD 100 mL/hr at 01/12/24 0518 New Bag at 01/12/24 0518   ondansetron (ZOFRAN) tablet 4 mg  4 mg Oral Q6H PRN Opyd, Santana Cue, MD       Or   ondansetron (ZOFRAN) injection 4 mg  4 mg Intravenous Q6H PRN Opyd, Timothy S, MD       oxymetazoline  (AFRIN) 0.05 % nasal spray 1 spray  1 spray Right Nare BID PRN Opyd, Santana Cue, MD       pantoprazole (PROTONIX) injection 40 mg  40 mg Intravenous Q12H Opyd, Timothy S, MD   40 mg at 01/12/24 0946   sodium chloride  flush (NS) 0.9 % injection 3 mL  3 mL Intravenous Q12H Walton Guppy, MD   3 mL at 01/12/24 0946    Allergies as of 01/11/2024 - Review Complete  01/09/2024  Allergen Reaction Noted   Plavix  [clopidogrel ] Rash 11/19/2020    Family History  Problem Relation Age of Onset   Hypertension Father     Social History   Socioeconomic History   Marital status: Single    Spouse name: Not on file   Number of children: 2   Years of education: Not on file   Highest education level: Not on file  Occupational History   Not on file  Tobacco Use   Smoking status: Every Day    Current packs/day: 0.50    Types: Cigarettes   Smokeless tobacco: Never  Vaping Use   Vaping  status: Never Used  Substance and Sexual Activity   Alcohol use: No   Drug use: No   Sexual activity: Not on file  Other Topics Concern   Not on file  Social History Narrative   Not on file   Social Drivers of Health   Financial Resource Strain: Not on file  Food Insecurity: No Food Insecurity (01/12/2024)   Hunger Vital Sign    Worried About Running Out of Food in the Last Year: Never true    Ran Out of Food in the Last Year: Never true  Transportation Needs: No Transportation Needs (01/12/2024)   PRAPARE - Administrator, Civil Service (Medical): No    Lack of Transportation (Non-Medical): No  Physical Activity: Not on file  Stress: Not on file  Social Connections: Unknown (12/24/2021)   Received from [facility]   Social Network    Social Network: Not on file  Intimate Partner Violence: Not At Risk (01/12/2024)   Humiliation, Afraid, Rape, and Kick questionnaire    Fear of Current or Ex-Partner: No    Emotionally Abused: No    Physically Abused: No    Sexually Abused: No    Review of Systems: As per HPI  Physical Exam: Vital signs in last 24 hours: Temp:  [97.5 F (36.4 C)-98.4 F (36.9 C)] 98.3 F (36.8 C) (05/21 0725) Pulse Rate:  [79-102] 89 (05/21 0725) Resp:  [18-20] 18 (05/21 0725) BP: (114-136)/(79-96) 127/96 (05/21 0725) SpO2:  [97 %-100 %] 100 % (05/21 0725) Weight:  [94.4 kg] 94.4 kg (05/21 0523) Last BM Date : 01/11/24  General:   Alert,  Well-developed, well-nourished, pleasant and cooperative in NAD Head:  Normocephalic and atraumatic. Eyes:  Sclera clear, no icterus.   Mild pallor. Ears:  Normal auditory acuity. Nose:  No deformity, discharge,  or lesions. Mouth:  No deformity or lesions.  Oropharynx pink & moist. Neck:  Supple; no masses or thyromegaly. Lungs:  Clear throughout to auscultation.   No wheezes, crackles, or rhonchi. No acute distress. Heart:  Regular rate and rhythm; no murmurs, clicks, rubs,  or  gallops. Extremities:  Without clubbing or edema. Neurologic:  Alert and  oriented x4;  grossly normal neurologically. Skin:  Intact without significant lesions or rashes. Psych:  Alert and cooperative. Normal mood and affect. Abdomen:  Soft, nontender and nondistended. No masses, hepatosplenomegaly or hernias noted. Normal bowel sounds, without guarding, and without rebound.         Lab Results: Recent Labs    01/12/24 0021 01/12/24 0953  WBC 24.7* 14.4*  HGB 10.9* 9.7*  HCT 33.1* 29.1*  PLT 184 193   BMET Recent Labs    01/12/24 0021  NA 137  K 3.0*  CL 107  CO2 21*  GLUCOSE 122*  BUN 36*  CREATININE 2.16*  CALCIUM  8.5*  LFT Recent Labs    01/12/24 0021  PROT 6.7  ALBUMIN 3.7  AST 20  ALT 13  ALKPHOS 71  BILITOT 0.9   PT/INR No results for input(s): "LABPROT", "INR" in the last 72 hours.  Studies/Results: MR BRAIN W WO CONTRAST Result Date: 01/12/2024 CLINICAL DATA:  Stroke follow-up, concern for vasculitis or demyelinating disease by CT. EXAM: MRI HEAD WITHOUT AND WITH CONTRAST TECHNIQUE: Multiplanar, multiecho pulse sequences of the brain and surrounding structures were obtained without and with intravenous contrast. CONTRAST:  10mL GADAVIST  GADOBUTROL  1 MMOL/ML IV SOLN COMPARISON:  Head CT from earlier today.  Brain MRI 10/02/2020 FINDINGS: Brain: Wedge of restricted diffusion in the right centrum semiovale. There is a background of patchy FLAIR hyperintensity scattered in the cerebral white matter without specific pattern. Lacunar-type spaces scattered in the cerebral white matter. No acute hemorrhage, hydrocephalus, mass, or collection. Vascular: Tortuous intracranial vessels with which also appear elongated, suspect chronic hypertension. Skull and upper cervical spine: Normal marrow signal. Sinuses/Orbits: Secretions layer in the maxillary sinuses. Negative orbits. IMPRESSION: Acute small-vessel infarct in the right centrum semiovale. Chronic small vessel  disease that is progressed from 2022, presence of chronic lacunar infarcts and tortuous intracranial vessels compatible with chronic small vessel disease/hypertension. Electronically Signed   By: Ronnette Coke M.D.   On: 01/12/2024 07:53   CT Head Wo Contrast Result Date: 01/12/2024 CLINICAL DATA:  Seizure, new-onset, no history of trauma.  Syncope EXAM: CT HEAD WITHOUT CONTRAST TECHNIQUE: Contiguous axial images were obtained from the base of the skull through the vertex without intravenous contrast. RADIATION DOSE REDUCTION: This exam was performed according to the departmental dose-optimization program which includes automated exposure control, adjustment of the mA and/or kV according to patient size and/or use of iterative reconstruction technique. COMPARISON:  10/02/2020 FINDINGS: Brain: There are multiple low-density areas in the periventricular and deep white matter bilaterally, right greater than left. These have progressed since prior study. Lacunar infarcts noted in the right basal ganglia as seen on prior MRI. No hemorrhage or hydrocephalus. No acute infarction. Vascular: No hyperdense vessel or unexpected calcification. Skull: No acute calvarial abnormality. Sinuses/Orbits: Mucosal thickening throughout the paranasal sinuses. No air-fluid levels. Other: None IMPRESSION: Old right basal ganglia lacunar infarcts. Worsening patchy low-density areas throughout the periventricular and deep white matter. Differential considerations would include chronic microvascular ischemic disease, vasculitis, or other demyelinating disorder such as MS. This could be further evaluated with MRI if felt clinically indicated. Electronically Signed   By: Janeece Mechanic M.D.   On: 01/12/2024 01:46    Impression: Black stools related to swallowing of blood during significant epistaxis  Prior history of CVA, MRI on admission shows acute small vessel infarct in right central semimobile and chronic small vessel disease  progressed from 2022 with chronic lacunar infarcts and tortuous intracranial vessels compatible with chronic small vessel disease and hypertension  Syncope on admission  Plan: Patient clearly states that melena is related to swallowed blood from epistaxis. With newly diagnosed infarct on MRI, I do not see a need for endoscopy. Clinical picture not compatible with a GI bleed. His BUN is elevated at 36 but so his creatinine at 2.16 and a low GFR of 40 His hemoglobin was 14.6 on 01/06/2023 and has trended down to 9.7, but patient complains of significant epistaxis which is likely cause of drop in hemoglobin Occult blood in stool is also likely related to swallowed blood from epistaxis  I would recommend completion of neurological workup, patient getting EEG  currently. I do not plan an endoscopy unless there is indication of GI bleed. Okay to start on regular diet from GI standpoint. Patient is already on pantoprazole 40 mg every 12 hours, continue the same. Dr. Honey Lusty to follow.   LOS: 0 days   Genell Ken, MD  01/12/2024, 10:45 AM

## 2024-01-12 NOTE — H&P (Signed)
 History and Physical    Torrell Krutz UJW:[id number] DOB: [birth year] DOA: 01/12/2024  PCP: Program, [facility]   Patient coming from: Home   Chief Complaint: Epistaxis, syncope, melena    HPI: William Cantu is a 38 y.o. male with medical history significant for hypertension and history of CVA, now presenting with epistaxis, melena, and syncope.  Patient reports that he developed epistaxis on 01/06/2024.  This was atraumatic in his first episode of this.  He continued to have intermittent bleeding from his right nare and eventually sought evaluation in the ED on 01/09/2024.  Bleeding had stopped by the time he was seen in the ED, heart rate was normal and blood pressure elevated, and he returned home with as needed Afrin.  He continued to have intermittent epistaxis and melena back at home.  The bleeding usually stops before he uses Afrin, but later starts again.  He has now developed lightheadedness and had multiple episodes of syncope tonight.  He has not experienced any chest pain, palpitations, abdominal pain, or nausea but has had a loss of appetite.  He does not use any antiplatelet or NSAIDs.  He has been taking antihypertensives at home but states that his blood pressure is typically elevated despite this.   ED Course: Upon arrival to the ED, patient is found to be afebrile and saturating well on room air with normal HR and stable BP.  EKG demonstrates sinus rhythm.  Labs are most notable for potassium 3.0, BUN 36, creatinine 2.16, WBC 24,700, globin 10.9, and positive FOBT.  Head CT reveals old right basal ganglia infarct and progression of low-density areas throughout the periventricular white matter.  ED physician sent a message to GI with request for routine consultation and the patient was given a liter of NS.  Review of Systems:  All other systems reviewed and apart from HPI, are negative.  Past Medical History:  Diagnosis Date   Hypertension     TIA (transient ischemic attack) 2022    Past Surgical History:  Procedure Laterality Date   BUBBLE STUDY  10/04/2020   Procedure: BUBBLE STUDY;  Surgeon: Luana Rumple, MD;  Location: [facility] ENDOSCOPY;  Service: Cardiovascular;;   TEE WITHOUT CARDIOVERSION N/A 10/04/2020   Procedure: TRANSESOPHAGEAL ECHOCARDIOGRAM (TEE);  Surgeon: Luana Rumple, MD;  Location: [facility] ENDOSCOPY;  Service: Cardiovascular;  Laterality: N/A;    Social History:   reports that he has been smoking cigarettes. He has never used smokeless tobacco. He reports that he does not drink alcohol and does not use drugs.  Allergies  Allergen Reactions   Plavix  [Clopidogrel ] Rash    Family History  Problem Relation Age of Onset   Hypertension Father      Prior to Admission medications   Medication Sig Start Date End Date Taking? Authorizing Provider  amLODipine  (NORVASC ) 10 MG tablet Take 1 tablet (10 mg total) by mouth daily. 01/09/24   Rexie Catena, PA-C  atorvastatin  (LIPITOR) 40 MG tablet Take 1 tablet (40 mg total) by mouth daily. 01/06/23   Denese Finn, PA-C  atorvastatin  (LIPITOR) 40 MG tablet TAKE 1 TABLET (40 MG TOTAL) BY MOUTH DAILY. 01/06/23 01/06/24  Denese Finn, PA-C  carvedilol  (COREG ) 12.5 MG tablet TAKE 1 TABLET (12.5 MG TOTAL) BY MOUTH 2 (TWO) TIMES DAILY WITH A MEAL. 01/09/24 01/08/25  Franaszek, Amanda, PA-C  losartan -hydrochlorothiazide  (HYZAAR) 100-25 MG tablet TAKE 1 TABLET BY MOUTH DAILY. 01/09/24   Rexie Catena, PA-C    Physical Exam: Vitals:   01/12/24  0012  BP: 114/79  Pulse: 80  Resp: 18  Temp: (!) 97.5 F (36.4 C)  TempSrc: Oral  SpO2: 97%    Constitutional: NAD, calm  Eyes: PERTLA, lids and conjunctivae normal ENMT: Mucous membranes are moist. Posterior pharynx clear of any exudate or lesions.   Neck: supple, no masses  Respiratory: no wheezing, no crackles. No accessory muscle use.  Cardiovascular: S1 & S2 heard, regular rate and rhythm. No extremity edema.    Abdomen: No distension, no tenderness, soft. Bowel sounds active.  Musculoskeletal: no clubbing / cyanosis. No joint deformity upper and lower extremities.   Skin: no significant rashes, lesions, ulcers. Warm, dry, well-perfused. Neurologic: CN 2-12 grossly intact. Moving all extremities. Alert and oriented.  Psychiatric: Pleasant. Cooperative.    Labs and Imaging on Admission: I have personally reviewed following labs and imaging studies  CBC: Recent Labs  Lab 01/12/24 0021  WBC 24.7*  HGB 10.9*  HCT 33.1*  MCV 87.8  PLT 184   Basic Metabolic Panel: Recent Labs  Lab 01/12/24 0021  NA 137  K 3.0*  CL 107  CO2 21*  GLUCOSE 122*  BUN 36*  CREATININE 2.16*  CALCIUM  8.5*   GFR: Estimated Creatinine Clearance: 50.8 mL/min (A) (by C-G formula based on SCr of 2.16 mg/dL (H)). Liver Function Tests: Recent Labs  Lab 01/12/24 0021  AST 20  ALT 13  ALKPHOS 71  BILITOT 0.9  PROT 6.7  ALBUMIN 3.7   No results for input(s): "LIPASE", "AMYLASE" in the last 168 hours. No results for input(s): "AMMONIA" in the last 168 hours. Coagulation Profile: No results for input(s): "INR", "PROTIME" in the last 168 hours. Cardiac Enzymes: No results for input(s): "CKTOTAL", "CKMB", "CKMBINDEX", "TROPONINI" in the last 168 hours. BNP (last 3 results) No results for input(s): "PROBNP" in the last 8760 hours. HbA1C: No results for input(s): "HGBA1C" in the last 72 hours. CBG: No results for input(s): "GLUCAP" in the last 168 hours. Lipid Profile: No results for input(s): "CHOL", "HDL", "LDLCALC", "TRIG", "CHOLHDL", "LDLDIRECT" in the last 72 hours. Thyroid Function Tests: No results for input(s): "TSH", "T4TOTAL", "FREET4", "T3FREE", "THYROIDAB" in the last 72 hours. Anemia Panel: No results for input(s): "VITAMINB12", "FOLATE", "FERRITIN", "TIBC", "IRON", "RETICCTPCT" in the last 72 hours. Urine analysis:    Component Value Date/Time   COLORURINE YELLOW 01/06/2023 1726    APPEARANCEUR CLEAR 01/06/2023 1726   LABSPEC >=1.030 01/06/2023 1726   PHURINE 6.0 01/06/2023 1726   GLUCOSEU NEGATIVE 01/06/2023 1726   HGBUR TRACE (A) 01/06/2023 1726   BILIRUBINUR SMALL (A) 01/06/2023 1726   KETONESUR 15 (A) 01/06/2023 1726   PROTEINUR 30 (A) 01/06/2023 1726   NITRITE NEGATIVE 01/06/2023 1726   LEUKOCYTESUR NEGATIVE 01/06/2023 1726   Sepsis Labs: @LABRCNTIP (procalcitonin:4,lacticidven:4) )No results found for this or any previous visit (from the past 240 hours).   Radiological Exams on Admission: CT Head Wo Contrast Result Date: 01/12/2024 CLINICAL DATA:  Seizure, new-onset, no history of trauma.  Syncope EXAM: CT HEAD WITHOUT CONTRAST TECHNIQUE: Contiguous axial images were obtained from the base of the skull through the vertex without intravenous contrast. RADIATION DOSE REDUCTION: This exam was performed according to the departmental dose-optimization program which includes automated exposure control, adjustment of the mA and/or kV according to patient size and/or use of iterative reconstruction technique. COMPARISON:  10/02/2020 FINDINGS: Brain: There are multiple low-density areas in the periventricular and deep white matter bilaterally, right greater than left. These have progressed since prior study. Lacunar infarcts noted in the  right basal ganglia as seen on prior MRI. No hemorrhage or hydrocephalus. No acute infarction. Vascular: No hyperdense vessel or unexpected calcification. Skull: No acute calvarial abnormality. Sinuses/Orbits: Mucosal thickening throughout the paranasal sinuses. No air-fluid levels. Other: None IMPRESSION: Old right basal ganglia lacunar infarcts. Worsening patchy low-density areas throughout the periventricular and deep white matter. Differential considerations would include chronic microvascular ischemic disease, vasculitis, or other demyelinating disorder such as MS. This could be further evaluated with MRI if felt clinically indicated.  Electronically Signed   By: Janeece Mechanic M.D.   On: 01/12/2024 01:46    EKG: Independently reviewed. Sinus rhythm, T-wave inversions.   Assessment/Plan   1. Syncope  - Preceded by lightheadedness in setting of acute anemia and hypovolemia  - Check orthostatic vitals, continue cardiac monitoring, continue IVF hydration, trend H&H and transfuse if needed    2. Acute blood-loss anemia - Hgb is 10.9, down from 14.6 one year ago    - No bleeding since arrival in ED  - Trend H&H    3. AKI  - Prerenal in setting of blood-loss and anorexia    - Continue IVF hydration, hold ARB and diuretic, renally-dose medications, and repeat chem panel in am    4. Hx of CVA; progression of periventricular lesions  - MRI brain for further characterization of head CT findings   5. Hypokalemia  - Replacing   6. Hypertension  - Hold antihypertensives and treat as-needed only for now   DVT prophylaxis: SCDs  Code Status: Full  Level of Care: Level of care: Progressive Family Communication: Significant other  Disposition Plan:  Patient is from: home  Anticipated d/c is to: Home  Anticipated d/c date is: 5/22 or 01/14/24  Patient currently: Pending stable H&H, orthostatic vitals, cardiac monitoring, EEG, and MRI brain, stable renal function   Consults called: ED sent secure chat to GI   Admission status: Observation     Walton Guppy, MD [facility]  01/12/2024, 2:35 AM

## 2024-01-12 NOTE — Assessment & Plan Note (Addendum)
 01-12-2024 had LOC yesterday per pt's girlfriend. Currently pt is asymptomatic. Denies any dysarthria, focal weakness or paraesthesias. Has been up walking to bathroom without difficulty. Has eaten without dysphagia.  01-13-2024 neurology did not think pt needed PT/OT/ST consult. MRA head/neck negative for stenosis. Echo negative for thrombus.  Given his recent bouts of epistaxis, I think he should only be on ASA 81 mg daily until he has his ENT evaluation to make sure he doesn't need any nasal cautery.  He will f/u with Stroke clinic after his ENT consult and maybe he can be changed to Brilinta and/or ASA. Given this is his 2nd or 3rd CVA at young age of 57, he needs aggressive risk factor reduction.  Continue lipitor 40 mg daily.

## 2024-01-12 NOTE — Assessment & Plan Note (Signed)
 01-12-2024 had workup in 2022 with negative TEE.  MRA brain/neck in 2022 showed chronic small vessel ischemic disease. Surprising since pt was only 38 yo at the time.

## 2024-01-12 NOTE — Plan of Care (Signed)
  Problem: Education: Goal: Knowledge of condition and prescribed therapy will improve Outcome: Progressing   

## 2024-01-12 NOTE — Progress Notes (Signed)
   01/12/24 0850  TOC Brief Assessment  Insurance and Status Reviewed  Patient has primary care physician Yes  Home environment has been reviewed Resides in single family home with parent  Prior level of function: Independent with ADLs at baseline  Prior/Current Home Services No current home services  Social Drivers of Health Review SDOH reviewed no interventions necessary  Readmission risk has been reviewed Yes  Transition of care needs no transition of care needs at this time

## 2024-01-12 NOTE — Assessment & Plan Note (Addendum)
 01-12-2024 improved with IVF. Baseline scr in 2024 of 1.35.  pt has recently run of his meds and restarted them in the last week. Monitor Scr.  01-13-2024 Scr 1.54. he may need referral as outpatient to nephrology for monitoring. Defer to PCP.

## 2024-01-13 ENCOUNTER — Observation Stay (HOSPITAL_BASED_OUTPATIENT_CLINIC_OR_DEPARTMENT_OTHER)

## 2024-01-13 ENCOUNTER — Other Ambulatory Visit: Payer: Self-pay | Admitting: Cardiology

## 2024-01-13 ENCOUNTER — Observation Stay (HOSPITAL_COMMUNITY)

## 2024-01-13 DIAGNOSIS — N179 Acute kidney failure, unspecified: Secondary | ICD-10-CM | POA: Diagnosis not present

## 2024-01-13 DIAGNOSIS — I739 Peripheral vascular disease, unspecified: Secondary | ICD-10-CM | POA: Diagnosis not present

## 2024-01-13 DIAGNOSIS — I6389 Other cerebral infarction: Secondary | ICD-10-CM | POA: Diagnosis not present

## 2024-01-13 DIAGNOSIS — D62 Acute posthemorrhagic anemia: Secondary | ICD-10-CM | POA: Diagnosis not present

## 2024-01-13 DIAGNOSIS — I639 Cerebral infarction, unspecified: Secondary | ICD-10-CM | POA: Diagnosis not present

## 2024-01-13 DIAGNOSIS — I6340 Cerebral infarction due to embolism of unspecified cerebral artery: Secondary | ICD-10-CM | POA: Diagnosis not present

## 2024-01-13 DIAGNOSIS — R040 Epistaxis: Secondary | ICD-10-CM | POA: Diagnosis not present

## 2024-01-13 DIAGNOSIS — R55 Syncope and collapse: Secondary | ICD-10-CM | POA: Diagnosis not present

## 2024-01-13 DIAGNOSIS — E66811 Obesity, class 1: Secondary | ICD-10-CM | POA: Insufficient documentation

## 2024-01-13 LAB — LIPID PANEL
Cholesterol: 120 mg/dL (ref 0–200)
HDL: 24 mg/dL — ABNORMAL LOW (ref >40)
LDL Cholesterol: 64 mg/dL (ref 0–99)
Total CHOL/HDL Ratio: 5 ratio
Triglycerides: 162 mg/dL — ABNORMAL HIGH (ref <150)
VLDL: 32 mg/dL (ref 0–40)

## 2024-01-13 LAB — BASIC METABOLIC PANEL WITH GFR
Anion gap: 8 (ref 5–15)
BUN: 17 mg/dL (ref 6–20)
CO2: 26 mmol/L (ref 22–32)
Calcium: 8.4 mg/dL — ABNORMAL LOW (ref 8.9–10.3)
Chloride: 102 mmol/L (ref 98–111)
Creatinine, Ser: 1.54 mg/dL — ABNORMAL HIGH (ref 0.61–1.24)
GFR, Estimated: 60 mL/min — ABNORMAL LOW (ref >60)
Glucose, Bld: 114 mg/dL — ABNORMAL HIGH (ref 70–99)
Potassium: 3.2 mmol/L — ABNORMAL LOW (ref 3.5–5.1)
Sodium: 136 mmol/L (ref 135–145)

## 2024-01-13 LAB — HIV ANTIBODY (ROUTINE TESTING W REFLEX): HIV Screen 4th Generation wRfx: NONREACTIVE

## 2024-01-13 LAB — ECHOCARDIOGRAM COMPLETE
AR max vel: 3.09 cm2
AV Peak grad: 6.6 mmHg
Ao pk vel: 1.28 m/s
Area-P 1/2: 3.74 cm2
Height: 67 in
MV VTI: 3.93 cm2
S' Lateral: 2.9 cm
Weight: 3291.03 [oz_av]

## 2024-01-13 LAB — CBC
HCT: 30 % — ABNORMAL LOW (ref 39.0–52.0)
HCT: 30.3 % — ABNORMAL LOW (ref 39.0–52.0)
Hemoglobin: 9.7 g/dL — ABNORMAL LOW (ref 13.0–17.0)
Hemoglobin: 9.8 g/dL — ABNORMAL LOW (ref 13.0–17.0)
MCH: 28 pg (ref 26.0–34.0)
MCH: 27.9 pg (ref 26.0–34.0)
MCHC: 32.3 g/dL (ref 30.0–36.0)
MCHC: 32.3 g/dL (ref 30.0–36.0)
MCV: 86.7 fL (ref 80.0–100.0)
MCV: 86.3 fL (ref 80.0–100.0)
Platelets: 203 10*3/uL (ref 150–400)
Platelets: 214 10*3/uL (ref 150–400)
RBC: 3.46 MIL/uL — ABNORMAL LOW (ref 4.22–5.81)
RBC: 3.51 MIL/uL — ABNORMAL LOW (ref 4.22–5.81)
RDW: 14.1 % (ref 11.5–15.5)
RDW: 14 % (ref 11.5–15.5)
WBC: 10.4 10*3/uL (ref 4.0–10.5)
WBC: 10.1 10*3/uL (ref 4.0–10.5)
nRBC: 0 % (ref 0.0–0.2)
nRBC: 0 % (ref 0.0–0.2)

## 2024-01-13 LAB — GLUCOSE, CAPILLARY: Glucose-Capillary: 109 mg/dL — ABNORMAL HIGH (ref 70–99)

## 2024-01-13 LAB — HEMOGLOBIN A1C
Hgb A1c MFr Bld: 5.1 % (ref 4.8–5.6)
Mean Plasma Glucose: 99.67 mg/dL

## 2024-01-13 SURGERY — EGD (ESOPHAGOGASTRODUODENOSCOPY)
Anesthesia: Monitor Anesthesia Care

## 2024-01-13 MED ORDER — ATORVASTATIN CALCIUM 40 MG PO TABS: 40.0000 mg | ORAL_TABLET | Freq: Every day | ORAL | 0 refills | Status: DC

## 2024-01-13 MED ORDER — AMLODIPINE BESYLATE 5 MG PO TABS
10.0000 mg | ORAL_TABLET | Freq: Every day | ORAL | Status: DC
  Administered 2024-01-13: 10 mg via ORAL
  Filled 2024-01-13: qty 2

## 2024-01-13 MED ORDER — GADOBUTROL 1 MMOL/ML IV SOLN
9.0000 mL | Freq: Once | INTRAVENOUS | Status: AC | PRN
  Administered 2024-01-13: 9 mL via INTRAVENOUS

## 2024-01-13 MED ORDER — ASPIRIN 81 MG PO TBEC: 81.0000 mg | DELAYED_RELEASE_TABLET | Freq: Every day | ORAL | Status: AC

## 2024-01-13 MED ORDER — POTASSIUM CHLORIDE CRYS ER 20 MEQ PO TBCR
40.0000 meq | EXTENDED_RELEASE_TABLET | Freq: Once | ORAL | Status: AC
  Administered 2024-01-13: 40 meq via ORAL
  Filled 2024-01-13: qty 2

## 2024-01-13 MED ORDER — PANTOPRAZOLE SODIUM 40 MG PO TBEC
40.0000 mg | DELAYED_RELEASE_TABLET | Freq: Two times a day (BID) | ORAL | Status: DC
  Administered 2024-01-13: 40 mg via ORAL
  Filled 2024-01-13: qty 1

## 2024-01-13 NOTE — Progress Notes (Signed)
 OT Cancellation Note  Patient Details Name: William Cantu MRN: [id number] DOB: 11/03/1985   Cancelled Treatment:    Reason Eval/Treat Not Completed: OT screened, no needs identified, will sign off. Pt received in room with girlfriend present. Pt has been independent for ADLs in room and took a shower last night without difficulties. Pt reports back to baseline. Denies changes to vision, sensation, strength or balance. OT recommended pt discuss with MD prior to returning to driving. Pt verbalizes understanding, OT to complete orders. Thank you for the referral.  Bridgette Campus L. Wilbern Pennypacker, OTR/L  01/13/24, 10:18 AM

## 2024-01-13 NOTE — Progress Notes (Signed)
 [facility] Progress Note  William Cantu 38 y.o. 1986/04/05   Subjective: Denies abdominal pain. About to get echocardiogram.  Objective: Vital signs: Vitals:   01/12/24 2122 01/13/24 0456  BP: (!) 155/102 130/88  Pulse: 89 83  Resp: 20   Temp: 98.7 F (37.1 C) 98.5 F (36.9 C)  SpO2: 99% 100%    Physical Exam: Gen: alert, no acute distress, well-nourished  HEENT: anicteric sclera CV: RRR Chest: CTA B Abd: soft, nontender, nondistended, +BS Ext: no edema  Lab Results: Recent Labs    01/12/24 0952 01/13/24 0526  NA 136 136  K 3.8 3.2*  CL 106 102  CO2 23 26  GLUCOSE 90 114*  BUN 25* 17  CREATININE 1.49* 1.54*  CALCIUM  8.3* 8.4*   Recent Labs    01/12/24 0021  AST 20  ALT 13  ALKPHOS 71  BILITOT 0.9  PROT 6.7  ALBUMIN 3.7   Recent Labs    01/12/24 1729 01/13/24 0526  WBC 13.4* 10.4  HGB 9.8* 9.7*  HCT 30.0* 30.0*  MCV 87.2 86.7  PLT 193 203      Assessment/Plan: Melena likely due to epistaxis. Hgb stable at 9.7. Hold off on EGD unless ongoing melena without epistaxis. Continue PPI PO BID. [facility] will sign off. Call us  back if questions.   William Cantu 01/13/2024, 10:44 AM  Questions please call [phone] ID: William Cantu, male   DOB: 02/13/1986, 38 y.o.   MRN: [id number]

## 2024-01-13 NOTE — Progress Notes (Signed)
 NEUROLOGY CONSULT FOLLOW UP NOTE   Date of service: Jan 13, 2024 Patient Name: William Cantu MRN:  [id number] DOB:  Feb 08, 1986  Interval Hx/subjective   30 day heart monitor requested.  Patient is agreeable to taking Brilinta and aspirin  for 21 days.  He has not had any epistaxis since he was admitted to the hospital. Vitals   Vitals:   01/12/24 2122 01/13/24 0455 01/13/24 0456 01/13/24 1344  BP: (!) 155/102  130/88 (!) 156/102  Pulse: 89  83 87  Resp: 20   20  Temp: 98.7 F (37.1 C)  98.5 F (36.9 C) 98.4 F (36.9 C)  TempSrc: Oral  Oral Oral  SpO2: 99%  100% 100%  Weight:  93.3 kg    Height:         Body mass index is 32.22 kg/m.  Physical Exam   Constitutional: Appears well-developed and well-nourished.  Psych: Affect appropriate to situation.  Eyes: No scleral injection.  Cardiovascular: Normal rate and regular rhythm.  Respiratory: Effort normal, non-labored breathing.  Skin: WDI.   Neurologic Examination   Mental Status: Patient is awake, alert, oriented to person, place, month, age, and situation. Patient is able to give a clear and coherent history. No signs of aphasia or neglect Cranial Nerves: II: Visual Fields are full. Pupils are equal, round, and reactive to light.   III,IV, VI: EOMI without ptosis or diploplia.  V: Facial sensation is symmetric to temperature VII: Facial movement is symmetric.  VIII: hearing is intact to voice XI: Shoulder shrug is symmetric. XII: tongue is midline without atrophy or fasciculations.  Motor: Tone is normal. Bulk is normal. 5/5 strength was present in all four extremities. Finger tapping intact bilaterally Sensory: Sensation is symmetric to light touch and temperature in the arms and legs. Cerebellar: FNF and HKS are intact bilaterally Gait:  Deferred  Medications  Current Facility-Administered Medications:    acetaminophen  (TYLENOL ) tablet 650 mg, 650 mg, Oral, Q6H PRN **OR** acetaminophen  (TYLENOL )  suppository 650 mg, 650 mg, Rectal, Q6H PRN, Opyd, Santana Cue, MD   amLODipine  (NORVASC ) tablet 10 mg, 10 mg, Oral, Daily, Unk Garb, DO   aspirin  EC tablet 81 mg, 81 mg, Oral, Daily, Birdella Sippel L, MD, 81 mg at 01/13/24 1018   atorvastatin  (LIPITOR) tablet 40 mg, 40 mg, Oral, QHS, Jaquala Fuller L, MD   ondansetron (ZOFRAN) tablet 4 mg, 4 mg, Oral, Q6H PRN **OR** ondansetron (ZOFRAN) injection 4 mg, 4 mg, Intravenous, Q6H PRN, Opyd, Santana Cue, MD   oxymetazoline  (AFRIN) 0.05 % nasal spray 1 spray, 1 spray, Right Nare, BID PRN, Opyd, Timothy S, MD   pantoprazole (PROTONIX) EC tablet 40 mg, 40 mg, Oral, BID, Scheryl Cushing, RPH, 40 mg at 01/13/24 1018   potassium chloride  SA (KLOR-CON  M) CR tablet 40 mEq, 40 mEq, Oral, Once, Unk Garb, DO   sodium chloride  flush (NS) 0.9 % injection 3 mL, 3 mL, Intravenous, Q12H, Opyd, Timothy S, MD, 3 mL at 01/13/24 1018  Labs and Diagnostic Imaging   CBC:  Recent Labs  Lab 01/13/24 0526 01/13/24 1026  WBC 10.4 10.1  HGB 9.7* 9.8*  HCT 30.0* 30.3*  MCV 86.7 86.3  PLT 203 214    Basic Metabolic Panel:  Lab Results  Component Value Date   NA 136 01/13/2024   K 3.2 (L) 01/13/2024   CO2 26 01/13/2024   GLUCOSE 114 (H) 01/13/2024   BUN 17 01/13/2024   CREATININE 1.54 (H) 01/13/2024   CALCIUM  8.4 (L) 01/13/2024  GFRNONAA 60 (L) 01/13/2024   Lipid Panel:  Lab Results  Component Value Date   LDLCALC 64 01/13/2024   HgbA1c:  Lab Results  Component Value Date   HGBA1C 5.1 01/13/2024   Urine Drug Screen:     Component Value Date/Time   LABOPIA NONE DETECTED 10/01/2020 2120   COCAINSCRNUR NONE DETECTED 10/01/2020 2120   LABBENZ NONE DETECTED 10/01/2020 2120   AMPHETMU NONE DETECTED 10/01/2020 2120   THCU POSITIVE (A) 10/01/2020 2120   LABBARB NONE DETECTED 10/01/2020 2120    Alcohol Level     Component Value Date/Time   ETH <10 10/01/2020 2120   INR  Lab Results  Component Value Date   INR 1.1 10/01/2020   APTT  Lab  Results  Component Value Date   APTT 30 10/01/2020   AED levels: No results found for: "PHENYTOIN", "ZONISAMIDE", "LAMOTRIGINE", "LEVETIRACETA"  CT Head without contrast(Personally reviewed): Old right basal ganglia lacunar infarcts. Worsening patchy low-density areas throughout the periventricular and deep white matter. Differential considerations would include chronic microvascular ischemic disease, vasculitis, or other demyelinating disorder such as MS. This could be further evaluated with MRI if felt clinically indicated.   MRI Brain(Personally reviewed): Acute small-vessel infarct in the right centrum semiovale. Chronic small vessel disease that is progressed from 2022, presence of chronic lacunar infarcts and tortuous intracranial vessels compatible with chronic small vessel disease/hypertension.   MRA Brain and neck No evidence of significant stenosis, aneurysmal dilatation, or dissection involving the arteries of the head. Normal mra of the neck   Neurodiagnostics rEEG:  This study is within normal limits. No seizures or epileptiform discharges were seen throughout the recording. A normal interictal EEG does not exclude the diagnosis of epilepsy.  Assessment   William Cantu is a 38 y.o. male  presenting with syncope he attributes to feeling unwell secondary to swallowing a lot of blood from epistaxis, found to have an acute stroke.   Recommendations  # Right centrum semiovale stroke, likely small vessel disease but consider embolic etiology given syncope on presentation and size - Frequent neuro checks per protocol - Echocardiogram - Prophylactic therapy-Antiplatelet med: Aspirin  81mg  and brilinta 90mg  BID for 21 days when epistaxis allows and then aspirin  alone  - Risk factor modification, smoking cessation discussed with patient, as well as sleep apnea eval, medication adherence, diet and exercise  - 30 day heart monitor requested - Blood pressure goal --  normotension - PT consult, OT consult, Speech consult, not needed as patient is back to baseline - Given extensive prior workup for stroke of the young that was negative, as well as uncontrolled small vessel risk factors, do not feel that there is benefit to repeating the extensive workup at this time, but the patient will follow-up closely with Dr. Janett Medin outpatient - Appreciate medical workup for etiologies of syncope per primary team and management of comorbidities per primary team - Inpatient neurology will sign off at this time, please reach out with any additional questions or concerns  ______________________________________________________________________   Signed, Imogene Mana, NP [facility]  Attending Neurologist's note:  I personally saw this patient, gathering history, performing a full neurologic examination, reviewing relevant labs, personally reviewing relevant imaging including MRA brain and neck, and formulated the assessment and plan, adding the note above for completeness and clarity to accurately reflect my thoughts  Baldwin Levee MD-PhD [facility] [phone] Available 7 AM to 7 PM, outside these hours please contact Neurologist on call listed on AMION

## 2024-01-13 NOTE — Plan of Care (Signed)
  Problem: Education: Goal: Knowledge of condition and prescribed therapy will improve Outcome: Progressing   Problem: Cardiac: Goal: Will achieve and/or maintain adequate cardiac output Outcome: Progressing   Problem: Physical Regulation: Goal: Complications related to the disease process, condition or treatment will be avoided or minimized Outcome: Progressing   Problem: Education: Goal: Knowledge of General Education information will improve Description: Including pain rating scale, medication(s)/side effects and non-pharmacologic comfort measures Outcome: Progressing   Problem: Health Behavior/Discharge Planning: Goal: Ability to manage health-related needs will improve Outcome: Progressing   Problem: Clinical Measurements: Goal: Ability to maintain clinical measurements within normal limits will improve Outcome: Progressing Goal: Will remain free from infection Outcome: Progressing Goal: Diagnostic test results will improve Outcome: Progressing Goal: Respiratory complications will improve Outcome: Progressing Goal: Cardiovascular complication will be avoided Outcome: Progressing   Problem: Activity: Goal: Risk for activity intolerance will decrease Outcome: Progressing   Problem: Nutrition: Goal: Adequate nutrition will be maintained Outcome: Progressing   Problem: Coping: Goal: Level of anxiety will decrease Outcome: Progressing   Problem: Elimination: Goal: Will not experience complications related to bowel motility Outcome: Progressing Goal: Will not experience complications related to urinary retention Outcome: Progressing   Problem: Pain Managment: Goal: General experience of comfort will improve and/or be controlled Outcome: Progressing   Problem: Safety: Goal: Ability to remain free from injury will improve Outcome: Progressing   Problem: Skin Integrity: Goal: Risk for impaired skin integrity will decrease Outcome: Progressing   Problem:  Education: Goal: Knowledge of disease or condition will improve Outcome: Progressing Goal: Knowledge of secondary prevention will improve (MUST DOCUMENT ALL) Outcome: Progressing Goal: Knowledge of patient specific risk factors will improve (DELETE if not current risk factor) Outcome: Progressing   Problem: Ischemic Stroke/TIA Tissue Perfusion: Goal: Complications of ischemic stroke/TIA will be minimized Outcome: Progressing   Problem: Coping: Goal: Will verbalize positive feelings about self Outcome: Progressing Goal: Will identify appropriate support needs Outcome: Progressing   Problem: Health Behavior/Discharge Planning: Goal: Ability to manage health-related needs will improve Outcome: Progressing Goal: Goals will be collaboratively established with patient/family Outcome: Progressing   Problem: Self-Care: Goal: Ability to participate in self-care as condition permits will improve Outcome: Progressing Goal: Verbalization of feelings and concerns over difficulty with self-care will improve Outcome: Progressing Goal: Ability to communicate needs accurately will improve Outcome: Progressing   Problem: Nutrition: Goal: Risk of aspiration will decrease Outcome: Progressing Goal: Dietary intake will improve Outcome: Progressing

## 2024-01-13 NOTE — Progress Notes (Addendum)
 PROGRESS NOTE    William Cantu  NWG:[id number] DOB: 10-14-1985 DOA: 01/12/2024 PCP: Program, [facility]  Subjective: Pt seen and examined. Pt seen by GI consult who does not think pt need any further GI evaluation and that his hemoccult positive stool was from his swallowed blood from epistaxis.   His MRI brain however did show an acute CVA of right semiovale.  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 First CVA was when pt was only 38 yo.  Pt states he had ran out of his HTN meds about 1 week ago but recently got them refilled and started taking them again.  Pt's BP was not elevated on admission.  Pt's girlfriend states pt did lose consciousness yesterday in ER.  Pt has transesophageal echo in 2022 that was negative for intra-atrial shunting.  Pt states he cannot take plavix  due to it causing a whole body rash. He does not take ASA on a regular basis.   Hospital Course: HPI: William Cantu is a 38 y.o. male with medical history significant for hypertension and history of CVA, now presenting with epistaxis, melena, and syncope.   Patient reports that he developed epistaxis on 01/06/2024.  This was atraumatic in his first episode of this.  He continued to have intermittent bleeding from his right nare and eventually sought evaluation in the ED on 01/09/2024.  Bleeding had stopped by the time he was seen in the ED, heart rate was normal and blood pressure elevated, and he returned home with as needed Afrin.  He continued to have intermittent epistaxis and melena back at home.  The bleeding usually stops before he uses Afrin, but later starts again.   He has now developed lightheadedness and had multiple episodes of syncope tonight.  He has not experienced any chest pain, palpitations, abdominal pain, or nausea but has had a loss of appetite.  He does not use any antiplatelet or NSAIDs.  He has been taking antihypertensives at home but states that his blood pressure is typically elevated  despite this.    ED Course: Upon arrival to the ED, patient is found to be afebrile and saturating well on room air with normal HR and stable BP.  EKG demonstrates sinus rhythm.  Labs are most notable for potassium 3.0, BUN 36, creatinine 2.16, WBC 24,700, globin 10.9, and positive FOBT.  Head CT reveals old right basal ganglia infarct and progression of low-density areas throughout the periventricular white matter.   ED physician sent a message to GI with request for routine consultation and the patient was given a liter of NS.    Significant Events: Admitted 01/12/2024 for syncope   Significant Labs: WBC 24.7, HgB 10.9, plt 184 Na 137, K 3.0, CO2 of 21, BUN 36, Scr 2.16, glu 122 TP 6.7, alb 3.7, AST 20, ALT 13, alk phos 71, t. Bili 0.9  Significant Imaging Studies: CT head Old right basal ganglia lacunar infarcts.Worsening patchy low-density areas throughout the periventricular and deep white matter. Differential considerations would include chronic microvascular ischemic disease, vasculitis, or other demyelinating disorder such as MS. This could be further evaluated with MRI if felt clinically indicated MRI brain - shows acute CVA right semiovale. EEG negative for seizures. MRA brain/neck negative for stenosis Antibiotic Therapy: Anti-infectives (From admission, onward)    None       Procedures:   Consultants: GI Neurology.    Assessment and Plan: * Syncope 01-12-2024 may have been acute CVA seen on MRI brain from yesterday.. Will ask  neurology to see him.  01-13-2024 most likely due to his CVA.  Will obtain 30 day event recorder. Cardiology will mail him Ziopatch to his home.  Acute CVA (cerebrovascular accident) (HCC) 01-12-2024 had LOC yesterday per pt's girlfriend. Currently pt is asymptomatic. Denies any dysarthria, focal weakness or paraesthesias. Has been up walking to bathroom without difficulty. Has eaten without dysphagia.  01-13-2024 neurology did not think  pt needed PT/OT/ST consult. MRA head/neck negative for stenosis. Echo negative for thrombus.  Given his recent bouts of epistaxis, I think he should only be on ASA 81 mg daily until he has his ENT evaluation to make sure he doesn't need any nasal cautery.  He will f/u with Stroke clinic after his ENT consult and maybe he can be changed to Brilinta and/or ASA. Given this is his 2nd or 3rd CVA at young age of 67, he needs aggressive risk factor reduction.  Continue lipitor 40 mg daily.  Epistaxis 01-12-2024 no hx of prior nosebleeds. Pt denies picking his nose. Denies snorting any illicit substances including cocaine. Will refer to ENT as outpatient to nasal endoscopic eval.  01-13-2024 outpatient referral to ENT for evaluation of epistaxis.  Acute blood loss anemia 01-12-2024 likely due to recurrent epistaxis. No active nose bleed now. He does not need PRBC transfusion. Pt seen by GI and does not need further evaluation.  Pt seen by GI consult who does not think pt need any further GI evaluation and that his hemoccult positive stool was from his swallowed blood from epistaxis.   01-13-2024 HgB 9.8 stable.  Acute kidney injury superimposed on chronic kidney disease (HCC) 01-12-2024 improved with IVF. Baseline scr in 2024 of 1.35.  pt has recently run of his meds and restarted them in the last week. Monitor Scr.  01-13-2024 Scr 1.54. he may need referral as outpatient to nephrology for monitoring. Defer to PCP.  Hypokalemia 01-12-2024 repleted. Resolved.  History of ischemic stroke 01-12-2024 had workup in 2022 with negative TEE.  MRA brain/neck in 2022 showed chronic small vessel ischemic disease. Surprising since pt was only 38 yo at the time.  Essential hypertension 01-12-2024 recently restarted BP meds after running out. He states he got them refilled last week and restarted them.  Given his recent acute CVA, will hold HTN meds until seen by neurology to decided if pt needs permissive HTN or  not.  01-13-2024 restart HTN meds.  Obesity, Class I, BMI 30-34.9 Body mass index is 32.22 kg/m.   DVT prophylaxis: SCDs Start: 01/12/24 0235 SCDs Start: 01/12/24 0233    Code Status: Full Code Family Communication: discussed with pt and his girlfriend at bedside Disposition Plan: return home Reason for continuing need for hospitalization: medically stable for DC.  Objective: Vitals:   01/12/24 2122 01/13/24 0455 01/13/24 0456 01/13/24 1344  BP: (!) 155/102  130/88 (!) 156/102  Pulse: 89  83 87  Resp: 20   20  Temp: 98.7 F (37.1 C)  98.5 F (36.9 C) 98.4 F (36.9 C)  TempSrc: Oral  Oral Oral  SpO2: 99%  100% 100%  Weight:  93.3 kg    Height:        Intake/Output Summary (Last 24 hours) at 01/13/2024 1413 Last data filed at 01/13/2024 1018 Gross per 24 hour  Intake 1682.31 ml  Output --  Net 1682.31 ml   Filed Weights   01/12/24 0523 01/13/24 0455  Weight: 94.4 kg 93.3 kg    Examination:  Physical Exam Vitals and nursing note  reviewed.  Constitutional:      General: He is not in acute distress.    Appearance: He is not toxic-appearing.  HENT:     Head: Normocephalic and atraumatic.     Nose: Nose normal.  Cardiovascular:     Rate and Rhythm: Normal rate and regular rhythm.  Pulmonary:     Effort: Pulmonary effort is normal. No respiratory distress.  Abdominal:     General: There is no distension.  Neurological:     General: No focal deficit present.     Mental Status: He is alert and oriented to person, place, and time.     Data Reviewed: I have personally reviewed following labs and imaging studies  CBC: Recent Labs  Lab 01/12/24 0021 01/12/24 0953 01/12/24 1729 01/13/24 0526 01/13/24 1026  WBC 24.7* 14.4* 13.4* 10.4 10.1  HGB 10.9* 9.7* 9.8* 9.7* 9.8*  HCT 33.1* 29.1* 30.0* 30.0* 30.3*  MCV 87.8 85.3 87.2 86.7 86.3  PLT 184 193 193 203 214   Basic Metabolic Panel: Recent Labs  Lab 01/12/24 0021 01/12/24 0952 01/13/24 0526  NA  137 136 136  K 3.0* 3.8 3.2*  CL 107 106 102  CO2 21* 23 26  GLUCOSE 122* 90 114*  BUN 36* 25* 17  CREATININE 2.16* 1.49* 1.54*  CALCIUM  8.5* 8.3* 8.4*   GFR: Estimated Creatinine Clearance: 72.2 mL/min (A) (by C-G formula based on SCr of 1.54 mg/dL (H)). Liver Function Tests: Recent Labs  Lab 01/12/24 0021  AST 20  ALT 13  ALKPHOS 71  BILITOT 0.9  PROT 6.7  ALBUMIN 3.7   HbA1C: Recent Labs    01/13/24 0526  HGBA1C 5.1   CBG: Recent Labs  Lab 01/12/24 0549 01/13/24 0445  GLUCAP 117* 109*   Lipid Profile: Recent Labs    01/13/24 0526  CHOL 120  HDL 24*  LDLCALC 64  TRIG 295*  CHOLHDL 5.0   Radiology Studies: MR ANGIO NECK W WO CONTRAST Result Date: 01/13/2024 EXAM: MRA Neck without and with contrast 01/13/2024 09:00:02 AM TECHNIQUE: Multiplanar multisequence MRA of the neck was performed without and with the administration of 9 mL intravenous gadobutrol  (GADAVIST ) 1 MMOL/ML. COMPARISON: None available CLINICAL HISTORY: Carotid artery stenosis screening, risk factors. MRA Head and Neck w/wo: 9 ml Gadavist ; Stroke/TIA, determine embolic source FINDINGS: CAROTID ARTERIES: No dissection, arterial injury, or hemodynamically significant stenosis by NASCET criteria. VERTEBRAL ARTERIES: No dissection, arterial injury, or significant stenosis. IMPRESSION: 1. Normal mra of the neck Electronically signed by: Audree Leas MD 01/13/2024 01:42 PM EDT RP Workstation: [id number]   MR ANGIO HEAD WO CONTRAST Result Date: 01/13/2024 EXAM: MR Angiography Head without Intravenous Contrast. CLINICAL HISTORY: Stroke/TIA, determine embolic source. TECHNIQUE: Magnetic resonance angiography images of the head without intravenous contrast. Three-dimensional MIP reformations performed. CONTRAST: Without. 9 mL Gadavist . COMPARISON: MR head without and with contrast 01/12/2024 and CT angio head and neck 10/01/2020. FINDINGS: INTERNAL CAROTID ARTERIES: No significant stenosis. No aneurysm or  AVM. ANTERIOR CEREBRAL ARTERIES: No significant stenosis. No aneurysm or AVM. MIDDLE CEREBRAL ARTERIES: No significant stenosis. No aneurysm or AVM. POSTERIOR CEREBRAL ARTERIES: A fetal-type right posterior cerebral artery is present. No significant stenosis. No aneurysm or AVM. BASILAR ARTERY: No significant stenosis. No aneurysm or AVM. VERTEBRAL ARTERIES: No significant stenosis. No aneurysm or AVM. IMPRESSION: 1. No evidence of significant stenosis, aneurysmal dilatation, or dissection involving the arteries of the head. Electronically signed by: Audree Leas MD 01/13/2024 01:39 PM EDT RP Workstation: [id number]   ECHOCARDIOGRAM COMPLETE Result  Date: 01/13/2024    ECHOCARDIOGRAM REPORT   Patient Name:   DEMPSY DAMIANO Date of Exam: 01/13/2024 Medical Rec #:  [id number]       Height:       67.0 in Accession #:    [id number]      Weight:       205.7 lb Date of Birth:  30-Oct-1985      BSA:          2.046 m Patient Age:    37 years        BP:           130/88 mmHg Patient Gender: M               HR:           82 bpm. Exam Location:  Inpatient Procedure: 2D Echo, Cardiac Doppler, Color Doppler and Saline Contrast Bubble            Study (Both Spectral and Color Flow Doppler were utilized during            procedure). Indications:     Stroke  History:         Patient has prior history of Echocardiogram examinations. TIA;                  Risk Factors:Hypertension and Current Smoker.  Sonographer:     Willey Harrier Referring Phys:  [id number] Amalio Loe Diagnosing Phys: Alwin Baars IMPRESSIONS  1. Left ventricular ejection fraction, by estimation, is 60 to 65%. The left ventricle has normal function. The left ventricle has no regional wall motion abnormalities. Left ventricular diastolic parameters were normal.  2. Right ventricular systolic function is normal. The right ventricular size is normal.  3. The mitral valve is normal in structure. No evidence of mitral valve regurgitation. No evidence of mitral  stenosis.  4. The aortic valve is normal in structure. Aortic valve regurgitation is not visualized. No aortic stenosis is present.  5. Aortic dilatation noted. There is borderline dilatation of the aortic root, measuring 37 mm.  6. The inferior vena cava is normal in size with greater than 50% respiratory variability, suggesting right atrial pressure of 3 mmHg.  7. Agitated saline contrast bubble study was negative, with no evidence of any interatrial shunt. FINDINGS  Left Ventricle: Left ventricular ejection fraction, by estimation, is 60 to 65%. The left ventricle has normal function. The left ventricle has no regional wall motion abnormalities. The left ventricular internal cavity size was normal in size. There is  no left ventricular hypertrophy. Left ventricular diastolic parameters were normal. Right Ventricle: The right ventricular size is normal. No increase in right ventricular wall thickness. Right ventricular systolic function is normal. Left Atrium: Left atrial size was normal in size. Right Atrium: Right atrial size was normal in size. Pericardium: There is no evidence of pericardial effusion. Mitral Valve: The mitral valve is normal in structure. No evidence of mitral valve regurgitation. No evidence of mitral valve stenosis. MV peak gradient, 2.0 mmHg. The mean mitral valve gradient is 1.0 mmHg. Tricuspid Valve: The tricuspid valve is normal in structure. Tricuspid valve regurgitation is not demonstrated. No evidence of tricuspid stenosis. Aortic Valve: The aortic valve is normal in structure. Aortic valve regurgitation is not visualized. No aortic stenosis is present. Aortic valve peak gradient measures 6.6 mmHg. Pulmonic Valve: The pulmonic valve was normal in structure. Pulmonic valve regurgitation is not visualized. No evidence of pulmonic stenosis. Aorta: Aortic dilatation noted. There is  borderline dilatation of the aortic root, measuring 37 mm. Venous: The inferior vena cava is normal in size  with greater than 50% respiratory variability, suggesting right atrial pressure of 3 mmHg. IAS/Shunts: No atrial level shunt detected by color flow Doppler. Agitated saline contrast was given intravenously to evaluate for intracardiac shunting. Agitated saline contrast bubble study was negative, with no evidence of any interatrial shunt.  LEFT VENTRICLE PLAX 2D LVIDd:         4.00 cm   Diastology LVIDs:         2.90 cm   LV e' medial:    6.31 cm/s LV PW:         1.10 cm   LV E/e' medial:  9.7 LV IVS:        1.10 cm   LV e' lateral:   7.62 cm/s LVOT diam:     2.20 cm   LV E/e' lateral: 8.0 LV SV:         70 LV SV Index:   34 LVOT Area:     3.80 cm  RIGHT VENTRICLE             IVC RV S prime:     13.50 cm/s  IVC diam: 1.60 cm TAPSE (M-mode): 2.1 cm LEFT ATRIUM             Index        RIGHT ATRIUM           Index LA Vol (A2C):   38.3 ml 18.71 ml/m  RA Area:     10.80 cm LA Vol (A4C):   30.9 ml 15.10 ml/m  RA Volume:   24.50 ml  11.97 ml/m LA Biplane Vol: 35.2 ml 17.20 ml/m  AORTIC VALVE AV Area (Vmax): 3.09 cm AV Vmax:        128.00 cm/s AV Peak Grad:   6.6 mmHg LVOT Vmax:      104.00 cm/s LVOT Vmean:     77.900 cm/s LVOT VTI:       0.183 m  AORTA Ao Root diam: 3.30 cm Ao Asc diam:  3.70 cm MITRAL VALVE MV Area (PHT): 3.74 cm    SHUNTS MV Area VTI:   3.93 cm    Systemic VTI:  0.18 m MV Peak grad:  2.0 mmHg    Systemic Diam: 2.20 cm MV Mean grad:  1.0 mmHg MV Vmax:       0.71 m/s MV Vmean:      52.3 cm/s MV Decel Time: 203 msec MV E velocity: 61.30 cm/s MV A velocity: 56.55 cm/s MV E/A ratio:  1.08 Aditya Sabharwal Electronically signed by Alwin Baars Signature Date/Time: 01/13/2024/11:37:59 AM    Final (Updated)    EEG adult Result Date: 01/12/2024 Arleene Lack, MD     01/12/2024 11:17 AM Patient Name: Gregary Blackard MRN: [id number] Epilepsy Attending: Arleene Lack Referring Physician/Provider: Walton Guppy, MD Date: 01/12/2024 Duration: 23.23 mins Patient history: 38yo F with syncope. EEG to  evaluate for seizure Level of alertness: Awake AEDs during EEG study: None Technical aspects: This EEG study was done with scalp electrodes positioned according to the 10-20 International system of electrode placement. Electrical activity was reviewed with band pass filter of 1-70Hz , sensitivity of 7 uV/mm, display speed of 26mm/sec with a 60Hz  notched filter applied as appropriate. EEG data were recorded continuously and digitally stored.  Video monitoring was available and reviewed as appropriate. Description: The posterior dominant rhythm consists of 9 Hz activity of  moderate voltage (25-35 uV) seen predominantly in posterior head regions, symmetric and reactive to eye opening and eye closing. Physiologic photic driving was not seen during photic stimulation.  Hyperventilation was not performed.   IMPRESSION: This study is within normal limits. No seizures or epileptiform discharges were seen throughout the recording. A normal interictal EEG does not exclude the diagnosis of epilepsy. Arleene Lack   MR BRAIN W WO CONTRAST Result Date: 01/12/2024 CLINICAL DATA:  Stroke follow-up, concern for vasculitis or demyelinating disease by CT. EXAM: MRI HEAD WITHOUT AND WITH CONTRAST TECHNIQUE: Multiplanar, multiecho pulse sequences of the brain and surrounding structures were obtained without and with intravenous contrast. CONTRAST:  10mL GADAVIST  GADOBUTROL  1 MMOL/ML IV SOLN COMPARISON:  Head CT from earlier today.  Brain MRI 10/02/2020 FINDINGS: Brain: Wedge of restricted diffusion in the right centrum semiovale. There is a background of patchy FLAIR hyperintensity scattered in the cerebral white matter without specific pattern. Lacunar-type spaces scattered in the cerebral white matter. No acute hemorrhage, hydrocephalus, mass, or collection. Vascular: Tortuous intracranial vessels with which also appear elongated, suspect chronic hypertension. Skull and upper cervical spine: Normal marrow signal. Sinuses/Orbits:  Secretions layer in the maxillary sinuses. Negative orbits. IMPRESSION: Acute small-vessel infarct in the right centrum semiovale. Chronic small vessel disease that is progressed from 2022, presence of chronic lacunar infarcts and tortuous intracranial vessels compatible with chronic small vessel disease/hypertension. Electronically Signed   By: Ronnette Coke M.D.   On: 01/12/2024 07:53   CT Head Wo Contrast Result Date: 01/12/2024 CLINICAL DATA:  Seizure, new-onset, no history of trauma.  Syncope EXAM: CT HEAD WITHOUT CONTRAST TECHNIQUE: Contiguous axial images were obtained from the base of the skull through the vertex without intravenous contrast. RADIATION DOSE REDUCTION: This exam was performed according to the departmental dose-optimization program which includes automated exposure control, adjustment of the mA and/or kV according to patient size and/or use of iterative reconstruction technique. COMPARISON:  10/02/2020 FINDINGS: Brain: There are multiple low-density areas in the periventricular and deep white matter bilaterally, right greater than left. These have progressed since prior study. Lacunar infarcts noted in the right basal ganglia as seen on prior MRI. No hemorrhage or hydrocephalus. No acute infarction. Vascular: No hyperdense vessel or unexpected calcification. Skull: No acute calvarial abnormality. Sinuses/Orbits: Mucosal thickening throughout the paranasal sinuses. No air-fluid levels. Other: None IMPRESSION: Old right basal ganglia lacunar infarcts. Worsening patchy low-density areas throughout the periventricular and deep white matter. Differential considerations would include chronic microvascular ischemic disease, vasculitis, or other demyelinating disorder such as MS. This could be further evaluated with MRI if felt clinically indicated. Electronically Signed   By: Janeece Mechanic M.D.   On: 01/12/2024 01:46    Scheduled Meds:  amLODipine   10 mg Oral Daily   aspirin  EC  81 mg Oral  Daily   atorvastatin   40 mg Oral QHS   pantoprazole  40 mg Oral BID   sodium chloride  flush  3 mL Intravenous Q12H   Continuous Infusions:   LOS: 0 days   Time spent: 50 minutes  Unk Garb, DO  [facility]  01/13/2024, 2:13 PM

## 2024-01-13 NOTE — Discharge Summary (Signed)
 [facility] Phys[facility] Discharge Summary   Patient name: William Cantu  Admit date:     01/12/2024  Discharge date: 01/13/2024  Attending Physician: Walton Guppy [[id number]]  Discharge Physician: Unk Garb   PCP: Marius Siemens, NP  Admitted From: Home  Disposition:  Home  Recommendations for Outpatient Follow-up:  Follow up with PCP in 1-2 weeks Ambulatory referral to ENT for epistaxis Ambulatory referral to Stroke Clinic 30 days cardiac event recorder will be mailed to patient.  Home Health:No Equipment/Devices: None    Discharge Condition:Stable CODE STATUS:FULL Diet recommendation: Heart Healthy Fluid Restriction: None  Hospital Summary: HPI: William Cantu is a 38 y.o. male with medical history significant for hypertension and history of CVA, now presenting with epistaxis, melena, and syncope.   Patient reports that he developed epistaxis on 01/06/2024.  This was atraumatic in his first episode of this.  He continued to have intermittent bleeding from his right nare and eventually sought evaluation in the ED on 01/09/2024.  Bleeding had stopped by the time he was seen in the ED, heart rate was normal and blood pressure elevated, and he returned home with as needed Afrin.  He continued to have intermittent epistaxis and melena back at home.  The bleeding usually stops before he uses Afrin, but later starts again.   He has now developed lightheadedness and had multiple episodes of syncope tonight.  He has not experienced any chest pain, palpitations, abdominal pain, or nausea but has had a loss of appetite.  He does not use any antiplatelet or NSAIDs.  He has been taking antihypertensives at home but states that his blood pressure is typically elevated despite this.    ED Course: Upon arrival to the ED, patient is found to be afebrile and saturating well on room air with normal HR and stable BP.  EKG demonstrates sinus rhythm.  Labs are most notable for potassium  3.0, BUN 36, creatinine 2.16, WBC 24,700, globin 10.9, and positive FOBT.  Head CT reveals old right basal ganglia infarct and progression of low-density areas throughout the periventricular white matter.   ED physician sent a message to GI with request for routine consultation and the patient was given a liter of NS.    Significant Events: Admitted 01/12/2024 for syncope   Significant Labs: WBC 24.7, HgB 10.9, plt 184 Na 137, K 3.0, CO2 of 21, BUN 36, Scr 2.16, glu 122 TP 6.7, alb 3.7, AST 20, ALT 13, alk phos 71, t. Bili 0.9  Significant Imaging Studies: CT head Old right basal ganglia lacunar infarcts.Worsening patchy low-density areas throughout the periventricular and deep white matter. Differential considerations would include chronic microvascular ischemic disease, vasculitis, or other demyelinating disorder such as MS. This could be further evaluated with MRI if felt clinically indicated MRI brain - shows acute CVA right semiovale. EEG negative for seizures. MRA brain/neck negative for stenosis Echo negative for thrombus. LVEF 60%. No intra-cardiac shunt  Antibiotic Therapy: Anti-infectives (From admission, onward)    None       Procedures:   Consultants: GI Neurology.   Hospital Course by Problem: * Syncope 01-12-2024 may have been acute CVA seen on MRI brain from yesterday.. Will ask neurology to see him.  01-13-2024 most likely due to his CVA.  Will obtain 30 day event recorder. Cardiology will mail him Ziop[employer] to his home.  Acute CVA (cerebrovascular accident) (HCC) 01-12-2024 had LOC yesterday per pt's girlfriend. Currently pt is asymptomatic. Denies any dysarthria, focal weakness or paraesthesias. Has  been up walking to bathroom without difficulty. Has eaten without dysphagia.  01-13-2024 neurology did not think pt needed PT/OT/ST consult. MRA head/neck negative for stenosis. Echo negative for thrombus.  Given his recent bouts of epistaxis, I think he  should only be on ASA 81 mg daily until he has his ENT evaluation to make sure he doesn't need any nasal cautery.  He will f/u with Stroke clinic after his ENT consult and maybe he can be changed to Brilinta and/or ASA. Given this is his 2nd or 3rd CVA at young age of 66, he needs aggressive risk factor reduction.  Continue lipitor 40 mg daily.  Epistaxis 01-12-2024 no hx of prior nosebleeds. Pt denies picking his nose. Denies snorting any illicit substances including cocaine. Will refer to ENT as outpatient to nasal endoscopic eval.  01-13-2024 outpatient referral to ENT for evaluation of epistaxis.  Acute blood loss anemia 01-12-2024 likely due to recurrent epistaxis. No active nose bleed now. He does not need PRBC transfusion. Pt seen by GI and does not need further evaluation.  Pt seen by GI consult who does not think pt need any further GI evaluation and that his hemoccult positive stool was from his swallowed blood from epistaxis.   01-13-2024 HgB 9.8 stable.  Acute kidney injury superimposed on chronic kidney disease (HCC) 01-12-2024 improved with IVF. Baseline scr in 2024 of 1.35.  pt has recently run of his meds and restarted them in the last week. Monitor Scr.  01-13-2024 Scr 1.54. he may need referral as outpatient to nephrology for monitoring. Defer to PCP.  Hypokalemia 01-12-2024 repleted. Resolved.  History of ischemic stroke 01-12-2024 had workup in 2022 with negative TEE.  MRA brain/neck in 2022 showed chronic small vessel ischemic disease. Surprising since pt was only 38 yo at the time.  Essential hypertension 01-12-2024 recently restarted BP meds after running out. He states he got them refilled last week and restarted them.  Given his recent acute CVA, will hold HTN meds until seen by neurology to decided if pt needs permissive HTN or not.  01-13-2024 restart HTN meds.  Obesity, Class I, BMI 30-34.9 Body mass index is 32.22 kg/m.     Discharge Diagnoses:   Principal Problem:   Syncope Active Problems:   Acute CVA (cerebrovascular accident) (HCC)   Acute kidney injury superimposed on chronic kidney disease (HCC)   Acute blood loss anemia   Epistaxis   Essential hypertension   History of ischemic stroke   Hypokalemia   Obesity, Class I, BMI 30-34.9   Discharge Instructions  Discharge Instructions     Ambulatory referral to ENT   Complete by: As directed    Recurrent epistaxis   Ambulatory referral to Neurology   Complete by: As directed    An appointment is requested in approximately: 2 weeks. Indication: stroke.   Call MD for:  difficulty breathing, headache or visual disturbances   Complete by: As directed    Call MD for:  extreme fatigue   Complete by: As directed    Call MD for:  hives   Complete by: As directed    Call MD for:  persistant dizziness or light-headedness   Complete by: As directed    Call MD for:  persistant nausea and vomiting   Complete by: As directed    Call MD for:  redness, tenderness, or signs of infection (pain, swelling, redness, odor or green/yellow discharge around incision site)   Complete by: As directed    Call MD  for:  severe uncontrolled pain   Complete by: As directed    Call MD for:  temperature >100.4   Complete by: As directed    Diet - low sodium heart healthy   Complete by: As directed    Discharge instructions   Complete by: As directed    1. Follow up with your primary care provider in 1-2 weeks following discharge from hospital. 2. Ambulatory referral made to Ear, Nose and Throat specialist to see you. They will call you with appointment.   Increase activity slowly   Complete by: As directed       Allergies as of 01/13/2024       Reactions   Plavix  [clopidogrel ] Rash        Medication List     STOP taking these medications    acetaminophen  325 MG tablet Commonly known as: Tylenol    ibuprofen  200 MG tablet Commonly known as: Advil    NASAL DECONGESTANT SINUS  PO   oxyCODONE  5 MG immediate release tablet Commonly known as: Roxicodone        TAKE these medications    amLODipine  10 MG tablet Commonly known as: NORVASC  Take 1 tablet (10 mg total) by mouth daily.   aspirin  EC 81 MG tablet Take 1 tablet (81 mg total) by mouth daily. Swallow whole. Start taking on: Jan 14, 2024   atorvastatin  40 MG tablet Commonly known as: LIPITOR Take 1 tablet (40 mg total) by mouth daily. What changed: Another medication with the same name was removed. Continue taking this medication, and follow the directions you see here.   carvedilol  12.5 MG tablet Commonly known as: COREG  TAKE 1 TABLET (12.5 MG TOTAL) BY MOUTH 2 (TWO) TIMES DAILY WITH A MEAL.   losartan -hydrochlorothiazide  100-25 MG tablet Commonly known as: HYZAAR TAKE 1 TABLET BY MOUTH DAILY.   oxymetazoline  0.05 % nasal spray Commonly known as: AFRIN Place 1 spray into right nostril 2 (two) times daily as needed (epistaxis).        Follow-up Information     Program, [facility] .   Contact information: [address] [place] [address] [phone]         [facility] Follow up in 2 week(s).   Contact information: [facility] [place]  [address] [phone]               Allergies  Allergen Reactions   Plavix  [Clopidogrel ] Rash    Discharge Exam: Vitals:   01/13/24 0456 01/13/24 1344  BP: 130/88 (!) 156/102  Pulse: 83 87  Resp:  20  Temp: 98.5 F (36.9 C) 98.4 F (36.9 C)  SpO2: 100% 100%    Physical Exam Vitals and nursing note reviewed.  Constitutional:      General: He is not in acute distress.    Appearance: He is not toxic-appearing.  HENT:     Head: Normocephalic and atraumatic.     Nose: Nose normal.  Cardiovascular:     Rate and Rhythm: Normal rate and regular rhythm.  Pulmonary:     Effort: Pulmonary effort is normal. No respiratory  distress.  Abdominal:     General: There is no distension.  Neurological:     General: No focal deficit present.     Mental Status: He is alert and oriented to person, place, and time.     The results of significant diagnostics from this hospitalization (including imaging, microbiology, ancillary and laboratory) are listed below  for reference.     Labs:  Basic Metabolic Panel: Recent Labs  Lab 01/12/24 0021 01/12/24 0952 01/13/24 0526  NA 137 136 136  K 3.0* 3.8 3.2*  CL 107 106 102  CO2 21* 23 26  GLUCOSE 122* 90 114*  BUN 36* 25* 17  CREATININE 2.16* 1.49* 1.54*  CALCIUM  8.5* 8.3* 8.4*   Liver Function Tests: Recent Labs  Lab 01/12/24 0021  AST 20  ALT 13  ALKPHOS 71  BILITOT 0.9  PROT 6.7  ALBUMIN 3.7   CBC: Recent Labs  Lab 01/12/24 0021 01/12/24 0953 01/12/24 1729 01/13/24 0526 01/13/24 1026  WBC 24.7* 14.4* 13.4* 10.4 10.1  HGB 10.9* 9.7* 9.8* 9.7* 9.8*  HCT 33.1* 29.1* 30.0* 30.0* 30.3*  MCV 87.8 85.3 87.2 86.7 86.3  PLT 184 193 193 203 214   CBG: Recent Labs  Lab 01/12/24 0549 01/13/24 0445  GLUCAP 117* 109*   Hgb A1c Recent Labs    01/13/24 0526  HGBA1C 5.1   Lipid Profile Recent Labs    01/13/24 0526  CHOL 120  HDL 24*  LDLCALC 64  TRIG 841*  CHOLHDL 5.0   Urinalysis    Component Value Date/Time   COLORURINE YELLOW 01/06/2023 1726   APPEARANCEUR CLEAR 01/06/2023 1726   LABSPEC >=1.030 01/06/2023 1726   PHURINE 6.0 01/06/2023 1726   GLUCOSEU NEGATIVE 01/06/2023 1726   HGBUR TRACE (A) 01/06/2023 1726   BILIRUBINUR SMALL (A) 01/06/2023 1726   KETONESUR 15 (A) 01/06/2023 1726   PROTEINUR 30 (A) 01/06/2023 1726   NITRITE NEGATIVE 01/06/2023 1726   LEUKOCYTESUR NEGATIVE 01/06/2023 1726   Sepsis Labs Recent Labs  Lab 01/12/24 0953 01/12/24 1729 01/13/24 0526 01/13/24 1026  WBC 14.4* 13.4* 10.4 10.1    Procedures/Studies: MR ANGIO NECK W WO CONTRAST Result Date: 01/13/2024 EXAM: MRA Neck without and with  contrast 01/13/2024 09:00:02 AM TECHNIQUE: Multiplanar multisequence MRA of the neck was performed without and with the administration of 9 mL intravenous gadobutrol  (GADAVIST ) 1 MMOL/ML. COMPARISON: None available CLINICAL HISTORY: Carotid artery stenosis screening, risk factors. MRA Head and Neck w/wo: 9 ml Gadavist ; Stroke/TIA, determine embolic source FINDINGS: CAROTID ARTERIES: No dissection, arterial injury, or hemodynamically significant stenosis by NASCET criteria. VERTEBRAL ARTERIES: No dissection, arterial injury, or significant stenosis. IMPRESSION: 1. Normal mra of the neck Electronically signed by: Audree Leas MD 01/13/2024 01:42 PM EDT RP Workstation: [id number]   MR ANGIO HEAD WO CONTRAST Result Date: 01/13/2024 EXAM: MR Angiography Head without Intravenous Contrast. CLINICAL HISTORY: Stroke/TIA, determine embolic source. TECHNIQUE: Magnetic resonance angiography images of the head without intravenous contrast. Three-dimensional MIP reformations performed. CONTRAST: Without. 9 mL Gadavist . COMPARISON: MR head without and with contrast 01/12/2024 and CT angio head and neck 10/01/2020. FINDINGS: INTERNAL CAROTID ARTERIES: No significant stenosis. No aneurysm or AVM. ANTERIOR CEREBRAL ARTERIES: No significant stenosis. No aneurysm or AVM. MIDDLE CEREBRAL ARTERIES: No significant stenosis. No aneurysm or AVM. POSTERIOR CEREBRAL ARTERIES: A fetal-type right posterior cerebral artery is present. No significant stenosis. No aneurysm or AVM. BASILAR ARTERY: No significant stenosis. No aneurysm or AVM. VERTEBRAL ARTERIES: No significant stenosis. No aneurysm or AVM. IMPRESSION: 1. No evidence of significant stenosis, aneurysmal dilatation, or dissection involving the arteries of the head. Electronically signed by: Audree Leas MD 01/13/2024 01:39 PM EDT RP Workstation: [id number]   ECHOCARDIOGRAM COMPLETE Result Date: 01/13/2024    ECHOCARDIOGRAM REPORT   Patient Name:   YOON BARCA  Date of Exam: 01/13/2024 Medical Rec #:  [id number]  Height:       67.0 in Accession #:    [id number]      Weight:       205.7 lb Date of Birth:  [birth year]      BSA:          2.046 m Patient Age:    37 years        BP:           130/88 mmHg Patient Gender: M               HR:           82 bpm. Exam Location:  Inpatient Procedure: 2D Echo, Cardiac Doppler, Color Doppler and Saline Contrast Bubble            Study (Both Spectral and Color Flow Doppler were utilized during            procedure). Indications:     Stroke  History:         Patient has prior history of Echocardiogram examinations. TIA;                  Risk Factors:Hypertension and Current Smoker.  Sonographer:     Willey Harrier Referring Phys:  [id number] Keyani Rigdon Diagnosing Phys: Alwin Baars IMPRESSIONS  1. Left ventricular ejection fraction, by estimation, is 60 to 65%. The left ventricle has normal function. The left ventricle has no regional wall motion abnormalities. Left ventricular diastolic parameters were normal.  2. Right ventricular systolic function is normal. The right ventricular size is normal.  3. The mitral valve is normal in structure. No evidence of mitral valve regurgitation. No evidence of mitral stenosis.  4. The aortic valve is normal in structure. Aortic valve regurgitation is not visualized. No aortic stenosis is present.  5. Aortic dilatation noted. There is borderline dilatation of the aortic root, measuring 37 mm.  6. The inferior vena cava is normal in size with greater than 50% respiratory variability, suggesting right atrial pressure of 3 mmHg.  7. Agitated saline contrast bubble study was negative, with no evidence of any interatrial shunt. FINDINGS  Left Ventricle: Left ventricular ejection fraction, by estimation, is 60 to 65%. The left ventricle has normal function. The left ventricle has no regional wall motion abnormalities. The left ventricular internal cavity size was normal in size. There is  no left  ventricular hypertrophy. Left ventricular diastolic parameters were normal. Right Ventricle: The right ventricular size is normal. No increase in right ventricular wall thickness. Right ventricular systolic function is normal. Left Atrium: Left atrial size was normal in size. Right Atrium: Right atrial size was normal in size. Pericardium: There is no evidence of pericardial effusion. Mitral Valve: The mitral valve is normal in structure. No evidence of mitral valve regurgitation. No evidence of mitral valve stenosis. MV peak gradient, 2.0 mmHg. The mean mitral valve gradient is 1.0 mmHg. Tricuspid Valve: The tricuspid valve is normal in structure. Tricuspid valve regurgitation is not demonstrated. No evidence of tricuspid stenosis. Aortic Valve: The aortic valve is normal in structure. Aortic valve regurgitation is not visualized. No aortic stenosis is present. Aortic valve peak gradient measures 6.6 mmHg. Pulmonic Valve: The pulmonic valve was normal in structure. Pulmonic valve regurgitation is not visualized. No evidence of pulmonic stenosis. Aorta: Aortic dilatation noted. There is borderline dilatation of the aortic root, measuring 37 mm. Venous: The inferior vena cava is normal in size with greater than 50% respiratory variability, suggesting right atrial pressure of 3  mmHg. IAS/Shunts: No atrial level shunt detected by color flow Doppler. Agitated saline contrast was given intravenously to evaluate for intracardiac shunting. Agitated saline contrast bubble study was negative, with no evidence of any interatrial shunt.  LEFT VENTRICLE PLAX 2D LVIDd:         4.00 cm   Diastology LVIDs:         2.90 cm   LV e' medial:    6.31 cm/s LV PW:         1.10 cm   LV E/e' medial:  9.7 LV IVS:        1.10 cm   LV e' lateral:   7.62 cm/s LVOT diam:     2.20 cm   LV E/e' lateral: 8.0 LV SV:         70 LV SV Index:   34 LVOT Area:     3.80 cm  RIGHT VENTRICLE             IVC RV S prime:     13.50 cm/s  IVC diam: 1.60 cm  TAPSE (M-mode): 2.1 cm LEFT ATRIUM             Index        RIGHT ATRIUM           Index LA Vol (A2C):   38.3 ml 18.71 ml/m  RA Area:     10.80 cm LA Vol (A4C):   30.9 ml 15.10 ml/m  RA Volume:   24.50 ml  11.97 ml/m LA Biplane Vol: 35.2 ml 17.20 ml/m  AORTIC VALVE AV Area (Vmax): 3.09 cm AV Vmax:        128.00 cm/s AV Peak Grad:   6.6 mmHg LVOT Vmax:      104.00 cm/s LVOT Vmean:     77.900 cm/s LVOT VTI:       0.183 m  AORTA Ao Root diam: 3.30 cm Ao Asc diam:  3.70 cm MITRAL VALVE MV Area (PHT): 3.74 cm    SHUNTS MV Area VTI:   3.93 cm    Systemic VTI:  0.18 m MV Peak grad:  2.0 mmHg    Systemic Diam: 2.20 cm MV Mean grad:  1.0 mmHg MV Vmax:       0.71 m/s MV Vmean:      52.3 cm/s MV Decel Time: 203 msec MV E velocity: 61.30 cm/s MV A velocity: 56.55 cm/s MV E/A ratio:  1.08 Aditya Sabharwal Electronically signed by Alwin Baars Signature Date/Time: 01/13/2024/11:37:59 AM    Final (Updated)    EEG adult Result Date: 01/12/2024 Arleene Lack, MD     01/12/2024 11:17 AM Patient Name: Sequoia Mincey MRN: [id number] Epilepsy Attending: Arleene Lack Referring Physician/Provider: Walton Guppy, MD Date: 01/12/2024 Duration: 23.23 mins Patient history: 38yo F with syncope. EEG to evaluate for seizure Level of alertness: Awake AEDs during EEG study: None Technical aspects: This EEG study was done with scalp electrodes positioned according to the 10-20 International system of electrode placement. Electrical activity was reviewed with band pass filter of 1-70Hz , sensitivity of 7 uV/mm, display speed of 18mm/sec with a 60Hz  notched filter applied as appropriate. EEG data were recorded continuously and digitally stored.  Video monitoring was available and reviewed as appropriate. Description: The posterior dominant rhythm consists of 9 Hz activity of moderate voltage (25-35 uV) seen predominantly in posterior head regions, symmetric and reactive to eye opening and eye closing. Physiologic photic driving  was not seen during photic stimulation.  Hyperventilation  was not performed.   IMPRESSION: This study is within normal limits. No seizures or epileptiform discharges were seen throughout the recording. A normal interictal EEG does not exclude the diagnosis of epilepsy. Arleene Lack   MR BRAIN W WO CONTRAST Result Date: 01/12/2024 CLINICAL DATA:  Stroke follow-up, concern for vasculitis or demyelinating disease by CT. EXAM: MRI HEAD WITHOUT AND WITH CONTRAST TECHNIQUE: Multiplanar, multiecho pulse sequences of the brain and surrounding structures were obtained without and with intravenous contrast. CONTRAST:  10mL GADAVIST  GADOBUTROL  1 MMOL/ML IV SOLN COMPARISON:  Head CT from earlier today.  Brain MRI 10/02/2020 FINDINGS: Brain: Wedge of restricted diffusion in the right centrum semiovale. There is a background of patchy FLAIR hyperintensity scattered in the cerebral white matter without specific pattern. Lacunar-type spaces scattered in the cerebral white matter. No acute hemorrhage, hydrocephalus, mass, or collection. Vascular: Tortuous intracranial vessels with which also appear elongated, suspect chronic hypertension. Skull and upper cervical spine: Normal marrow signal. Sinuses/Orbits: Secretions layer in the maxillary sinuses. Negative orbits. IMPRESSION: Acute small-vessel infarct in the right centrum semiovale. Chronic small vessel disease that is progressed from 2022, presence of chronic lacunar infarcts and tortuous intracranial vessels compatible with chronic small vessel disease/hypertension. Electronically Signed   By: Ronnette Coke M.D.   On: 01/12/2024 07:53   CT Head Wo Contrast Result Date: 01/12/2024 CLINICAL DATA:  Seizure, new-onset, no history of trauma.  Syncope EXAM: CT HEAD WITHOUT CONTRAST TECHNIQUE: Contiguous axial images were obtained from the base of the skull through the vertex without intravenous contrast. RADIATION DOSE REDUCTION: This exam was performed according to the  departmental dose-optimization program which includes automated exposure control, adjustment of the mA and/or kV according to patient size and/or use of iterative reconstruction technique. COMPARISON:  10/02/2020 FINDINGS: Brain: There are multiple low-density areas in the periventricular and deep white matter bilaterally, right greater than left. These have progressed since prior study. Lacunar infarcts noted in the right basal ganglia as seen on prior MRI. No hemorrhage or hydrocephalus. No acute infarction. Vascular: No hyperdense vessel or unexpected calcification. Skull: No acute calvarial abnormality. Sinuses/Orbits: Mucosal thickening throughout the paranasal sinuses. No air-fluid levels. Other: None IMPRESSION: Old right basal ganglia lacunar infarcts. Worsening patchy low-density areas throughout the periventricular and deep white matter. Differential considerations would include chronic microvascular ischemic disease, vasculitis, or other demyelinating disorder such as MS. This could be further evaluated with MRI if felt clinically indicated. Electronically Signed   By: Janeece Mechanic M.D.   On: 01/12/2024 01:46    Time coordinating discharge: 60 mins  SIGNED:  Unk Garb, DO [facility] 01/13/24, 2:18 PM

## 2024-01-13 NOTE — Assessment & Plan Note (Signed)
Body mass index is 32.22 kg/m².

## 2024-01-13 NOTE — Progress Notes (Signed)
 Ordering 30 day cardiac event monitor for CVA to be read by Dr. Filiberto Hug

## 2024-01-14 ENCOUNTER — Encounter (HOSPITAL_COMMUNITY): Payer: Self-pay

## 2024-01-14 ENCOUNTER — Encounter: Payer: Self-pay | Admitting: *Deleted

## 2024-01-14 ENCOUNTER — Emergency Department (HOSPITAL_COMMUNITY)
Admission: EM | Admit: 2024-01-14 | Discharge: 2024-01-14 | Disposition: A | Discharge status: Home / Self Care | Attending: Emergency Medicine | Admitting: Emergency Medicine

## 2024-01-14 ENCOUNTER — Other Ambulatory Visit: Payer: Self-pay

## 2024-01-14 DIAGNOSIS — Z7982 Long term (current) use of aspirin: Secondary | ICD-10-CM | POA: Insufficient documentation

## 2024-01-14 DIAGNOSIS — R040 Epistaxis: Secondary | ICD-10-CM | POA: Diagnosis present

## 2024-01-14 DIAGNOSIS — I10 Essential (primary) hypertension: Secondary | ICD-10-CM | POA: Diagnosis not present

## 2024-01-14 DIAGNOSIS — Z79899 Other long term (current) drug therapy: Secondary | ICD-10-CM | POA: Insufficient documentation

## 2024-01-14 MED ORDER — OXYMETAZOLINE HCL 0.05 % NA SOLN
2.0000 | Freq: Once | NASAL | Status: AC
  Administered 2024-01-14: 2 via NASAL
  Filled 2024-01-14: qty 30

## 2024-01-14 NOTE — ED Triage Notes (Signed)
 Discharged from [facility] inpatient several hours ago.   Says he had not had any nosebleeds while he was here but as soon as he went to bed he woke up bleeding.  Not currently bleeding.

## 2024-01-14 NOTE — ED Provider Notes (Signed)
  EMERGENCY DEPARTMENT AT [facility] Provider Note   CSN: [id number] Arrival date & time: 01/14/24  0303     History  Chief Complaint  Patient presents with   Epistaxis    Jerrad Mendibles is a 38 y.o. male.  The history is provided by the patient and medical records.  Epistaxis  89 old male with history of hypertension, recent stroke for which she was just discharged from hospital yesterday afternoon, presenting to the ED with nosebleed.  Has been having issues with this over the past week, was partially addressed while he was in the hospital.  He has been referred to ENT but this has not yet been set up.  States tonight he was laying in bed, resting and watching TV when nose started bleeding.  States lasted approx 15 mins.  Tried using afrin on cotton ball (which he was instructed to do by doctor in hospital) without relief.  Bleeding seems to have resolved PTA.  He is currently on daily ASA following CVA.  Home Medications Prior to Admission medications   Medication Sig Start Date End Date Taking? Authorizing Provider  amLODipine  (NORVASC ) 10 MG tablet Take 1 tablet (10 mg total) by mouth daily. 01/09/24   Rexie Catena, PA-C  aspirin  EC 81 MG tablet Take 1 tablet (81 mg total) by mouth daily. Swallow whole. 01/14/24 01/13/25  Unk Garb, DO  atorvastatin  (LIPITOR) 40 MG tablet Take 1 tablet (40 mg total) by mouth daily. 01/13/24 04/12/24  Unk Garb, DO  carvedilol  (COREG ) 12.5 MG tablet TAKE 1 TABLET (12.5 MG TOTAL) BY MOUTH 2 (TWO) TIMES DAILY WITH A MEAL. 01/09/24 01/08/25  Franaszek, Amanda, PA-C  losartan -hydrochlorothiazide  (HYZAAR) 100-25 MG tablet TAKE 1 TABLET BY MOUTH DAILY. 01/09/24   Rexie Catena, PA-C  oxymetazoline  (AFRIN) 0.05 % nasal spray Place 1 spray into right nostril 2 (two) times daily as needed (epistaxis). 01/12/24   Unk Garb, DO      Allergies    Plavix  [clopidogrel ]    Review of Systems   Review of Systems  HENT:  Positive for  nosebleeds.   All other systems reviewed and are negative.   Physical Exam Updated Vital Signs BP (!) 156/112 (BP Location: Right Arm)   Pulse 98   Temp 99.3 F (37.4 C) (Oral)   Resp 18   Ht 5\' 7"  (1.702 m)   Wt 93 kg   SpO2 100%   BMI 32.11 kg/m  Physical Exam Vitals and nursing note reviewed.  Constitutional:      Appearance: He is well-developed.  HENT:     Head: Normocephalic and atraumatic.     Nose:     Comments: Dried blood present along medial septal walls bilaterally, no septal hematoma or deformity present, no active epistaxis    Mouth/Throat:     Comments: No blood on posterior oropharynx Eyes:     Conjunctiva/sclera: Conjunctivae normal.     Pupils: Pupils are equal, round, and reactive to light.  Cardiovascular:     Rate and Rhythm: Normal rate and regular rhythm.     Heart sounds: Normal heart sounds.  Pulmonary:     Effort: Pulmonary effort is normal.     Breath sounds: Normal breath sounds.  Musculoskeletal:        General: Normal range of motion.     Cervical back: Normal range of motion.  Skin:    General: Skin is warm and dry.  Neurological:     Mental Status: He is alert  and oriented to person, place, and time.     ED Results / Procedures / Treatments   Labs (all labs ordered are listed, but only abnormal results are displayed) Labs Reviewed - No data to display  EKG None  Radiology  Procedures Procedures    Medications Ordered in ED Medications - No data to display  ED Course/ Medical Decision Making/ A&P                                 Medical Decision Making Risk OTC drugs.   38 year old male presenting to the ED with recurrent epistaxis.  History of same of the past week.  Currently on aspirin  following prior stroke.  Bleeding has resolved prior to my evaluation.  He does have some dried blood along the medial septal walls bilaterally.  There is no septal hematoma or deformity.  Will plan to monitor here to ensure no  recurrence.  4:19 AM Scant amount of bleeding recurred.  Still appears anterior.  Will give afrin and have him hold pressure.  5:55 AM No further bleeding.  Resting comfortably.  Stable for discharge.  We have reviewed at home epistaxis management and given written instructions for same.  ENT follow-up to be arranged.  Return here for new concerns.  Final Clinical Impression(s) / ED Diagnoses Final diagnoses:  Epistaxis    Rx / DC Orders ED Discharge Orders     None         Coretha Dew, PA-C 01/14/24 0555    Kelsey Patricia, MD 01/14/24 [phone]

## 2024-01-14 NOTE — Progress Notes (Signed)
Patient enrolled for [employer]/ [employer] to ship a 30 day cardiac event monitor to his address on file.  Letter with instructions mailed to patient.

## 2024-01-14 NOTE — Discharge Instructions (Signed)
 Should bleeding recur:  Blow nose to expel any clots. Spray afrin into the nostrils (2 sprays each side) Hold pressure for at least 15-20 mins.  Lean forward and avoid leaning back  Follow up with ENT-- they did a formal referral from hospital, hopefully should contact you but I have included their office information as well.

## 2024-01-18 ENCOUNTER — Encounter (INDEPENDENT_AMBULATORY_CARE_PROVIDER_SITE_OTHER): Payer: Self-pay | Admitting: Otolaryngology

## 2024-01-18 ENCOUNTER — Telehealth: Payer: Self-pay

## 2024-01-18 ENCOUNTER — Ambulatory Visit (INDEPENDENT_AMBULATORY_CARE_PROVIDER_SITE_OTHER): Admitting: Otolaryngology

## 2024-01-18 VITALS — BP 178/106 | HR 90 | Ht 67.0 in | Wt 195.0 lb

## 2024-01-18 DIAGNOSIS — R040 Epistaxis: Secondary | ICD-10-CM

## 2024-01-18 NOTE — Transitions of Care (Post Inpatient/ED Visit) (Signed)
   01/18/2024  Name: William Cantu MRN: [id number] DOB: 26-Jul-1986  Today's TOC FU Call Status: Today's TOC FU Call Status:: Unsuccessful Call (1st Attempt) Unsuccessful Call (1st Attempt) Date: 01/18/24  Attempted to reach the patient regarding the most recent Inpatient/ED visit.  Follow Up Plan: Additional outreach attempts will be made to reach the patient to complete the Transitions of Care (Post Inpatient/ED visit) call.   Signature  Burnett Carson, RN

## 2024-01-19 ENCOUNTER — Telehealth: Payer: Self-pay

## 2024-01-19 NOTE — Transitions of Care (Post Inpatient/ED Visit) (Signed)
   01/19/2024  Name: William Cantu MRN: [id number] DOB: [birth year]  Today's TOC FU Call Status: Today's TOC FU Call Status:: Unsuccessful Call (2nd Attempt) Unsuccessful Call (1st Attempt) Date: 01/18/24 Unsuccessful Call (2nd Attempt) Date: 01/19/24  Attempted to reach the patient regarding the most recent Inpatient/ED visit.  Follow Up Plan: Additional outreach attempts will be made to reach the patient to complete the Transitions of Care (Post Inpatient/ED visit) call.   Signature  Burnett Carson, RN

## 2024-01-19 NOTE — Progress Notes (Signed)
 CC: Recurrent right epistaxis  HPI:  William Cantu is a 38 y.o. male who presents today complaining of recurrent right epistaxis for the past 2 weeks.  According to the patient, he has been experiencing near daily bleeding over the past 2 weeks.  The bleeding is both anterior and posterior.  He has a history of hypertension and cerebrovascular accident 2 years ago.  He is not on any blood thinner.  He has no previous ENT surgery.  He also denies any recent facial or nasal trauma.  Past Medical History:  Diagnosis Date   Hypertension    TIA (transient ischemic attack) 2022    Past Surgical History:  Procedure Laterality Date   BUBBLE STUDY  10/04/2020   Procedure: BUBBLE STUDY;  Surgeon: Luana Rumple, MD;  Location: [facility] ENDOSCOPY;  Service: Cardiovascular;;   TEE WITHOUT CARDIOVERSION N/A 10/04/2020   Procedure: TRANSESOPHAGEAL ECHOCARDIOGRAM (TEE);  Surgeon: Luana Rumple, MD;  Location: [facility] ENDOSCOPY;  Service: Cardiovascular;  Laterality: N/A;    Family History  Problem Relation Age of Onset   Hypertension Father     Social History:  reports that he has been smoking cigarettes. He has never used smokeless tobacco. He reports that he does not drink alcohol and does not use drugs.  Allergies:  Allergies  Allergen Reactions   Plavix  [Clopidogrel ] Rash    Prior to Admission medications   Medication Sig Start Date End Date Taking? Authorizing Provider  amLODipine  (NORVASC ) 10 MG tablet Take 1 tablet (10 mg total) by mouth daily. 01/09/24  Yes Rexie Catena, PA-C  aspirin  EC 81 MG tablet Take 1 tablet (81 mg total) by mouth daily. Swallow whole. 01/14/24 01/13/25 Yes Unk Garb, DO  atorvastatin  (LIPITOR) 40 MG tablet Take 1 tablet (40 mg total) by mouth daily. 01/13/24 04/12/24 Yes Unk Garb, DO  carvedilol  (COREG ) 12.5 MG tablet TAKE 1 TABLET (12.5 MG TOTAL) BY MOUTH 2 (TWO) TIMES DAILY WITH A MEAL. 01/09/24 01/08/25 Yes Rexie Catena, PA-C  losartan -hydrochlorothiazide   (HYZAAR) 100-25 MG tablet TAKE 1 TABLET BY MOUTH DAILY. 01/09/24  Yes Rexie Catena, PA-C  oxymetazoline  (AFRIN) 0.05 % nasal spray Place 1 spray into right nostril 2 (two) times daily as needed (epistaxis). 01/12/24  Yes Unk Garb, DO    Blood pressure (!) 178/106, pulse 90, height 5\' 7"  (1.702 m), weight 195 lb (88.5 kg), SpO2 96%. Exam: General: Communicates without difficulty, well nourished, no acute distress. Head: Normocephalic, no evidence injury, no tenderness, facial buttresses intact without stepoff. Face/sinus: No tenderness to palpation and percussion. Facial movement is normal and symmetric. Eyes: PERRL, EOMI. No scleral icterus, conjunctivae clear. Neuro: CN II exam reveals vision grossly intact.  No nystagmus at any point of gaze. Ears: Auricles well formed without lesions.  Ear canals are intact without mass or lesion.  No erythema or edema is appreciated.  The TMs are intact without fluid. Nose: External evaluation reveals normal support and skin without lesions.  Dorsum is intact.  Anterior rhinoscopy reveals hypervascular areas of the right nasal septum.  Oral:  Oral cavity and oropharynx are intact, symmetric, without erythema or edema.  Mucosa is moist without lesions. Neck: Full range of motion without pain.  There is no significant lymphadenopathy.  No masses palpable.  Thyroid bed within normal limits to palpation.  Parotid glands and submandibular glands equal bilaterally without mass.  Trachea is midline. Neuro:  CN 2-12 grossly intact.  Procedure:  Endoscopic control of recurrent right epistaxis. Indication:  Recurrent epistaxis  Description:  The  right nasal cavity is sprayed with topical xylocaine and neo-synephrine.  After adequate anesthesia is achieved, the nasal cavity is examined with a 0 rigid endoscope.  A suction catheter is inserted in parallel with the 0 endoscope, and it is used to suction blood clots from the nasal cavity.  Several hypervascular areas are  noted on the anterior and superior portion of the septum. Active bleeding is noted. A silver nitrate stick is inserted in parallel with the 0 endoscope.  It is used to repeatedly cauterized the hypervascular areas.  Good hemostasis is achieved.  The patient tolerated the procedure well.     Assessment: 1.  Recurrent right epistaxis. 2.  Multiple hypervascular areas are noted on the right anterior and superior nasal septum. 3.  No suspicious mass or lesion is noted.  Plan: 1.  The physical exam findings are reviewed with the patient. 2.  Endoscopic cauterization of the right nasal septum. 3.  Humidifier and nasal ointment as needed. 4.  The patient will return for reevaluation in 4 weeks.  Bethania Schlotzhauer W Maigan Bittinger 01/19/2024, 9:20 AM

## 2024-01-20 ENCOUNTER — Telehealth: Payer: Self-pay

## 2024-01-20 NOTE — Transitions of Care (Post Inpatient/ED Visit) (Signed)
   01/20/2024  Name: William Cantu MRN: [id number] DOB: [birth year]  Today's TOC FU Call Status: Today's TOC FU Call Status:: Unsuccessful Call (3rd Attempt) Unsuccessful Call (1st Attempt) Date: 01/18/24 Unsuccessful Call (2nd Attempt) Date: 01/19/24 Unsuccessful Call (3rd Attempt) Date: 01/20/24  Attempted to reach the patient regarding the most recent Inpatient/ED visit.  Follow Up Plan: No further outreach attempts will be made at this time. We have been unable to contact the patient.  Letter sent to patient requesting he contact [facility] to schedule an appointment as we have not been able to reach him. He was last seen at [facility] in 2022.    Signature Burnett Carson, RN

## 2024-02-17 ENCOUNTER — Ambulatory Visit (INDEPENDENT_AMBULATORY_CARE_PROVIDER_SITE_OTHER): Admitting: Otolaryngology

## 2024-02-29 NOTE — Progress Notes (Deleted)
 Cardiology Office Note:  .   Date:  02/29/2024  ID:  William Cantu, DOB 04/16/1986, MRN [id number] PCP: No primary care provider on file.  [facility] Providers Cardiologist:  None { Click to update primary MD,subspecialty MD or APP then REFRESH:1}   History of Present Illness: .   William Cantu is a 38 y.o. male  with PMHx of CVA (2022, 12/2023), HLD, HTN who reports to [facility] office for follow up on 30-day monitor for CVA.   Last seen in cardiology by [facility] with Dr. Elmira in 11/2020 for new patient consult. Noted uncontrolled HTN and non compliance with HTN medications. At the time patient did not have insurance. Discussed secondary HTN workup after getting insurance and considered ordering renal artery duplex and renin/aldosterone. Refilled HTN and HLD medications. Follow up in 3 months. He has been lost to follow up.   Recent hospitalization 5/21-22/2025 for syncope and epistasis then found to have right centrum semiovale stroke (MRI brain). PT/OT/speech therapy was not needed as patient returned to baseline.  Discharged on ASA 81 mg only until follow-up with ENT for nasal endoscopic eval. After ENT follow up and procedures complete, noted ASA 81 mg and Brilinta  90 mg twice daily for 21 days followed by ASA only (lifelong). Reported recently restarting BP medication after running out. Noted that cardiology will mail  30 day heart monitor and follow up with cardiology for results. Discharged on amlodipine  10 mg daily, ASA 81 mg, Lipitor 40 mg daily, Coreg  12.5 mg BID, Losartan -hydrochlorothiazide  100-25 mg daily.   Present to ED again on 01/14/2024 for nosebleed. Noted nosebleed still seemed anterior. Treated with Afrin and hold pressure.  Bleeding stopped and patient was safe for discharge. Recommended to follow up with ENT as planned. BP was severely elevated at 156/112. Continued on current medication regimen.   He followed up with ENT 01/19/2024  underwent endoscopic cauterization of the right nasal septum. ENT recommended humidifier and nasal ointment. Follow up in 4 weeks.   Today, reports ### and denies ###.  Reports compliance with medications.  Dietary habitats:  Activity level:  Social: Denies tobacco use/Binging ETOH/drug use  Denies any hospitalizations or visits to the emergency department.   HTN BP well-controlled in office:  5/20205 K 3.2; Cr 2.16>1.49>1.54. Continue on amlodipine  10 mg, Coreg  12.5 mg twice daily, losartan -HCTZ 100-25 mg daily  CVA MRA brain/neck in 2022 showed chronic small vessel ischemic disease  MRI brain - shows acute CVA right semiovale  30 day monitor result not released yet. Will call to discuss once finalized.   HLD, LDL goal <75 12/2023 LFT WNL, LDL 64 Continue Lipitor 40 mg daily  ROS: 10 point review of system has been reviewed and considered negative except ones been listed in the HPI.   Studies Reviewed: SABRA   TEE 12/2023 IMPRESSIONS   1. Left ventricular ejection fraction, by estimation, is 60 to 65%. The  left ventricle has normal function. The left ventricle has no regional  wall motion abnormalities. Left ventricular diastolic parameters were  normal.   2. Right ventricular systolic function is normal. The right ventricular  size is normal.   3. The mitral valve is normal in structure. No evidence of mitral valve  regurgitation. No evidence of mitral stenosis.   4. The aortic valve is normal in structure. Aortic valve regurgitation is  not visualized. No aortic stenosis is present.   5. Aortic dilatation noted. There is borderline dilatation of the aortic  root, measuring  37 mm.   6. The inferior vena cava is normal in size with greater than 50%  respiratory variability, suggesting right atrial pressure of 3 mmHg.   7. Agitated saline contrast bubble study was negative, with no evidence  of any interatrial shunt.  Risk Assessment/Calculations:   {Does this patient have  ATRIAL FIBRILLATION?:[phone]} No BP recorded.  {Refresh Note OR Click here to enter BP  :1}***       Physical Exam:   VS:  There were no vitals taken for this visit.   Wt Readings from Last 3 Encounters:  01/18/24 195 lb (88.5 kg)  01/14/24 205 lb (93 kg)  01/13/24 205 lb 11 oz (93.3 kg)    GEN: Well nourished, well developed in no acute distress while sitting in chair.  NECK: No JVD; No carotid bruits CARDIAC: ***RRR, no murmurs, rubs, gallops RESPIRATORY:  Clear to auscultation without rales, wheezing or rhonchi  ABDOMEN: Soft, non-tender, non-distended EXTREMITIES:  No edema; No deformity   ASSESSMENT AND PLAN: .   ***    {Are you ordering a CV Procedure (e.g. stress test, cath, DCCV, TEE, etc)?   Press F2        :[id number]}  Dispo: ***  Signed, Lorette CINDERELLA Kapur, PA-C

## 2024-03-02 ENCOUNTER — Ambulatory Visit: Attending: General Practice | Admitting: Physician Assistant

## 2024-03-30 ENCOUNTER — Ambulatory Visit: Admitting: Neurology

## 2024-03-30 ENCOUNTER — Encounter: Payer: Self-pay | Admitting: Neurology

## 2024-03-30 ENCOUNTER — Other Ambulatory Visit: Payer: Self-pay

## 2024-03-30 VITALS — BP 180/139 | HR 86 | Ht 67.0 in | Wt 219.6 lb

## 2024-03-30 DIAGNOSIS — I6381 Other cerebral infarction due to occlusion or stenosis of small artery: Secondary | ICD-10-CM | POA: Diagnosis not present

## 2024-03-30 DIAGNOSIS — Z8673 Personal history of transient ischemic attack (TIA), and cerebral infarction without residual deficits: Secondary | ICD-10-CM | POA: Diagnosis not present

## 2024-03-30 DIAGNOSIS — I10 Essential (primary) hypertension: Secondary | ICD-10-CM | POA: Diagnosis not present

## 2024-03-30 MED ORDER — AMLODIPINE BESYLATE 10 MG PO TABS
10.0000 mg | ORAL_TABLET | Freq: Every day | ORAL | 2 refills | Status: AC
  Filled 2024-03-30 – 2024-04-12 (×2): qty 30, 30d supply, fill #0

## 2024-03-30 MED ORDER — CARVEDILOL 12.5 MG PO TABS
ORAL_TABLET | Freq: Two times a day (BID) | ORAL | 2 refills | Status: AC
Start: 2024-03-30 — End: 2025-03-30
  Filled 2024-03-30 – 2024-04-12 (×2): qty 60, 30d supply, fill #0

## 2024-03-30 MED ORDER — LOSARTAN POTASSIUM-HCTZ 100-25 MG PO TABS
1.0000 | ORAL_TABLET | Freq: Every day | ORAL | 2 refills | Status: AC
Start: 2024-03-30 — End: ?
  Filled 2024-03-30 – 2024-04-12 (×2): qty 30, 30d supply, fill #0

## 2024-03-30 MED ORDER — ATORVASTATIN CALCIUM 40 MG PO TABS
40.0000 mg | ORAL_TABLET | Freq: Every day | ORAL | 0 refills | Status: AC
  Filled 2024-03-30 – 2024-04-12 (×2): qty 30, 30d supply, fill #0

## 2024-03-30 NOTE — Progress Notes (Signed)
 [facility] [address] [place]. [place] [address] [phone]       OFFICE CONSULT VISIT NOTE  [name] Date of Birth:  1985/11/28 Medical Record Number:  [id number]   Referring MD Camellia Door, DO Reason for referral stroke HPI: William Cantu is a 38 year old African-American male seen today for initial office consultation visit for stroke.  History is obtained from the patient and review of electronic medical records and I personally reviewed pertinent available imaging films in PACS.  He has past medical history only of hypertension. He initially presented on 10/02/2020 with sudden onset of slurred speech and left-sided weakness while talking to his father.  His father noticed a left facial droop.  Patient symptoms resolved after he came to the ED and the left.  When symptoms returned he came back and teleneurology was consulted.  CT head showed old lacunar infarct but no acute abnormality however MRI scan did show a 1 cm acute right basal ganglia infarct.  MRA of the brain and neck showed no large vessel stenosis or occlusion.  LDL cholesterol 104 mg percent.  Echocardiogram showed ejection fraction 55 to 60% with normal left atrial size.  He underwent transesophageal echocardiogram which showed no cardiac source of embolism or PFO or atrial clot.  TCD bubble study was negative for right-to-left shunt.  Hemoglobin A1c was 5.6.  Hypercoagulable panel labs were all negative.  Lower extremity venous Dopplers were negative.  Vitamin B12 was normal.  Homocystine was normal.  RPR was nonreactive.  ANA was negative.  Vascular risk factors found with only hypertension and smoking.  He was advised to take aspirin  Plavix  for 3 weeks and then aspirin  alone.  Patient made full neurological recovery and did very well.  He had cut back smoking but never given up.  His blood pressure had always been difficult to control. He presented again on 01/12/2024 with an episode of not feeling  well, dizzy, nauseated if attributed this to her recent episode of epistaxis that he had had.  He went to the restroom, felt the need to vomit and then subsequently lost consciousness and fell on the floor.  He was witnessed as having shaking on the floor resembling a seizure.  He regained consciousness quite quickly and was able to respond to questions and did not have any headache, confusion or memory loss and any further episodes.  His blood pressure was found to be low.  CT head showed old right basal ganglia lacunar infarct and changes of small vessel disease but no acute abnormality.  MRI scan showed small acute infarct in the right centrum semiovale with changes of chronic small vessel disease that had progressed since 2022.  MRI of the brain and neck showed no large vessel extracranial or intracranial stenosis.  EEG showed no evidence of epileptiform activity.  Patient was discharged on aspirin  and Brilinta  for 4 weeks followed by aspirin  alone.  He states he has done well since discharge.  He has had no further episodes of epistaxis.  He is currently on aspirin  81 mg daily and tolerating it well.  He denies any focal neurological symptoms.  He is cut back smoking but still continues to smoke.  He denies using marijuana cocaine or street drugs.  There is no family history of stroke or myocardial infarction at a young age in his family.  Patient denies any prior history of DVT pulmonary embolism or rashes.  Tolerating Lipitor well without side effects.  His blood pressure still  remains difficult to control despite being on Norvasc , Coreg  and Hyzaar .  Blood pressure today is 180/139 and 194/139 on repeat checking.  He does have an upcoming appointment to see a primary care physician soon next week ROS:   14 system review of systems is positive for dizziness, lightheadedness, syncope, passing out, seizure, nasal bleeding and all other systems negative  PMH:  Past Medical History:  Diagnosis Date    Hypertension    TIA (transient ischemic attack) 2022    Social History:  Social History   Socioeconomic History   Marital status: Single    Spouse name: Not on file   Number of children: 2   Years of education: Not on file   Highest education level: Not on file  Occupational History   Not on file  Tobacco Use   Smoking status: Some Days    Current packs/day: 0.50    Types: Cigars, Cigarettes   Smokeless tobacco: Never  Vaping Use   Vaping status: Never Used  Substance and Sexual Activity   Alcohol use: No   Drug use: No   Sexual activity: Not on file  Other Topics Concern   Not on file  Social History Narrative   Not on file   Social Drivers of Health   Financial Resource Strain: Not on file  Food Insecurity: No Food Insecurity (01/12/2024)   Hunger Vital Sign    Worried About Running Out of Food in the Last Year: Never true    Ran Out of Food in the Last Year: Never true  Transportation Needs: No Transportation Needs (01/12/2024)   PRAPARE - Administrator, Civil Service (Medical): No    Lack of Transportation (Non-Medical): No  Physical Activity: Not on file  Stress: Not on file  Social Connections: Unknown (12/24/2021)   Received from [facility]   Social Network    Social Network: Not on file  Intimate Partner Violence: Not At Risk (01/12/2024)   Humiliation, Afraid, Rape, and Kick questionnaire    Fear of Current or Ex-Partner: No    Emotionally Abused: No    Physically Abused: No    Sexually Abused: No    Medications:   Current Outpatient Medications on File Prior to Visit  Medication Sig Dispense Refill   amLODipine  (NORVASC ) 10 MG tablet Take 1 tablet (10 mg total) by mouth daily. 30 tablet 2   carvedilol  (COREG ) 12.5 MG tablet TAKE 1 TABLET (12.5 MG TOTAL) BY MOUTH 2 (TWO) TIMES DAILY WITH A MEAL. 60 tablet 2   losartan -hydrochlorothiazide  (HYZAAR ) 100-25 MG tablet TAKE 1 TABLET BY MOUTH DAILY. 30 tablet 2   aspirin  EC 81 MG tablet Take 1  tablet (81 mg total) by mouth daily. Swallow whole. (Patient not taking: Reported on 03/30/2024)     atorvastatin  (LIPITOR) 40 MG tablet Take 1 tablet (40 mg total) by mouth daily. (Patient not taking: Reported on 03/30/2024) 90 tablet 0   oxymetazoline  (AFRIN) 0.05 % nasal spray Place 1 spray into right nostril 2 (two) times daily as needed (epistaxis). (Patient not taking: Reported on 03/30/2024)     No current facility-administered medications on file prior to visit.    Allergies:   Allergies  Allergen Reactions   Plavix  [Clopidogrel ] Rash    Physical Exam General: well developed, well nourished pleasant young African-American male, seated, in no evident distress Head: head normocephalic and atraumatic.  Neck: supple with no carotid or supraclavicular bruits Cardiovascular: regular rate and rhythm, no murmurs Musculoskeletal: no  deformity Skin:  no rash/petichiae Vascular:  Normal pulses all extremities Vitals:   03/30/24 1312 03/30/24 1320  BP: (!) 194/139 (!) 180/139  Pulse: 86    Neurologic Exam Mental Status: Awake and fully alert. Oriented to place and time. Recent and remote memory intact. Attention span, concentration and fund of knowledge appropriate. Mood and affect appropriate.  Cranial Nerves: Fundoscopic exam reveals sharp disc margins. Pupils equal, briskly reactive to light. Extraocular movements full without nystagmus. Visual fields full to confrontation. Hearing intact. Facial sensation intact. Face, tongue, palate moves normally and symmetrically.  Motor: Normal bulk and tone. Normal strength in all tested extremity muscles. Sensory.: intact to touch ,pinprick .position and vibratory sensation.  Coordination: Rapid alternating movements normal in all extremities. Finger-to-nose and heel-to-shin performed accurately bilaterally. Gait and Station: Arises from chair without difficulty. Stance is normal. Gait demonstrates normal stride length and balance . Able to heel, toe  and tandem walk without difficulty.  Reflexes: 1+ and symmetric. Toes downgoing.   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  Vascular risk factors of hypertension, smoking and mild hyperlipidemia only.     PLAN:I had a long d/w patient about his recent lacunar stroke,prior cryptogenic stroke, risk for recurrent stroke/TIAs, personally independently reviewed imaging studies and stroke evaluation results and answered questions.Continue aspirin  81 mg daily  for secondary stroke prevention and maintain strict control of hypertension with blood pressure goal below 130/90, diabetes with hemoglobin A1c goal below 6.5% and lipids with LDL cholesterol goal below 70 mg/dL. I also advised the patient to eat a healthy diet with plenty of whole grains, cereals, fruits and vegetables, exercise regularly and maintain ideal body weight .I have advised him to quit smoking completely.  I also advised him to wear his 30-day heart monitor to look for any paroxysmal A-fib.  Check lipoprotein a analysis today.  I advised him to keep his scheduled upcoming appointment to see his primary care physician and ask for subsequent refills of his medications from them.  Followup in the future with my nurse practitioner in 6 months or call earlier if necessary.    I personally spent a total of 50 minutes in the care of the patient today including getting/reviewing separately obtained history, performing a medically appropriate exam/evaluation, counseling and educating, placing orders, referring and communicating with other health care professionals, documenting clinical information in the EHR, independently interpreting results, and coordinating care.       Eather Popp, MD  Note: This document was prepared  with digital dictation and possible smart phrase technology. Any transcriptional errors that result from this process are unintentional

## 2024-03-30 NOTE — Patient Instructions (Signed)
 I had a long d/w patient about his recent lacunar stroke,prior cryptogenic stroke, risk for recurrent stroke/TIAs, personally independently reviewed imaging studies and stroke evaluation results and answered questions.Continue aspirin  81 mg daily  for secondary stroke prevention and maintain strict control of hypertension with blood pressure goal below 130/90, diabetes with hemoglobin A1c goal below 6.5% and lipids with LDL cholesterol goal below 70 mg/dL. I also advised the patient to eat a healthy diet with plenty of whole grains, cereals, fruits and vegetables, exercise regularly and maintain ideal body weight .I have advised him to quit smoking completely.  I also advised him to wear his 30-day heart monitor to look for any paroxysmal A-fib.  Check lipoprotein a analysis today.  I advised him to keep his scheduled upcoming appointment to see his primary care physician and ask for subsequent refills of his medications from them.  Followup in the future with my nurse practitioner in 6 months or call earlier if necessary.  Stroke Prevention Some medical conditions and behaviors can lead to a higher chance of having a stroke. You can help prevent a stroke by eating healthy, exercising, not smoking, and managing any medical conditions you have. Stroke is a leading cause of functional impairment. Primary prevention is particularly important because a majority of strokes are first-time events. Stroke changes the lives of not only those who experience a stroke but also their family and other caregivers. How can this condition affect me? A stroke is a medical emergency and should be treated right away. A stroke can lead to brain damage and can sometimes be life-threatening. If a person gets medical treatment right away, there is a better chance of surviving and recovering from a stroke. What can increase my risk? The following medical conditions may increase your risk of a stroke: Cardiovascular disease. High  blood pressure (hypertension). Diabetes. High cholesterol. Sickle cell disease. Blood clotting disorders (hypercoagulable state). Obesity. Sleep disorders (obstructive sleep apnea). Other risk factors include: Being older than age 67. Having a history of blood clots, stroke, or mini-stroke (transient ischemic attack, TIA). Genetic factors, such as race, ethnicity, or a family history of stroke. Smoking cigarettes or using other tobacco products. Taking birth control pills, especially if you also use tobacco. Heavy use of alcohol or drugs, especially cocaine and methamphetamine. Physical inactivity. What actions can I take to prevent this? Manage your health conditions High cholesterol levels. Eating a healthy diet is important for preventing high cholesterol. If cholesterol cannot be managed through diet alone, you may need to take medicines. Take any prescribed medicines to control your cholesterol as told by your health care provider. Hypertension. To reduce your risk of stroke, try to keep your blood pressure below 130/80. Eating a healthy diet and exercising regularly are important for controlling blood pressure. If these steps are not enough to manage your blood pressure, you may need to take medicines. Take any prescribed medicines to control hypertension as told by your health care provider. Ask your health care provider if you should monitor your blood pressure at home. Have your blood pressure checked every year, even if your blood pressure is normal. Blood pressure increases with age and some medical conditions. Diabetes. Eating a healthy diet and exercising regularly are important parts of managing your blood sugar (glucose). If your blood sugar cannot be managed through diet and exercise, you may need to take medicines. Take any prescribed medicines to control your diabetes as told by your health care provider. Get evaluated for obstructive sleep  apnea. Talk to your health  care provider about getting a sleep evaluation if you snore a lot or have excessive sleepiness. Make sure that any other medical conditions you have, such as atrial fibrillation or atherosclerosis, are managed. Nutrition Follow instructions from your health care provider about what to eat or drink to help manage your health condition. These instructions may include: Reducing your daily calorie intake. Limiting how much salt (sodium) you use to 1,500 milligrams (mg) each day. Using only healthy fats for cooking, such as olive oil, canola oil, or sunflower oil. Eating healthy foods. You can do this by: Choosing foods that are high in fiber, such as whole grains, and fresh fruits and vegetables. Eating at least 5 servings of fruits and vegetables a day. Try to fill one-half of your plate with fruits and vegetables at each meal. Choosing lean protein foods, such as lean cuts of meat, poultry without skin, fish, tofu, beans, and nuts. Eating low-fat dairy products. Avoiding foods that are high in sodium. This can help lower blood pressure. Avoiding foods that have saturated fat, trans fat, and cholesterol. This can help prevent high cholesterol. Avoiding processed and prepared foods. Counting your daily carbohydrate intake.  Lifestyle If you drink alcohol: Limit how much you have to: 0-1 drink a day for women who are not pregnant. 0-2 drinks a day for men. Know how much alcohol is in your drink. In the [place] one drink equals one 12 oz bottle of beer ( ), one 5 oz glass of wine ( ), or one 1 oz glass of hard liquor (44mL). Do not use any products that contain nicotine  or tobacco. These products include cigarettes, chewing tobacco, and vaping devices, such as e-cigarettes. If you need help quitting, ask your health care provider. Avoid secondhand smoke. Do not use drugs. Activity  Try to stay at a healthy weight. Get at least 30 minutes of exercise on most days, such as: Fast  walking. Biking. Swimming. Medicines Take over-the-counter and prescription medicines only as told by your health care provider. Aspirin  or blood thinners (antiplatelets or anticoagulants) may be recommended to reduce your risk of forming blood clots that can lead to stroke. Avoid taking birth control pills. Talk to your health care provider about the risks of taking birth control pills if: You are over 53 years old. You smoke. You get very bad headaches. You have had a blood clot. Where to find more information American Stroke Association: www.strokeassociation.org Get help right away if: You or a loved one has any symptoms of a stroke. BE FAST is an easy way to remember the main warning signs of a stroke: B - Balance. Signs are dizziness, sudden trouble walking, or loss of balance. E - Eyes. Signs are trouble seeing or a sudden change in vision. F - Face. Signs are sudden weakness or numbness of the face, or the face or eyelid drooping on one side. A - Arms. Signs are weakness or numbness in an arm. This happens suddenly and usually on one side of the body. S - Speech. Signs are sudden trouble speaking, slurred speech, or trouble understanding what people say. T - Time. Time to call emergency services. Write down what time symptoms started. You or a loved one has other signs of a stroke, such as: A sudden, severe headache with no known cause. Nausea or vomiting. Seizure. These symptoms may represent a serious problem that is an emergency. Do not wait to see if the symptoms will go away. Get medical  help right away. Call your local emergency services (911 in the [place] Do not drive yourself to the hospital. Summary You can help to prevent a stroke by eating healthy, exercising, not smoking, limiting alcohol intake, and managing any medical conditions you may have. Do not use any products that contain nicotine  or tobacco. These include cigarettes, chewing tobacco, and vaping devices,  such as e-cigarettes. If you need help quitting, ask your health care provider. Remember BE FAST for warning signs of a stroke. Get help right away if you or a loved one has any of these signs. This information is not intended to replace advice given to you by your health care provider. Make sure you discuss any questions you have with your health care provider. Document Revised: 07/13/2022 Document Reviewed: 07/13/2022 Elsevier Patient Education  2024 [employer].

## 2024-04-02 LAB — LIPOPROTEIN A (LPA): Lipoprotein (a): <8.4 nmol/L (ref <75.0)

## 2024-04-06 ENCOUNTER — Other Ambulatory Visit: Payer: Self-pay

## 2024-04-07 ENCOUNTER — Other Ambulatory Visit: Payer: Self-pay

## 2024-04-07 ENCOUNTER — Ambulatory Visit: Payer: Self-pay | Admitting: Neurology

## 2024-04-10 NOTE — Telephone Encounter (Signed)
 Attempted to call the patient and there was no answer. Unable to LVM due to vm box was full.

## 2024-04-12 ENCOUNTER — Other Ambulatory Visit: Payer: Self-pay

## 2024-04-12 MED ORDER — CETIRIZINE HCL 10 MG PO TABS
10.0000 mg | ORAL_TABLET | Freq: Every day | ORAL | 0 refills | Status: AC
  Filled 2024-04-12: qty 30, 30d supply, fill #0

## 2024-04-12 MED ORDER — NICOTINE 7 MG/24HR TD PT24
7.0000 mg | MEDICATED_PATCH | Freq: Every day | TRANSDERMAL | 1 refills | Status: AC
  Filled 2024-04-12: qty 28, 28d supply, fill #0

## 2024-04-13 ENCOUNTER — Encounter: Payer: Self-pay | Admitting: Neurology

## 2024-04-13 NOTE — Telephone Encounter (Signed)
-----   Message from Estancia A sent at 04/12/2024  8:32 AM EDT -----  ----- Message ----- From: Oneita Hoist, CMA Sent: 04/10/2024  11:48 AM EDT To: [facility] Phone Room  Please relay normal result  ----- Message ----- From: Rosemarie Eather RAMAN, MD Sent: 04/07/2024   3:48 PM EDT To: [facility] Results

## 2024-04-13 NOTE — Telephone Encounter (Signed)
 Attempted 3rd attempt to contact the pt. There was no answer. Unable to LVM due to full VM. Will send a letter to address on file.

## 2024-04-18 ENCOUNTER — Other Ambulatory Visit: Payer: Self-pay

## 2024-10-26 ENCOUNTER — Ambulatory Visit: Admitting: Adult Health
